# Patient Record
Sex: Female | Born: 1980 | Race: White | Hispanic: No | Marital: Married | Smoking: Former smoker
Health system: Southern US, Community
[De-identification: ages and names within clinical notes are randomized; demographics above are authoritative.]

## PROBLEM LIST (undated history)

## (undated) DIAGNOSIS — I10 Essential (primary) hypertension: Secondary | ICD-10-CM

## (undated) DIAGNOSIS — G43909 Migraine, unspecified, not intractable, without status migrainosus: Secondary | ICD-10-CM

## (undated) DIAGNOSIS — C801 Malignant (primary) neoplasm, unspecified: Secondary | ICD-10-CM

## (undated) DIAGNOSIS — E079 Disorder of thyroid, unspecified: Secondary | ICD-10-CM

## (undated) HISTORY — PX: CHOLECYSTECTOMY: SHX55

## (undated) HISTORY — PX: BREAST BIOPSY: SHX20

## (undated) HISTORY — PX: FRACTURE SURGERY: SHX138

## (undated) HISTORY — PX: NO PAST SURGERIES: SHX2092

## (undated) HISTORY — PX: ABDOMINAL HYSTERECTOMY: SHX81

## (undated) HISTORY — PX: ANKLE SURGERY: SHX546

## (undated) HISTORY — DX: Disorder of thyroid, unspecified: E07.9

## (undated) HISTORY — PX: SPINE SURGERY: SHX786

## (undated) HISTORY — PX: SHOULDER SURGERY: SHX246

## (undated) HISTORY — PX: CERVICAL SPINE SURGERY: SHX589

## (undated) HISTORY — PX: HX HYSTERECTOMY: SHX81

---
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
## 2006-09-25 HISTORY — PX: ANTERIOR FUSION CERVICAL SPINE: SUR626

## 2013-05-17 ENCOUNTER — Emergency Department (HOSPITAL_COMMUNITY)
Admission: EM | Admit: 2013-05-17 | Disposition: A | Discharge status: Home / Self Care | Payer: Medicaid - Out of State | Attending: Family | Admitting: Family

## 2013-05-17 ENCOUNTER — Emergency Department (EMERGENCY_DEPARTMENT_HOSPITAL): Payer: Medicaid - Out of State

## 2014-08-10 ENCOUNTER — Emergency Department (HOSPITAL_COMMUNITY): Payer: Self-pay | Admitting: Emergency Medicine

## 2014-08-14 ENCOUNTER — Encounter (HOSPITAL_COMMUNITY): Payer: Medicaid - Out of State

## 2014-08-14 DIAGNOSIS — M542 Cervicalgia: Secondary | ICD-10-CM

## 2014-08-14 DIAGNOSIS — M79602 Pain in left arm: Secondary | ICD-10-CM

## 2015-05-15 ENCOUNTER — Emergency Department (HOSPITAL_COMMUNITY): Payer: Self-pay | Admitting: EXTERNAL

## 2016-09-25 HISTORY — PX: ABDOMINAL HYSTERECTOMY: SHX81

## 2017-05-08 ENCOUNTER — Emergency Department
Admission: EM | Admit: 2017-05-08 | Discharge: 2017-05-08 | Disposition: A | Discharge status: Home / Self Care | Payer: BC Managed Care – PPO | Attending: Surgery | Admitting: Surgery

## 2017-05-08 ENCOUNTER — Encounter (HOSPITAL_COMMUNITY): Payer: Self-pay

## 2017-05-08 ENCOUNTER — Emergency Department (EMERGENCY_DEPARTMENT_HOSPITAL): Payer: BC Managed Care – PPO

## 2017-05-08 DIAGNOSIS — R200 Anesthesia of skin: Secondary | ICD-10-CM | POA: Insufficient documentation

## 2017-05-08 DIAGNOSIS — M542 Cervicalgia: Secondary | ICD-10-CM

## 2017-05-08 DIAGNOSIS — I10 Essential (primary) hypertension: Secondary | ICD-10-CM | POA: Insufficient documentation

## 2017-05-08 DIAGNOSIS — F17210 Nicotine dependence, cigarettes, uncomplicated: Secondary | ICD-10-CM | POA: Insufficient documentation

## 2017-05-08 DIAGNOSIS — R109 Unspecified abdominal pain: Secondary | ICD-10-CM | POA: Insufficient documentation

## 2017-05-08 DIAGNOSIS — Z79899 Other long term (current) drug therapy: Secondary | ICD-10-CM | POA: Insufficient documentation

## 2017-05-08 DIAGNOSIS — R32 Unspecified urinary incontinence: Secondary | ICD-10-CM | POA: Insufficient documentation

## 2017-05-08 DIAGNOSIS — Z981 Arthrodesis status: Secondary | ICD-10-CM | POA: Insufficient documentation

## 2017-05-08 DIAGNOSIS — R0602 Shortness of breath: Secondary | ICD-10-CM | POA: Insufficient documentation

## 2017-05-08 DIAGNOSIS — Z79891 Long term (current) use of opiate analgesic: Secondary | ICD-10-CM | POA: Insufficient documentation

## 2017-05-08 DIAGNOSIS — R112 Nausea with vomiting, unspecified: Secondary | ICD-10-CM | POA: Insufficient documentation

## 2017-05-08 DIAGNOSIS — T1490XA Injury, unspecified, initial encounter: Secondary | ICD-10-CM | POA: Diagnosis present

## 2017-05-08 HISTORY — DX: Essential (primary) hypertension: I10

## 2017-05-08 LAB — CBC WITH DIFF
BASOPHIL #: 0.06 x10ˆ3/uL (ref 0.00–0.20)
BASOPHIL %: 1 %
EOSINOPHIL #: 0.23 x10ˆ3/uL (ref 0.00–0.50)
EOSINOPHIL %: 2 %
EOSINOPHIL %: 2 %
HCT: 40.7 % (ref 33.5–45.2)
HGB: 14.1 g/dL (ref 11.2–15.2)
LYMPHOCYTE #: 3.05 x10ˆ3/uL (ref 1.00–4.80)
LYMPHOCYTE %: 30 %
MCH: 32.3 pg (ref 27.4–33.0)
MCHC: 34.7 g/dL (ref 32.5–35.8)
MCV: 93.3 fL (ref 78.0–100.0)
MONOCYTE #: 0.79 x10ˆ3/uL (ref 0.30–1.00)
MONOCYTE %: 8 %
MPV: 8.6 fL (ref 7.5–11.5)
NEUTROPHIL #: 6.22 x10ˆ3/uL (ref 1.50–7.70)
NEUTROPHIL %: 60 %
PLATELETS: 324 x10ˆ3/uL (ref 140–450)
RBC: 4.36 x10ˆ6/uL (ref 3.63–4.92)
RDW: 13.9 % (ref 12.0–15.0)
WBC: 10.3 x10ˆ3/uL (ref 3.5–11.0)

## 2017-05-08 LAB — B-TYPE NATRIURETIC PEPTIDE (BNP),PLASMA: BNP: 39 pg/mL (ref <=100)

## 2017-05-08 LAB — HCG, SERUM QUALITATIVE, PREGNANCY
PREGNANCY, SERUM QUALITATIVE: NEGATIVE
PREGNANCY, SERUM QUALITATIVE: NEGATIVE

## 2017-05-08 MED ORDER — TIZANIDINE 4 MG TABLET
4.00 mg | ORAL_TABLET | ORAL | Status: AC
Start: 2017-05-08 — End: 2017-05-08
  Administered 2017-05-08 (×2): 4 mg via ORAL
  Filled 2017-05-08: qty 1

## 2017-05-08 MED ORDER — ONDANSETRON HCL (PF) 4 MG/2 ML INJECTION SOLUTION
4.0000 mg | INTRAMUSCULAR | Status: AC
Start: 2017-05-08 — End: 2017-05-08
  Administered 2017-05-08: 4 mg via INTRAVENOUS
  Filled 2017-05-08: qty 2

## 2017-05-08 MED ORDER — DIAZEPAM 5 MG TABLET
5.00 mg | ORAL_TABLET | ORAL | Status: AC
Start: 2017-05-08 — End: 2017-05-08
  Administered 2017-05-08: 5 mg via ORAL
  Filled 2017-05-08: qty 1

## 2017-05-08 MED ORDER — SODIUM CHLORIDE 0.9 % IV BOLUS
1000.0000 mL | INJECTION | Status: AC
Start: 2017-05-08 — End: 2017-05-08
  Administered 2017-05-08: 0 mL via INTRAVENOUS
  Administered 2017-05-08: 1000 mL via INTRAVENOUS

## 2017-05-08 MED ORDER — DEXAMETHASONE SODIUM PHOSPHATE 10 MG/ML INJECTION SOLUTION
10.00 mg | INTRAMUSCULAR | Status: AC
Start: 2017-05-08 — End: 2017-05-08
  Administered 2017-05-08: 10 mg via INTRAVENOUS
  Filled 2017-05-08: qty 1

## 2017-05-08 MED ORDER — KETOROLAC 30 MG/ML (1 ML) INJECTION SOLUTION
30.00 mg | INTRAMUSCULAR | Status: AC
Start: 2017-05-08 — End: 2017-05-08
  Administered 2017-05-08: 30 mg via INTRAVENOUS
  Filled 2017-05-08: qty 1

## 2017-05-08 MED ORDER — MORPHINE 4 MG/ML INTRAVENOUS CARTRIDGE
4.00 mg | CARTRIDGE | INTRAVENOUS | Status: AC
Start: 2017-05-08 — End: 2017-05-08
  Administered 2017-05-08: 4 mg via INTRAVENOUS
  Filled 2017-05-08: qty 1

## 2017-05-08 MED ORDER — KETAMINE 10 MG/ML INJECTION WRAPPER
0.1500 mg/kg | Freq: Once | INTRAMUSCULAR | Status: AC
Start: 2017-05-08 — End: 2017-05-08
  Filled 2017-05-08: qty 3

## 2017-05-08 MED ADMIN — sodium chloride 0.9 % (flush) injection syringe: INTRAVENOUS | @ 09:00:00

## 2017-05-08 NOTE — Nurses Notes (Signed)
Pt cleared for mri . States she is unable to lay flat do to pain. Bedside RN aware and states will discuss with ordering service about premed

## 2017-05-08 NOTE — ED Nurses Note (Signed)
Patient family at nurses station again about patient severe pain. MD young notified again.

## 2017-05-08 NOTE — ED Nurses Note (Signed)
Aspen c-collar placed. Pt states, "I can't wear this. I am refusing to wear this collar. It is too painful." C-collar removed.

## 2017-05-08 NOTE — ED Nurses Note (Signed)
Pt cleared for MRI.

## 2017-05-08 NOTE — ED Nurses Note (Signed)
Trauma resident offered Lidocaine patch.
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
## 2017-05-08 NOTE — ED Nurses Note (Signed)
SBAR report received from A. Deem, RN. Assumed patient care at this time.

## 2017-05-08 NOTE — ED Nurses Note (Signed)
Patient reports no relief from pain with toradol. MD young made aware. Orders for ketamine given.

## 2017-05-08 NOTE — ED Nurses Note (Signed)
Trauma at bedside for consultation.
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
## 2017-05-08 NOTE — ED Nurses Note (Signed)
Pt refused EKG. RN offered to have ED manager speak with pt. ED Manager not available at this time. Pt advocate notified of need. Charge RN aware of pt complaint.
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
## 2017-05-08 NOTE — ED Nurses Note (Signed)
Manual BP taken. Trauma paged. Awaiting return call. C/o chest pain that is radiating down the left arm. Pt is angry. States, "Make sure you document that for when I DO die so you can cover your own butt."

## 2017-05-08 NOTE — ED Nurses Note (Signed)
Patient reporting pain 9/10. MD young made aware.

## 2017-05-08 NOTE — ED Nurses Note (Signed)
MD young at bedside to administer ketamine.
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
## 2017-05-08 NOTE — ED Nurses Note (Signed)
Trauma paged at pt request for Dilaudid for pain. New ice pack given for left hand. Pt laying with pillow propped under left back. Pillow and towel under neck. Husband at bedside.

## 2017-05-08 NOTE — ED Nurses Note (Signed)
Patient assisted to bathroom via wheelchair by husband.

## 2017-05-08 NOTE — ED Nurses Note (Signed)
1410: Patient advocate paged for services.

## 2017-05-08 NOTE — Nurses Notes (Signed)
Emergency Service paged for plan of completion for pt

## 2017-05-08 NOTE — ED Nurses Note (Signed)
Report given to Vernona RiegerLaura RN upon patient moved from 19 to CDU. All questions answered.
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
## 2017-05-08 NOTE — ED Nurses Note (Signed)
Pt was resting peacefully when moved from ED room 19. Ketamine administered prior to move.

## 2017-05-08 NOTE — ED Nurses Note (Signed)
MD young at bedside to evaluate the patient.

## 2017-05-08 NOTE — ED Nurses Note (Signed)
Trauma, Ortho, ED residents aware of pts subjective pain level. Pt aware she is awaiting MRI. No further needs at this time. States, "I just need something you won't give."
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
## 2017-05-08 NOTE — ED Nurses Note (Signed)
[facility]. Care assumed by Ronaldo MiyamotoKyle RN at this time.

## 2017-05-08 NOTE — ED Nurses Note (Signed)
Pt tearful and rolling around in bed. Blanket rolled and tucked under neck. Pillow given for head. Pillow placed under right side. Pt is right side laying. Cannot tolerate laying supine. Refuses any other position change. Thermawrap applied to neck. Cold pack applied to left hand with washcloth upon request. Husband at bedside is pacing and upset. Dr. Daphine DeutscherMartin aware.

## 2017-05-08 NOTE — ED Nurses Note (Signed)
ED Resident at bedside to assess pain.

## 2017-05-08 NOTE — ED Nurses Note (Signed)
Pt pulled IV from arm. Gathered up belongings and ambulated out of ED with husband. States, "I'm out of here. You aren't doing anything for me. I'm out." Trauma paged.

## 2017-05-08 NOTE — ED Nurses Note (Signed)
EKG tech called and notified of need for EKG.
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
## 2017-05-13 ENCOUNTER — Emergency Department (HOSPITAL_COMMUNITY): Payer: BC Managed Care – PPO

## 2017-05-13 ENCOUNTER — Emergency Department
Admission: EM | Admit: 2017-05-13 | Discharge: 2017-05-14 | Disposition: A | Discharge status: Home / Self Care | Payer: BC Managed Care – PPO | Attending: Emergency Medicine | Admitting: Emergency Medicine

## 2017-05-13 ENCOUNTER — Encounter (HOSPITAL_COMMUNITY): Payer: Self-pay

## 2017-05-13 DIAGNOSIS — M5410 Radiculopathy, site unspecified: Secondary | ICD-10-CM

## 2017-05-13 DIAGNOSIS — W1781XA Fall down embankment (hill), initial encounter: Secondary | ICD-10-CM | POA: Insufficient documentation

## 2017-05-13 DIAGNOSIS — W19XXXA Unspecified fall, initial encounter: Secondary | ICD-10-CM

## 2017-05-13 DIAGNOSIS — M5416 Radiculopathy, lumbar region: Secondary | ICD-10-CM | POA: Insufficient documentation

## 2017-05-13 DIAGNOSIS — Z79899 Other long term (current) drug therapy: Secondary | ICD-10-CM | POA: Insufficient documentation

## 2017-05-13 DIAGNOSIS — I10 Essential (primary) hypertension: Secondary | ICD-10-CM | POA: Insufficient documentation

## 2017-05-13 DIAGNOSIS — M545 Low back pain, unspecified: Secondary | ICD-10-CM

## 2017-05-13 DIAGNOSIS — S161XXA Strain of muscle, fascia and tendon at neck level, initial encounter: Secondary | ICD-10-CM

## 2017-05-13 LAB — URINALYSIS, MACRO/MICRO
BILIRUBIN: NEGATIVE mg/dL
BLOOD: NEGATIVE mg/dL
GLUCOSE: NEGATIVE mg/dL
KETONES: NEGATIVE mg/dL
LEUKOCYTES: NEGATIVE WBCs/uL
NITRITE: POSITIVE — AB
NITRITE: POSITIVE — AB
PH: 6.5 (ref 5.0–7.0)
PROTEIN: NEGATIVE mg/dL
SPECIFIC GRAVITY: 1.011 (ref 1.010–1.025)
UROBILINOGEN: 0.2 mg/dL

## 2017-05-13 LAB — BASIC METABOLIC PANEL
ANION GAP: 8 mmol/L
BUN/CREA RATIO: 22
BUN: 14 mg/dL (ref 10–25)
CALCIUM: 8.8 mg/dL (ref 8.2–10.2)
CHLORIDE: 105 mmol/L (ref 98–111)
CO2 TOTAL: 25 mmol/L (ref 21–35)
CREATININE: 0.63 mg/dL (ref <=1.30)
ESTIMATED GFR: >60 mL/min/1.73mˆ2
GLUCOSE: 85 mg/dL (ref 70–110)
POTASSIUM: 3.9 mmol/L (ref 3.5–5.0)
SODIUM: 138 mmol/L (ref 135–145)

## 2017-05-13 LAB — URINE DRUG SCREEN
AMPHETAMINES URINE: NEGATIVE
BARBITURATES URINE: NEGATIVE
CANNABINOIDS URINE: NEGATIVE
COCAINE METABOLITES URINE: NEGATIVE
OPIATES URINE: NEGATIVE
PCP URINE: NEGATIVE
PCP URINE: NEGATIVE

## 2017-05-13 LAB — CBC WITH DIFF
BASOPHIL #: 0.1 10*3/uL (ref 0.00–0.20)
BASOPHIL %: 1 %
EOSINOPHIL #: 0.3 10*3/uL (ref 0.00–0.50)
EOSINOPHIL %: 2 %
HCT: 38 % (ref 34.6–46.2)
HGB: 12.8 g/dL (ref 11.8–15.8)
LYMPHOCYTE #: 3.8 10*3/uL — ABNORMAL HIGH (ref 0.90–3.40)
LYMPHOCYTE %: 23 %
LYMPHOCYTE %: 23 %
MCH: 32.1 pg (ref 27.6–33.2)
MCHC: 33.7 g/dL (ref 32.6–35.4)
MCV: 95.5 fL (ref 82.3–96.7)
MCV: 95.5 fL (ref 82.3–96.7)
MONOCYTE #: 0.8 10*3/uL (ref 0.20–0.90)
MONOCYTE #: 0.8 10*3/uL (ref 0.20–0.90)
MONOCYTE %: 5 %
MPV: 8.9 fL (ref 6.6–10.2)
NEUTROPHIL #: 12 10*3/uL — ABNORMAL HIGH (ref 1.50–6.40)
NEUTROPHIL %: 70 %
PLATELETS: 297 10*3/uL (ref 140–440)
RBC: 3.98 10*6/uL (ref 3.80–5.24)
RBC: 3.98 x10?6/uL (ref 3.80–5.24)
RDW: 14.2 % (ref 12.4–15.2)
WBC: 17.1 10*3/uL — ABNORMAL HIGH (ref 3.5–10.3)

## 2017-05-13 LAB — URINE DRUG SCREEN-PAIN CLINIC (NO THC): BENZODIAZEPINES URINE: NEGATIVE

## 2017-05-13 LAB — ETHANOL, SERUM/PLASMA: ETHANOL: <10 mg/dL (ref 0–10)

## 2017-05-13 MED ORDER — METHYLPREDNISOLONE 4 MG TABLETS IN A DOSE PACK: Tab | ORAL | 0 refills | 0 days | Status: AC

## 2017-05-13 MED ORDER — LIDOCAINE 4 % TOPICAL PATCH
1.00 | MEDICATED_PATCH | CUTANEOUS | Status: DC
Start: 2017-05-13 — End: 2017-05-13

## 2017-05-13 MED ORDER — IBUPROFEN 600 MG TABLET: 600 mg | Tab | Freq: Four times a day (QID) | ORAL | 0 refills | 0 days | Status: AC | PRN

## 2017-05-13 MED ORDER — FENTANYL (PF) 50 MCG/ML INJECTION SOLUTION
25.00 ug | INTRAMUSCULAR | Status: AC
Start: 2017-05-13 — End: 2017-05-13
  Administered 2017-05-13: 25 ug via INTRAVENOUS
  Filled 2017-05-13: qty 2

## 2017-05-13 MED ORDER — ONDANSETRON HCL (PF) 4 MG/2 ML INJECTION SOLUTION
4.00 mg | INTRAMUSCULAR | Status: AC
Start: 2017-05-14 — End: 2017-05-13
  Administered 2017-05-13: 4 mg via INTRAVENOUS
  Filled 2017-05-13: qty 2

## 2017-05-13 MED ORDER — LIDOCAINE 5 % TOPICAL PATCH
1.0000 | MEDICATED_PATCH | Freq: Once | CUTANEOUS | Status: DC
Start: 2017-05-14 — End: 2017-05-14
  Administered 2017-05-13: 700 mg via TRANSDERMAL
  Filled 2017-05-13: qty 1

## 2017-05-13 MED ORDER — FENTANYL (PF) 50 MCG/ML INJECTION SOLUTION
25.00 ug | INTRAMUSCULAR | Status: AC
Start: 2017-05-13 — End: 2017-05-13
  Administered 2017-05-13: 25 ug via INTRAVENOUS

## 2017-05-13 MED ORDER — KETOROLAC 30 MG/ML (1 ML) INJECTION SOLUTION
30.0000 mg | INTRAMUSCULAR | Status: AC
Start: 2017-05-13 — End: 2017-05-13
  Administered 2017-05-13: 30 mg via INTRAVENOUS
  Filled 2017-05-13: qty 1

## 2017-05-13 MED ORDER — GABAPENTIN 300 MG CAPSULE: Cap | ORAL | 0 refills | 0 days | Status: AC

## 2017-05-13 MED ORDER — METHOCARBAMOL 100 MG/ML INJECTION SOLUTION
1.00 g | INTRAMUSCULAR | Status: AC
Start: 2017-05-13 — End: 2017-05-13
  Administered 2017-05-13: 1000 mg via INTRAVENOUS
  Filled 2017-05-13: qty 1

## 2017-05-13 MED ORDER — CYCLOBENZAPRINE 5 MG TABLET: 5 mg | Tab | Freq: Three times a day (TID) | ORAL | 0 refills | 0 days | Status: DC | PRN

## 2017-05-13 MED ADMIN — ketorolac 30 mg/mL (1 mL) injection solution: INTRAVENOUS | @ 22:00:00

## 2017-05-13 MED ADMIN — fentaNYL (PF) 50 mcg/mL injection solution: INTRAVENOUS | @ 21:00:00

## 2017-05-13 MED ADMIN — sodium chloride 0.9 % (flush) injection syringe: INTRAVENOUS

## 2017-05-13 NOTE — ED Nurses Note (Signed)
pt resting eyes closed.

## 2017-05-13 NOTE — ED Provider Notes (Signed)
Department of Emergency Medicine  [facility]        HPI: Patient is a 36 y.o.  female presenting to the ED with chief complaint of fall from standing on a hill with low back pain. Sipped, did not have dizziness or syncope leading to fall. Landed on her back on a steep embankment, but did not roll/fall down a significant height. Complains of pain to entire back. Complains that it feels like ants are biting entire BLE and buttocks. Received 100 mcg fentanyl en route. Denies LOC. Denies CP, SOB, abdominal pain. Reports history of prior back fractures "in three places" including lumbar spine. Had C-spine surgery, but not lumbar surgery. Has chronic neck pain and back pain, but pain feels worse than baseline. Denies intoxication. Denies incontinence. Has not ambulated or voided since the fall.      Review of Systems:     Constitutional: No fever, chills  Skin: No rashes or lesions  HENT: No sore throat, ear pain, or difficulty swallowing  Eyes: No vision changes, redness, discharge  Cardio: No chest pain, palpitations   Respiratory: No cough, wheezing or SOB  GI:  No nausea or vomiting. No diarrhea or constipation. No abdominal pain  GU:  No dysuria, hematuria, polyuria  MSK: + Back pain, neck pain  Neuro: Not numbness but + BLE tingling/biting pain.  No headache  Psych: No SI or HI. Denies substance abuse.     All other symptoms reviewed and are negative, unless commented on in the HPI.     Past Medical History:  Past Medical History:   Diagnosis Date   . HTN (hypertension)          Past Surgical History:    Past Surgical History:   Procedure Laterality Date   . CERVICAL SPINE SURGERY     . HX HYSTERECTOMY           Allergies:  Allergies   Allergen Reactions   . Aspirin Anaphylaxis     Medications:    Current Outpatient Prescriptions   Medication Sig   . atenolol (TENORMIN) 50 mg Oral Tablet Take 50 mg by mouth Once a day   . lisinopril (PRINIVIL) 20 mg Oral Tablet Take 20 mg by mouth Once a day     Social  History:    Social History   Substance Use Topics   . Smoking status: Never Smoker   . Smokeless tobacco: Never Used   . Alcohol use 1.2 oz/week     2 Glasses of wine per week     Family History: No significant family history.  Family Medical History     None              PMH, PSH, medications, allergies, SH, and FH per medical record and reviewed with patient. Important aspects of these fields pertaining to today's visit taken into consideration during history/physical and MDM.    ED Triage Vitals   Enc Vitals Group      BP (Non-Invasive) 05/13/17 2000 149/89      Heart Rate 05/13/17 2000 97      Respiratory Rate 05/13/17 2000 18      Temperature 05/13/17 2000 36.9 C (98.4 F)      Temp src --       SpO2-1 05/13/17 2000 99 %      Weight 05/13/17 2000 97.5 kg (215 lb)      Height 05/13/17 2000 1.753 m (5\' 9" )      Head  Cir --       Peak Flow --       Pain Score --       Pain Loc --       Pain Edu? --       Excl. in [place]? --      Filed Vitals:    05/13/17 2000   BP: (!) 149/89   Pulse: 97   Resp: 18   Temp: 36.9 C (98.4 F)   SpO2: 99%       Physical Exam:     Nursing note and vitals reviewed.  Vital signs reviewed as above. Moaning.   Constitutional: Pt is well-developed and well-nourished.   Head: Normocephalic and atraumatic.   Eyes: Conjunctivae are normal. Pupils are equal, round, and reactive to light. EOM are intact  Neck: + Midline c-spine TTP.   Cardiovascular: RRR.  No Murmurs/rubs/gallops. Distal pulses present and equal bilaterally.  Pulmonary/Chest: Normal BS BL with no distress. No audible wheezes or crackles are noted.  Abdominal: Soft, nontender, nondistended.  No rebound, guarding, or masses.  Musculoskeletal: Normal range of motion of extremities. No deformities.  Exhibits no edema. + Lumbar midline TTP. Internal/external hip rotation without pain.   Neurological: Preferring to keep eyes closed. Moaning, somewhat slow/mumbling/slurred speech. Oriented x3. Face symmetric. 5/5 strength and sensation  intact in bilateral radial, median, and ulnar distribution. 5/5 hip flexion bilaterally. 5/5 knee extension. 5/5 foot dorsi and plantarflexion bilaterally. SILT BLE, but states tingling/pain. Normal rectal tone. Patellar reflexes intact.   Skin: Warm and dry. No rash or lesions  Psychiatric: Patient has exaggerated affect.     Workup:   Orders Placed This Encounter   . CANCELED: URINE CULTURE,ROUTINE   . CT BRAIN WO IV CONTRAST   . CT CERVICAL SPINE WO IV CONTRAST   . CT LUMBAR SPINE WO IV CONTRAST   . BASIC METABOLIC PANEL   . CBC/DIFF   . URINALYSIS WITH REFLEX MICROSCOPIC AND CULTURE IF POSITIVE   . URINE DRUG SCREEN   . CBC WITH DIFF   . URINALYSIS, MACRO/MICRO   . ETHANOL, SERUM   . Refer to [facility]   . ECG 12 LEAD - ED USE   . CANCELED: ECG 12 LEAD - ED USE   . fentaNYL (SUBLIMAZE) 50 mcg/mL injection   . ketorolac (TORADOL) 30 mg/mL injection   . fentaNYL (SUBLIMAZE) 50 mcg/mL injection   . methocarbamol (ROBAXIN) 100 mg/mL injection   . ondansetron (ZOFRAN) 2 mg/mL injection   . gabapentin (NEURONTIN) 300 mg Oral Capsule   . Methylprednisolone (MEDROL DOSEPACK) 4 mg Oral Tablets, Dose Pack   . Ibuprofen (MOTRIN) 600 mg Oral Tablet   . morphine 2 mg/mL injection   . lidocaine (LIDODERM) 5 % Adhesive Patch, Medicated   . traMADol (ULTRAM) 50 mg Oral Tablet   . methocarbamol (ROBAXIN) 500 mg Oral Tablet         ECG: NSR       Imaging:      Results for orders placed or performed during the hospital encounter of 05/13/17   CT BRAIN WO IV CONTRAST     Status: None    Narrative    Meliss Plude    PROCEDURE DESCRIPTION: CT BRAIN WO IV CONTRAST    PROCEDURE PERFORMED DATE AND TIME: 05/13/2017 10:13 PM    CT DOSE INFORMATION:  DLP (mGycm):  1155    CLINICAL INDICATION: fall on hill, somewhat slurred speech    COMPARISON: No prior studies were  compared.      FINDINGS: Brain volume, particular size and sulcal pattern are within  normal range. No hydrocephalus, intracranial hemorrhage, mass effect or  midline shift.  Patent basal cisterns.    The temporal bone structures and paranasal sinuses are largely clear. No  depressed or widely separated calvarial fracture.      Impression    1. No intracranial hemorrhage or mass effect.            The CT exam was performed using one or more the following a dose reduction  techniques: Automated exposure control, adjustment of the mA and/or kV  according to the patient's size, or use of iterative reconstruction  technique.     CT CERVICAL SPINE WO IV CONTRAST     Status: None    Narrative    Nastasia Fizer    PROCEDURE DESCRIPTION: CT CERVICAL SPINE WO IV CONTRAST    PROCEDURE PERFORMED DATE AND TIME: 05/13/2017 10:17 PM    CT DOSE INFORMATION:  DLP (mGycm):  539    CLINICAL INDICATION: fall on hill, neck pain    COMPARISON: No prior studies were compared.      FINDINGS: Postoperative changes status post C3-C4 anterior cervical  discectomy and fusion. Reversal of the normal cervical lordosis, stable  from x-ray cervical spine 05/17/2013. Multilevel cervical spine degenerative  changes with associated endplate osteophytes, loss of intervertebral disc  space height and facet arthropathy. No evidence for acute fracture or  traumatic malalignment.      Impression    1. Postoperative and degenerative changes cervical spine without acute  fracture or malalignment.  2. Patchy nonspecific airspace opacities within the bilateral upper lobes,  greater on the right.            The CT exam was performed using one or more the following a dose reduction  techniques: Automated exposure control, adjustment of the mA and/or kV  according to the patient's size, or use of iterative reconstruction  technique.     CT LUMBAR SPINE WO IV CONTRAST     Status: None    Narrative    Lindsee Cumberland    PROCEDURE DESCRIPTION: CT LUMBAR SPINE WO IV CONTRAST    PROCEDURE PERFORMED DATE AND TIME: 05/13/2017 10:21 PM    CT DOSE INFORMATION:  DLP (mGycm):  2439    CLINICAL INDICATION: fall on hill, lumbar back pain with pins/needles  to  entire BLE and buttock    COMPARISON: No prior studies were compared.      FINDINGS: Mild L4 vertebral body height loss and superior endplate  irregularity. Approximately 25% height loss L4 superior endplate  compression fracture.    Moderate L3 compression fracture deformity with associated superior  endplate irregularity. Approximately 50% height loss L3 vertebral body.    The L3 and L4 compression fracture deformities are not associated with  prevertebral or paravertebral soft tissue swelling, the fractures may be  chronic in nature. No significant bony canal stenosis at L4 or L3.    L2-L3: Mild disc bulge and facet arthropathy. Minimal effacement of the  thecal sac. No significant neural foraminal narrowing.    L3-L4: Mild disc bulge and facet arthropathy. Mild central canal stenosis  and neural foraminal narrowing.    L4-L5: Mild disc bulge. Minimal effacement of ventral thecal sac.    Fecal retention within the colon.      Impression    1. L3 and L4 compression fracture deformities, favor a chronic compression  fracture deformities.  Correlation with any prior imaging of the lumbar  spine recommended. No definite secondary signs to suggest acute fractures.  If the clinical suspicion for acute L3 or L4 compression fracture remains,  further assessment with MRI may be helpful to assess for bone marrow edema.            The CT exam was performed using one or more the following a dose reduction  techniques: Automated exposure control, adjustment of the mA and/or kV  according to the patient's size, or use of iterative reconstruction  technique.             Abnormal Lab results:  Labs Reviewed   CBC WITH DIFF - Abnormal; Notable for the following:        Result Value    WBC 17.1 (*)     NEUTROPHIL # 12.00 (*)     LYMPHOCYTE # 3.80 (*)     All other components within normal limits   URINALYSIS, MACRO/MICRO - Abnormal; Notable for the following:     NITRITE Positive (*)     RBCS 0-2 (*)     All other components  within normal limits   URINE DRUG SCREEN - Normal    Narrative:     Unconfirmed screening results must not be used for non-medical purposes   (e.g. employment testing, legal testing, etc.)    Cut-off values are listed below:  Amphetamines - < 1000 ng/mL  Barbiturates - < 200 ng/mL  Benzodiazepines - < 200 ng/mL  Cocaine - < 300 ng/mL  Opiate - < 300 ng/mL  Oxycodone - < 100 ng/mL  PCP - < 25 ng/mL  THC5 - < 50 ng/mL  Buprenorphine - < 10 ng/mL  Methadone - < 300 ng/mL   ETHANOL, SERUM - Normal   BASIC METABOLIC PANEL   CBC/DIFF    Narrative:     The following orders were created for panel order CBC/DIFF.  Procedure                               Abnormality         Status                     ---------                               -----------         ------                     CBC WITH DIFF[[id number]]                Abnormal            Final result                 Please view results for these tests on the individual orders.   URINALYSIS WITH REFLEX MICROSCOPIC AND CULTURE IF POSITIVE    Narrative:     The following orders were created for panel order URINALYSIS WITH REFLEX MICROSCOPIC AND CULTURE IF POSITIVE.  Procedure                               Abnormality         Status                     ---------                               -----------         ------  URINALYSIS, MACRO/MICRO[[id number]]      Abnormal            Final result                 Please view results for these tests on the individual orders.           Plan: Appropriate labs and imaging ordered. Medical Records reviewed.    MDM:   During the patient's stay in the emergency department, the above listed imaging and/or labs were performed to assist with medical decision making and were reviewed by myself when available for review.   Pt remained stable throughout the emergency department course.    Pt with full strength on exam, intact rectal tone and perirectal sensation, not concerning for cauda equina.   Pins and needles pain  location does not follow a particular nerve root distribution.   Due to somewhat slurred speech, CT brain obtained, negative. I suspect this was due to receiving analgesia.   Pt was able to ambulate in the ED.  Due to continued c-spine TTP, pt is to wear c-collar and follow up in 2 weeks for flexion and extension XR to eval for ligamentous injury.     Disposition:   Discharged    Impression:   Encounter Diagnoses   Name Primary?   . Cervical strain Yes   . Lumbar back pain    . Radicular pain          Discharge:  Following the above history, physical exam, and studies, the patient was deemed stable and suitable for discharge. Medication instructions were discussed with the patient. The patient verbalized understanding of all instructions and had no further questions or concerns. Patient was advised to return to the ED for any new or worsening symptoms.     Follow Up:   Mackey Birchwood, MD  [address]  [address]  [place] [address]  [phone]          [facility] MEDICINE [facility]  [address]  [place] [address]  [phone]    As needed    Medications Prescribed:   Discharge Medication List as of 05/14/2017 12:29 AM      START taking these medications    Details   gabapentin (NEURONTIN) 300 mg Oral Capsule Take one tablet (300 mg) by mouth for one day. Then take one tablet (300 mg) by mouth twice a day for one day. Then take one tablet (300 mg) by mouth three times a day for three weeks. Then take one tablet (300 mg) by mouth twice a day for two days. Then  take one tablet (300 mg) by mouth once a day for two days.    Dispense 72 tablets., Disp-72 Cap, R-0, Print      Ibuprofen (MOTRIN) 600 mg Oral Tablet Take 1 Tab (600 mg total) by mouth Four times a day as needed for Pain, Disp-90 Tab, R-0, Print      lidocaine (LIDODERM) 5 % Adhesive Patch, Medicated 1 Patch (700 mg total) by Transdermal route Once a day, Disp-3 Patch, R-3, Print      methocarbamol (ROBAXIN) 500 mg  Oral Tablet Take 2 Tabs (1,000 mg total) by mouth Four times a day for 10 days, Disp-80 Tab, R-0, Print      Methylprednisolone (MEDROL DOSEPACK) 4 mg Oral Tablets, Dose Pack Take as instructed., Disp-21 Tab, R-0, Print      traMADol (ULTRAM) 50 mg Oral Tablet Take 1 Tab (  50 mg total) by mouth Every 6 hours as needed for Pain, Disp-20 Tab, R-0, Print

## 2017-05-13 NOTE — ED Nurses Note (Signed)
PT presented to the ED via [facility] with CC of slip and fall from standing while fishing on [place]side. Pt sts she broke her back in 3 places back in October and the pain meds did not work that EMS gave her. All extremities are in working order with c/o "feels like ants are biting my legs". Tele, oximeter, and BP applied and will continue to monitor.

## 2017-05-14 LAB — ECG 12 LEAD - ED USE
Calculated P Axis: 39 deg
Calculated T Axis: 37 deg
EKG Severity: NORMAL
Heart Rate: 85 {beats}/min
Heart Rate: 85 {beats}/min
I 40 Axis: 43 deg
PR Interval: 147 ms
QRS Axis: 8 deg
QRS Duration: 95 ms
QT Interval: 394 ms
QTC Calculation: 469 ms
ST Axis: 60 deg
T 40 Axis: -41 deg
T 40 Axis: -41 deg

## 2017-05-14 MED ORDER — OXYCODONE-ACETAMINOPHEN 5 MG-325 MG TABLET - ED TO GO MED
1.00 | ORAL_TABLET | Freq: Four times a day (QID) | ORAL | Status: DC | PRN
Start: 2017-05-14 — End: 2017-05-14
  Administered 2017-05-14: 1 via ORAL
  Filled 2017-05-14: qty 4

## 2017-05-14 MED ORDER — TRAMADOL 50 MG TABLET
1.00 | ORAL_TABLET | Freq: Four times a day (QID) | ORAL | 0 refills | Status: AC | PRN
Start: 2017-05-14 — End: ?

## 2017-05-14 MED ORDER — LIDOCAINE 5 % TOPICAL PATCH
1.0000 | MEDICATED_PATCH | Freq: Every day | CUTANEOUS | 3 refills | Status: AC
Start: 2017-05-14 — End: ?

## 2017-05-14 MED ORDER — METHOCARBAMOL 500 MG TABLET: 1000 mg | Tab | Freq: Four times a day (QID) | ORAL | 0 refills | 0 days | Status: AC

## 2017-05-14 MED ORDER — MORPHINE 2 MG/ML INTRAVENOUS CARTRIDGE
2.00 mg | CARTRIDGE | INTRAVENOUS | Status: AC
Start: 2017-05-14 — End: 2017-05-14
  Administered 2017-05-14: 2 mg via INTRAVENOUS
  Filled 2017-05-14: qty 1

## 2017-05-14 NOTE — ED Nurses Note (Signed)
assisted pt with getting dressed.  Pt was able to move and bend legs easily to put on pants and shoes.  Pt able to stand and move, pt sat in wheelchair.

## 2017-05-14 NOTE — Discharge Instructions (Signed)
Wear your cervical collar for the next 2 weeks. Follow up with Dr. Arrie Aran. Call to make an appointment. Take medications as prescribed. These medications can make you drowsy, so don't drive while taking them.

## 2017-05-14 NOTE — ED Nurses Note (Signed)
Pt given Percocet to take at home.  Pt also given prescriptions and discharge instructions

## 2017-05-14 NOTE — ED Nurses Note (Signed)
Pt taken to out to car in wheelchair.  Pt stood up and got in without difficulty.  pt taken home by husband.  [id number]

## 2017-05-16 LAB — URINE CULTURE,ROUTINE: URINE CULTURE: NO GROWTH

## 2017-05-17 ENCOUNTER — Ambulatory Visit (INDEPENDENT_AMBULATORY_CARE_PROVIDER_SITE_OTHER): Payer: Self-pay | Admitting: Physical Medicine & Rehabilitation

## 2018-03-21 ENCOUNTER — Emergency Department: Payer: BLUE CROSS/BLUE SHIELD

## 2018-03-21 ENCOUNTER — Encounter: Payer: Self-pay | Admitting: Emergency Medicine

## 2018-03-21 ENCOUNTER — Observation Stay
Admission: EM | Admit: 2018-03-21 | Discharge: 2018-03-22 | Discharge status: Against Medical Advice | Payer: BLUE CROSS/BLUE SHIELD | Attending: Internal Medicine | Admitting: Internal Medicine

## 2018-03-21 DIAGNOSIS — I10 Essential (primary) hypertension: Secondary | ICD-10-CM | POA: Diagnosis present

## 2018-03-21 DIAGNOSIS — R51 Headache: Secondary | ICD-10-CM | POA: Diagnosis present

## 2018-03-21 DIAGNOSIS — F17200 Nicotine dependence, unspecified, uncomplicated: Secondary | ICD-10-CM | POA: Diagnosis not present

## 2018-03-21 DIAGNOSIS — R519 Headache, unspecified: Secondary | ICD-10-CM | POA: Diagnosis present

## 2018-03-21 DIAGNOSIS — R52 Pain, unspecified: Secondary | ICD-10-CM | POA: Diagnosis present

## 2018-03-21 HISTORY — DX: Essential (primary) hypertension: I10

## 2018-03-21 HISTORY — DX: Malignant (primary) neoplasm, unspecified: C80.1

## 2018-03-21 LAB — BASIC METABOLIC PANEL
Anion gap: 9 (ref 5–15)
BUN: 12 mg/dL (ref 6–20)
CO2: 24 mmol/L (ref 22–32)
Calcium: 7.8 mg/dL — ABNORMAL LOW (ref 8.9–10.3)
Chloride: 106 mmol/L (ref 98–111)
Creatinine, Ser: 0.73 mg/dL (ref 0.44–1.00)
GFR calc Af Amer: >60 mL/min (ref >60)
GFR calc non Af Amer: >60 mL/min (ref >60)
Glucose, Bld: 94 mg/dL (ref 70–99)
Potassium: 3.3 mmol/L — ABNORMAL LOW (ref 3.5–5.1)
Sodium: 139 mmol/L (ref 135–145)

## 2018-03-21 LAB — CBC WITH DIFFERENTIAL/PLATELET
Basophils Absolute: 0.1 10*3/uL (ref 0–0.1)
Basophils Relative: 1 %
Eosinophils Absolute: 0.3 10*3/uL (ref 0–0.7)
Eosinophils Relative: 2 %
HCT: 41.2 % (ref 35.0–47.0)
Hemoglobin: 13.9 g/dL (ref 12.0–16.0)
Lymphocytes Relative: 27 %
Lymphs Abs: 4.5 10*3/uL — ABNORMAL HIGH (ref 1.0–3.6)
MCH: 33.9 pg (ref 26.0–34.0)
MCHC: 33.7 g/dL (ref 32.0–36.0)
MCV: 100.8 fL — ABNORMAL HIGH (ref 80.0–100.0)
Monocytes Absolute: 1.1 10*3/uL — ABNORMAL HIGH (ref 0.2–0.9)
Monocytes Relative: 7 %
Neutro Abs: 10.4 10*3/uL — ABNORMAL HIGH (ref 1.4–6.5)
Neutrophils Relative %: 63 %
Platelets: 294 10*3/uL (ref 150–440)
RBC: 4.09 MIL/uL (ref 3.80–5.20)
RDW: 13.9 % (ref 11.5–14.5)
WBC: 16.4 10*3/uL — ABNORMAL HIGH (ref 3.6–11.0)

## 2018-03-21 MED ORDER — MAGNESIUM SULFATE 2 GM/50ML IV SOLN
2.0000 g | Freq: Once | INTRAVENOUS | Status: AC
  Administered 2018-03-21: 2 g via INTRAVENOUS
  Filled 2018-03-21: qty 50

## 2018-03-21 MED ORDER — SODIUM CHLORIDE 0.9 % IV BOLUS
1000.0000 mL | Freq: Once | INTRAVENOUS | Status: AC
Start: 2018-03-21 — End: 2018-03-21
  Administered 2018-03-21: 1000 mL via INTRAVENOUS

## 2018-03-21 MED ORDER — PROCHLORPERAZINE EDISYLATE 10 MG/2ML IJ SOLN: 10.0000 mg | Freq: Once | INTRAMUSCULAR | Status: DC

## 2018-03-21 MED ORDER — MORPHINE SULFATE (PF) 4 MG/ML IV SOLN
4.0000 mg | Freq: Once | INTRAVENOUS | Status: AC
  Administered 2018-03-21: 4 mg via INTRAVENOUS
  Filled 2018-03-21: qty 1

## 2018-03-21 MED ORDER — DIPHENHYDRAMINE HCL 50 MG/ML IJ SOLN: 25.0000 mg | Freq: Once | INTRAMUSCULAR | Status: DC

## 2018-03-21 MED ORDER — PROCHLORPERAZINE EDISYLATE 10 MG/2ML IJ SOLN
10.0000 mg | Freq: Once | INTRAMUSCULAR | Status: AC
  Administered 2018-03-21: 10 mg via INTRAVENOUS
  Filled 2018-03-21: qty 2

## 2018-03-21 MED ORDER — LORAZEPAM 2 MG/ML IJ SOLN
1.0000 mg | Freq: Once | INTRAMUSCULAR | Status: AC
  Administered 2018-03-21: 1 mg via INTRAVENOUS
  Filled 2018-03-21: qty 1

## 2018-03-21 MED ORDER — DEXAMETHASONE SODIUM PHOSPHATE 10 MG/ML IJ SOLN
10.0000 mg | Freq: Once | INTRAMUSCULAR | Status: AC
  Administered 2018-03-21: 10 mg via INTRAVENOUS
  Filled 2018-03-21: qty 1

## 2018-03-21 MED ORDER — IOHEXOL 350 MG/ML SOLN
75.0000 mL | Freq: Once | INTRAVENOUS | Status: AC | PRN
  Administered 2018-03-21: 75 mL via INTRAVENOUS
  Filled 2018-03-21: qty 75

## 2018-03-21 MED ORDER — DIPHENHYDRAMINE HCL 50 MG/ML IJ SOLN
25.0000 mg | Freq: Once | INTRAMUSCULAR | Status: AC
  Administered 2018-03-21: 25 mg via INTRAVENOUS
  Filled 2018-03-21: qty 1

## 2018-03-21 MED ORDER — GABAPENTIN 600 MG PO TABS
300.0000 mg | ORAL_TABLET | Freq: Once | ORAL | Status: AC
  Administered 2018-03-21: 300 mg via ORAL
  Filled 2018-03-21: qty 0.5
  Filled 2018-03-21: qty 1

## 2018-03-21 MED ORDER — SODIUM CHLORIDE 0.9 % IV BOLUS: 1000.0000 mL | Freq: Once | INTRAVENOUS | Status: DC

## 2018-03-21 MED ORDER — METOCLOPRAMIDE HCL 5 MG/ML IJ SOLN
10.0000 mg | Freq: Once | INTRAMUSCULAR | Status: AC
  Administered 2018-03-21: 10 mg via INTRAVENOUS
  Filled 2018-03-21: qty 2

## 2018-03-21 NOTE — ED Provider Notes (Addendum)
[facility] Emergency Department Provider Note ____________________________________________   First MD Initiated Contact with Patient 03/21/18 1932     (approximate)  I have reviewed the triage vital signs and the nursing notes.   HISTORY  Chief Complaint Headache  HPI Hannah Ballard is a 37 y.o. female with a history of "a plate in my neck" who is presenting to the emergency department today with a left-sided headache.  She says that she has had a headache for the past 2 to 3 days but it went away yesterday.  However, she says that she woke up in discomfort at about 3 AM and the headache became worse to a about a 7 out of 10 at approximately 5 AM.  The patient says that she has had nausea, photophobia and has blurred vision in her left eye.  She does not have a family history of migraine nor does she have a family history of cerebral aneurysms.  She denies any neck pain.  Does not report fever.  Says that she had a root canal to a left maxillary molar several weeks ago but has not had pain or difficulty with this since.  She describes the pain is persistently a 7 out of 10 and feels like a shooting and throbbing pain to the back of her head/occiput to the left temple.  Past Medical History:  Diagnosis Date  . Cancer ([place])   . Hypertension     There are no active problems to display for this patient.   History reviewed. No pertinent surgical history.  Prior to Admission medications   Not on File    Allergies Aspirin  History reviewed. No pertinent family history.  Social History Social History   Tobacco Use  . Smoking status: Current Every Day Smoker  . Smokeless tobacco: Never Used  Substance Use Topics  . Alcohol use: Not on file  . Drug use: Not on file    Review of Systems  Constitutional: No fever/chills Eyes: As above ENT: No sore throat. Cardiovascular: Denies chest pain. Respiratory: Denies shortness of breath. Gastrointestinal: No  abdominal pain.  No diarrhea.  No constipation. Genitourinary: Negative for dysuria. Musculoskeletal: Negative for back pain. Skin: Negative for rash. Neurological: Negative for focal weakness or numbness.   ____________________________________________   PHYSICAL EXAM:  VITAL SIGNS: ED Triage Vitals  Enc Vitals Group     BP 03/21/18 1918 (!) 157/94     Pulse Rate 03/21/18 1918 (!) 105     Resp 03/21/18 1918 18     Temp 03/21/18 1918 98.2 F (36.8 C)     Temp Source 03/21/18 1918 Oral     SpO2 03/21/18 1918 99 %     Weight 03/21/18 1919 220 lb (99.8 kg)     Height 03/21/18 1919 5\' 9"  (1.753 m)     Head Circumference --      Peak Flow --      Pain Score --      Pain Loc --      Pain Edu? --      Excl. in [place]? --     Constitutional: Alert and oriented. Well appearing and in no acute distress. Eyes: Conjunctivae are normal.  Head: Atraumatic.  Tenderness to palpation along the distribution of the lesser occipital nerve and greater auricular nerve.  Left TM is normal.  There is no postauricular edema, erythema or displacement of the auricle. Nose: No congestion/rhinnorhea. Mouth/Throat: Mucous membranes are moist.  No swelling, grossly nor on  examination of the teeth or mouth.  No trismus.  No tenderness along the maxilla nor is there tenderness along the mandible. Neck: No stridor.   Cardiovascular: Normal rate, regular rhythm. Grossly normal heart sounds.   Respiratory: Normal respiratory effort.  No retractions. Lungs CTAB. Gastrointestinal: Soft and nontender. No distention.  Musculoskeletal: No lower extremity tenderness nor edema.  No joint effusions. Neurologic:  Normal speech and language. No gross focal neurologic deficits are appreciated. Skin:  Skin is warm, dry and intact. No rash noted. Psychiatric: Mood and affect are normal. Speech and behavior are normal.  ____________________________________________   LABS (all labs ordered are listed, but only abnormal  results are displayed)  Labs Reviewed  CBC WITH DIFFERENTIAL/PLATELET - Abnormal; Notable for the following components:      Result Value   WBC 16.4 (*)    MCV 100.8 (*)    Neutro Abs 10.4 (*)    Lymphs Abs 4.5 (*)    Monocytes Absolute 1.1 (*)    All other components within normal limits  BASIC METABOLIC PANEL - Abnormal; Notable for the following components:   Potassium 3.3 (*)    Calcium 7.8 (*)    All other components within normal limits   ____________________________________________  EKG   ____________________________________________  RADIOLOGY  CT noncontrast as well as CT angiography without acute process. ____________________________________________   PROCEDURES  Procedure(s) performed:   Angiocath insertion Performed by: Doran Stabler  Consent: Verbal consent obtained. Risks and benefits: risks, benefits and alternatives were discussed Time out: Immediately prior to procedure a "time out" was called to verify the correct patient, procedure, equipment, support staff and site/side marked as required.  Preparation: Patient was prepped and draped in the usual sterile fashion.  Vein Location: left basilic  Ultrasound Guided  Gauge: 18  Normal blood return and flush without difficulty Patient tolerance: Patient tolerated the procedure well with no immediate complications.     Procedures  Critical Care performed:   ____________________________________________   INITIAL IMPRESSION / ASSESSMENT AND PLAN / ED COURSE  Pertinent labs & imaging results that were available during my care of the patient were reviewed by me and considered in my medical decision making (see chart for details).  Differential diagnosis includes, but is not limited to, intracranial hemorrhage, meningitis/encephalitis, previous head trauma, cavernous venous thrombosis, tension headache, temporal arteritis, migraine or migraine equivalent, idiopathic intracranial  hypertension, and non-specific headache. As part of my medical decision making, I reviewed the following data within the [facility] previous records on file for review  ----------------------------------------- 11:22 PM on 03/21/2018 -----------------------------------------  Patient at this time with persistent pain despite multiple migraine meds as well as Neurontin and morphine.  We will try a dose of Ativan for release of muscle tension.  Patient remains with intact sensorium.  Elevated white blood cell count but without fever chills or neck stiffness.  Possible peripheral cause because of pain with palpation.  Patient will be admitted for pain control and likely neurology consult.  Lumbar puncture deferred at this time.  Patient as well as family aware of diagnosis as well as treatment plan and willing to comply.  Signed out to Dr. Marcille Blanco. ____________________________________________   FINAL CLINICAL IMPRESSION(S) / ED DIAGNOSES  Headache.    NEW MEDICATIONS STARTED DURING THIS VISIT:  New Prescriptions   No medications on file     Note:  This document was prepared using Dragon voice recognition software and may include unintentional dictation errors.  Catcher Dehoyos, Randall An, MD 03/21/18 2323  Do not suspect spread of infection from the mouth as there is no mouth swelling.  Appeared to be resolution of her issues with her root canal.  No trismus.   Orbie Pyo, MD 03/21/18 [id number]    Orbie Pyo, MD 03/21/18 2325    Orbie Pyo, MD 03/21/18 [phone]

## 2018-03-21 NOTE — ED Triage Notes (Signed)
Pt c/o right sided head pain that pt sts, "shoots up behind my ear and into my right temple area." Pt c/o double vision in right eye. Pt had root canal x3 weeks ago on the right upper back. Pt denies numbness/weakness.

## 2018-03-21 NOTE — ED Notes (Signed)
This RN and Lanette Hampshire attempted to place IV in pt

## 2018-03-22 ENCOUNTER — Encounter: Payer: Self-pay | Admitting: Internal Medicine

## 2018-03-22 ENCOUNTER — Other Ambulatory Visit: Payer: Self-pay

## 2018-03-22 DIAGNOSIS — R51 Headache: Secondary | ICD-10-CM

## 2018-03-22 DIAGNOSIS — R52 Pain, unspecified: Secondary | ICD-10-CM | POA: Diagnosis present

## 2018-03-22 DIAGNOSIS — R519 Headache, unspecified: Secondary | ICD-10-CM | POA: Diagnosis present

## 2018-03-22 DIAGNOSIS — I10 Essential (primary) hypertension: Secondary | ICD-10-CM | POA: Diagnosis present

## 2018-03-22 MED ORDER — ENOXAPARIN SODIUM 40 MG/0.4ML ~~LOC~~ SOLN: 40.0000 mg | SUBCUTANEOUS | Status: DC

## 2018-03-22 MED ORDER — ONDANSETRON HCL 4 MG PO TABS: 4.0000 mg | ORAL_TABLET | Freq: Four times a day (QID) | ORAL | Status: DC | PRN

## 2018-03-22 MED ORDER — KETOROLAC TROMETHAMINE 15 MG/ML IJ SOLN
15.0000 mg | Freq: Four times a day (QID) | INTRAMUSCULAR | Status: DC | PRN
  Administered 2018-03-22: 15 mg via INTRAVENOUS

## 2018-03-22 MED ORDER — HYDROMORPHONE HCL 1 MG/ML IJ SOLN
INTRAMUSCULAR | Status: AC
  Administered 2018-03-22: 1 mg via INTRAVENOUS
  Filled 2018-03-22: qty 1

## 2018-03-22 MED ORDER — HYDROMORPHONE HCL 1 MG/ML IJ SOLN
1.0000 mg | Freq: Once | INTRAMUSCULAR | Status: AC
  Administered 2018-03-22: 1 mg via INTRAVENOUS

## 2018-03-22 MED ORDER — POTASSIUM CHLORIDE CRYS ER 20 MEQ PO TBCR: 40.0000 meq | EXTENDED_RELEASE_TABLET | Freq: Once | ORAL | Status: DC

## 2018-03-22 MED ORDER — ONDANSETRON HCL 4 MG/2ML IJ SOLN
4.0000 mg | Freq: Four times a day (QID) | INTRAMUSCULAR | Status: DC | PRN
  Administered 2018-03-22: 4 mg via INTRAVENOUS
  Filled 2018-03-22: qty 2

## 2018-03-22 MED ORDER — TRAMADOL HCL 50 MG PO TABS
50.0000 mg | ORAL_TABLET | Freq: Four times a day (QID) | ORAL | Status: DC | PRN
  Administered 2018-03-22: 50 mg via ORAL
  Filled 2018-03-22: qty 1

## 2018-03-22 MED ORDER — ACETAMINOPHEN 325 MG PO TABS: 650.0000 mg | ORAL_TABLET | Freq: Four times a day (QID) | ORAL | Status: DC | PRN

## 2018-03-22 MED ORDER — ACETAMINOPHEN 650 MG RE SUPP: 650.0000 mg | Freq: Four times a day (QID) | RECTAL | Status: DC | PRN

## 2018-03-22 MED ORDER — KETOROLAC TROMETHAMINE 15 MG/ML IJ SOLN
INTRAMUSCULAR | Status: AC
  Filled 2018-03-22: qty 1

## 2018-03-22 MED ORDER — SUMATRIPTAN SUCCINATE 6 MG/0.5ML ~~LOC~~ SOLN
6.0000 mg | Freq: Once | SUBCUTANEOUS | Status: AC
  Administered 2018-03-22: 6 mg via SUBCUTANEOUS
  Filled 2018-03-22: qty 0.5

## 2018-03-22 MED ORDER — PROCHLORPERAZINE EDISYLATE 10 MG/2ML IJ SOLN
10.0000 mg | Freq: Once | INTRAMUSCULAR | Status: DC
  Filled 2018-03-22: qty 2

## 2018-03-22 NOTE — H&P (Signed)
[facility] [place] NAME: Hannah Ballard    MR#:  [id number]  DATE OF BIRTH:  [birth year]  DATE OF ADMISSION:  03/21/2018  PRIMARY CARE PHYSICIAN: Patient, No Pcp Per   REQUESTING/REFERRING PHYSICIAN: Clearnce Hasten, MD  CHIEF COMPLAINT:   Chief Complaint  Patient presents with  . Headache    HISTORY OF PRESENT ILLNESS:  Hannah Ballard  is a 37 y.o. female who presents with headache.  Patient states that she developed this headache tonight, it woke her up from her sleep.  She states she is never had a headache like this before.  She does state that she has some cervical spinal pathology and has had plates but there.  In the past when she has had irritation of her nerves around her C-spine she has had some radiating pain from that, but she states she is never had a headache like when she has tonight.  She is received several doses of treatment in the ED including for migraines, though she denies any history of migraines, and all of his treatment has not improved her headache.  She had CT imaging including CT head which was all within normal limits.  Hospitalist were called for admission  PAST MEDICAL HISTORY:   Past Medical History:  Diagnosis Date  . Cancer ([place])   . Hypertension      PAST SURGICAL HISTORY:   Past Surgical History:  Procedure Laterality Date  . NO PAST SURGERIES       SOCIAL HISTORY:   Social History   Tobacco Use  . Smoking status: Current Every Day Smoker  . Smokeless tobacco: Never Used  Substance Use Topics  . Alcohol use: Not on file     FAMILY HISTORY:  Family history reviewed and is non-contributory   DRUG ALLERGIES:   Allergies  Allergen Reactions  . Aspirin Swelling    Tongue swelling    MEDICATIONS AT HOME:   Prior to Admission medications   Not on File    REVIEW OF SYSTEMS:  Review of Systems  Constitutional: Negative for chills, fever, malaise/fatigue and weight loss.  HENT:  Negative for ear pain, hearing loss and tinnitus.   Eyes: Negative for blurred vision, double vision, pain and redness.  Respiratory: Negative for cough, hemoptysis and shortness of breath.   Cardiovascular: Negative for chest pain, palpitations, orthopnea and leg swelling.  Gastrointestinal: Negative for abdominal pain, constipation, diarrhea, nausea and vomiting.  Genitourinary: Negative for dysuria, frequency and hematuria.  Musculoskeletal: Negative for back pain, joint pain and neck pain.  Skin:       No acne, rash, or lesions  Neurological: Positive for headaches. Negative for dizziness, tremors, focal weakness and weakness.  Endo/Heme/Allergies: Negative for polydipsia. Does not bruise/bleed easily.  Psychiatric/Behavioral: Negative for depression. The patient is not nervous/anxious and does not have insomnia.      VITAL SIGNS:   Vitals:   03/21/18 1918 03/21/18 1919 03/22/18 0200 03/22/18 0230  BP: (!) 157/94  (!) 148/75 140/79  Pulse: (!) 105  93 91  Resp: 18  (!) 21 19  Temp: 98.2 F (36.8 C)     TempSrc: Oral     SpO2: 99%  99% 96%  Weight:  99.8 kg (220 lb)    Height:  5\' 9"  (1.753 m)     Wt Readings from Last 3 Encounters:  03/21/18 99.8 kg (220 lb)    PHYSICAL EXAMINATION:  Physical Exam  Vitals reviewed. Constitutional: She is  oriented to person, place, and time. She appears well-developed and well-nourished. She appears distressed (mild).  HENT:  Head: Normocephalic and atraumatic.  Mouth/Throat: Oropharynx is clear and moist.  Eyes: Pupils are equal, round, and reactive to light. Conjunctivae and EOM are normal. No scleral icterus.  Neck: Normal range of motion. Neck supple. No JVD present. No thyromegaly present.  Cardiovascular: Regular rhythm and intact distal pulses. Exam reveals no gallop and no friction rub.  No murmur heard. Tachycardic  Respiratory: Effort normal and breath sounds normal. No respiratory distress. She has no wheezes. She has no  rales.  GI: Soft. Bowel sounds are normal. She exhibits no distension. There is no tenderness.  Musculoskeletal: Normal range of motion. She exhibits no edema.  No arthritis, no gout  Lymphadenopathy:    She has no cervical adenopathy.  Neurological: She is alert and oriented to person, place, and time. No cranial nerve deficit.  No dysarthria, no aphasia  Skin: Skin is warm and dry. No rash noted. No erythema.  Psychiatric: She has a normal mood and affect. Her behavior is normal. Judgment and thought content normal.    LABORATORY PANEL:   CBC Recent Labs  Lab 03/21/18 2146  WBC 16.4*  HGB 13.9  HCT 41.2  PLT 294   ------------------------------------------------------------------------------------------------------------------  Chemistries  Recent Labs  Lab 03/21/18 2146  NA 139  K 3.3*  CL 106  CO2 24  GLUCOSE 94  BUN 12  CREATININE 0.73  CALCIUM 7.8*   ------------------------------------------------------------------------------------------------------------------  Cardiac Enzymes No results for input(s): TROPONINI in the last 168 hours. ------------------------------------------------------------------------------------------------------------------  RADIOLOGY:  Ct Angio Head W Or Wo Contrast  Result Date: 03/21/2018 CLINICAL DATA:  37 y/o F; right-sided head pain behind the ear. Double vision in the right eye pain EXAM: CT ANGIOGRAPHY HEAD TECHNIQUE: Multidetector CT imaging of the head was performed using the standard protocol during bolus administration of intravenous contrast. Multiplanar CT image reconstructions and MIPs were obtained to evaluate the vascular anatomy. CONTRAST:  75mL OMNIPAQUE IOHEXOL 350 MG/ML SOLN COMPARISON:  03/21/2018 CT head FINDINGS: CTA HEAD Anterior circulation: No significant stenosis, proximal occlusion, aneurysm, or vascular malformation. Posterior circulation: No significant stenosis, proximal occlusion, aneurysm, or vascular  malformation. Venous sinuses: As permitted by contrast timing, patent. Anatomic variants: Complete circle-of-Riyan Gavina. Delayed phase: No abnormal intracranial enhancement. IMPRESSION: Negative CTA of the head. Electronically Signed   By: Kristine Garbe M.D.   On: 03/21/2018 23:08   Ct Head Wo Contrast  Result Date: 03/21/2018 CLINICAL DATA:  Pt c/o right sided head pain that pt sts, "shoots up behind my ear and into my right temple area." Pt c/o double vision in right eye. Pt had root canal x3 weeks ago on the right upper back. Pt denies numbness/weakness. EXAM: CT HEAD WITHOUT CONTRAST TECHNIQUE: Contiguous axial images were obtained from the base of the skull through the vertex without intravenous contrast. COMPARISON:  None. FINDINGS: Brain: No evidence of acute infarction, hemorrhage, hydrocephalus, extra-axial collection or mass lesion/mass effect. Vascular: No hyperdense vessel or unexpected calcification. Skull: Normal. Negative for fracture or focal lesion. Sinuses/Orbits: No acute finding. Other: None. IMPRESSION: Normal exam. Electronically Signed   By: Nolon Nations M.D.   On: 03/21/2018 20:11    EKG:  No orders found for this or any previous visit.  IMPRESSION AND PLAN:  Principal Problem:   Headache -unclear etiology at this time, patient initially was willing to go home, but then stated that her headache was worse again.  Patient admits  to recent use of marijuana, unclear if this is potentially related to her headache.   Active Problems:   Intractable pain -due to her headache as above.  She is received significant doses of medications to treat pain, with no reported improvement.  Patient's behavior is not consistent with constant pain that she reports it.  We will use PRN analgesia, try to avoid narcotics.   HTN (hypertension) -no home meds listed for this, patient's blood pressure is elevated, but within a controlled level for hospital parameters.  Monitor and treat  PRN  Chart review performed and case discussed with ED provider. Labs, imaging and/or ECG reviewed by provider and discussed with patient/family. Management plans discussed with the patient and/or family.  DVT PROPHYLAXIS: SubQ lovenox  GI PROPHYLAXIS: None  ADMISSION STATUS: Observation  CODE STATUS: Full  TOTAL TIME TAKING CARE OF THIS PATIENT: 40 minutes.   Traylen Eckels Adeline 03/22/2018, 3:22 AM  [facility]  Office  [phone]  CC: Primary care physician; Patient, No Pcp Per  Note:  This document was prepared using Dragon voice recognition software and may include unintentional dictation errors.

## 2018-03-22 NOTE — ED Notes (Signed)
Per family member, pt woke up in pain at this time.  Informed that this RN would notify MD.

## 2018-03-22 NOTE — ED Notes (Signed)
Pt reported instant pain relief as dilaudid being pushed into IV at this time.

## 2018-03-22 NOTE — Progress Notes (Signed)
Nurse received report that while nurse was receiving report in another room that patient had removed her IV,spoke with charge nurse , showed area that it was removed. Patient was upset that she was still having headache after pain medication had been administered. Nurse was informed that patient left floor.

## 2018-04-04 NOTE — Discharge Summary (Signed)
   [place] [facility] [place] NAME: Linzie Criss    MR#:  [id number]  DATE OF BIRTH:  06/20/1981  DATE OF ADMISSION:  03/21/2018   ADMITTING PHYSICIAN: Lance Coon, MD  DATE OF DISCHARGE: 03/22/2018  8:47 AM  PRIMARY CARE PHYSICIAN: Patient, No Pcp Per   ADMISSION DIAGNOSIS:  Intractable headache, unspecified chronicity pattern, unspecified headache type [R51] DISCHARGE DIAGNOSIS:  Principal Problem:   Headache Active Problems:   Intractable pain   HTN (hypertension)  SECONDARY DIAGNOSIS:   Past Medical History:  Diagnosis Date  . Cancer ([place])   . Hypertension    HOSPITAL COURSE:  Sofya Moustafa  is a 37 y.o. female who presents with headache.  Patient states that she developed this headache tonight, it woke her up from her sleep.  The patient was admitted for headache with unclear etiology. She left AMA more day early in the morning. DISCHARGE CONDITIONS:  The patient left AMA.  Demetrios Loll M.D on 04/04/2018 at 6:35 PM  Between 7am to 6pm - Pager - [phone]  After 6pm go to [email or web address] - Proofreader  [facility]  Office  [phone]  CC: Primary care physician; Patient, No Pcp Per   Note: This dictation was prepared with Dragon dictation along with smaller phrase technology. Any transcriptional errors that result from this process are unintentional.

## 2018-04-10 ENCOUNTER — Encounter: Payer: Self-pay | Admitting: Emergency Medicine

## 2018-04-10 ENCOUNTER — Ambulatory Visit
Admission: EM | Admit: 2018-04-10 | Discharge: 2018-04-10 | Disposition: A | Discharge status: Home / Self Care | Payer: BLUE CROSS/BLUE SHIELD | Attending: Family Medicine | Admitting: Family Medicine

## 2018-04-10 ENCOUNTER — Other Ambulatory Visit: Payer: Self-pay

## 2018-04-10 DIAGNOSIS — G43009 Migraine without aura, not intractable, without status migrainosus: Secondary | ICD-10-CM | POA: Diagnosis not present

## 2018-04-10 DIAGNOSIS — R110 Nausea: Secondary | ICD-10-CM | POA: Diagnosis not present

## 2018-04-10 HISTORY — DX: Migraine, unspecified, not intractable, without status migrainosus: G43.909

## 2018-04-10 MED ORDER — KETOROLAC TROMETHAMINE 60 MG/2ML IM SOLN
60.0000 mg | Freq: Once | INTRAMUSCULAR | Status: AC
  Administered 2018-04-10: 60 mg via INTRAMUSCULAR

## 2018-04-10 MED ORDER — BUTALBITAL-APAP-CAFFEINE 50-325-40 MG PO TABS: 1.0000 | ORAL_TABLET | Freq: Three times a day (TID) | ORAL | 0 refills | Status: AC | PRN

## 2018-04-10 MED ORDER — ONDANSETRON 8 MG PO TBDP
8.0000 mg | ORAL_TABLET | Freq: Once | ORAL | Status: AC
  Administered 2018-04-10: 8 mg via ORAL

## 2018-04-10 MED ORDER — TOPIRAMATE 50 MG PO TABS: 50.0000 mg | ORAL_TABLET | Freq: Two times a day (BID) | ORAL | 0 refills | Status: DC

## 2018-04-10 NOTE — ED Provider Notes (Signed)
[facility]    CSN: [id number] Arrival date & time: 04/10/18  1234     History   Chief Complaint Chief Complaint  Patient presents with  . Migraine    HPI Hannah Ballard is a 37 y.o. female.   37 yo female with h/o migraines presents with a c/o migraine headache since last night similar to prior migraines and associated with nausea and photophobia. States she moved here recently and will be setting up care with new PCP next month, but has run out of her prior medications (topamax and fioricet). Denies any fevers, chills, numbness/tingling.   The history is provided by the patient.  Migraine     Past Medical History:  Diagnosis Date  . Cancer ([place])   . Hypertension   . Migraines     Patient Active Problem List   Diagnosis Date Noted  . Headache 03/22/2018  . Intractable pain 03/22/2018  . HTN (hypertension) 03/22/2018    Past Surgical History:  Procedure Laterality Date  . ABDOMINAL HYSTERECTOMY    . FRACTURE SURGERY    . NO PAST SURGERIES    . SPINE SURGERY      OB History   None      Home Medications    Prior to Admission medications   Medication Sig Start Date End Date Taking? Authorizing Provider  butalbital-acetaminophen-caffeine (FIORICET, ESGIC) 50-325-40 MG tablet Take 1-2 tablets by mouth every 8 (eight) hours as needed for headache. 04/10/18 04/10/19  Norval Gable, MD  topiramate (TOPAMAX) 50 MG tablet Take 1 tablet (50 mg total) by mouth 2 (two) times daily. 04/10/18   Norval Gable, MD    Family History Family History  Problem Relation Age of Onset  . Healthy Mother   . Healthy Father     Social History Social History   Tobacco Use  . Smoking status: Current Every Day Smoker  . Smokeless tobacco: Never Used  Substance Use Topics  . Alcohol use: Never    Frequency: Never  . Drug use: Never     Allergies   Aspirin   Review of Systems Review of Systems   Physical Exam Triage Vital Signs ED Triage Vitals  Enc  Vitals Group     BP 04/10/18 1304 (!) 162/112     Pulse Rate 04/10/18 1304 (!) 113     Resp 04/10/18 1304 20     Temp 04/10/18 1304 98.8 F (37.1 C)     Temp Source 04/10/18 1304 Oral     SpO2 04/10/18 1304 97 %     Weight 04/10/18 1300 220 lb (99.8 kg)     Height 04/10/18 1300 5\' 9"  (1.753 m)     Head Circumference --      Peak Flow --      Pain Score 04/10/18 1259 8     Pain Loc --      Pain Edu? --      Excl. in [place]? --    No data found.  Updated Vital Signs BP (!) 162/112 (BP Location: Left Arm)   Pulse (!) 113   Temp 98.8 F (37.1 C) (Oral)   Resp 20   Ht 5\' 9"  (1.753 m)   Wt 220 lb (99.8 kg)   SpO2 97%   BMI 32.49 kg/m   Visual Acuity Right Eye Distance:   Left Eye Distance:   Bilateral Distance:    Right Eye Near:   Left Eye Near:    Bilateral Near:     Physical  Exam  Constitutional: She is oriented to person, place, and time. She appears well-developed and well-nourished. No distress.  HENT:  Head: Normocephalic and atraumatic.  Mouth/Throat: No oropharyngeal exudate.  Eyes: Pupils are equal, round, and reactive to light. Conjunctivae and EOM are normal. Right eye exhibits no discharge. Left eye exhibits no discharge.  Neck: Normal range of motion. Neck supple.  Neurological: She is alert and oriented to person, place, and time. She displays normal reflexes. No cranial nerve deficit or sensory deficit. She exhibits normal muscle tone. Coordination normal.  Skin: She is not diaphoretic.  Nursing note and vitals reviewed.    UC Treatments / Results  Labs (all labs ordered are listed, but only abnormal results are displayed) Labs Reviewed - No data to display  EKG None  Radiology No results found.  Procedures Procedures (including critical care time)  Medications Ordered in UC Medications  ketorolac (TORADOL) injection 60 mg (60 mg Intramuscular Given 04/10/18 1339)  ondansetron (ZOFRAN-ODT) disintegrating tablet 8 mg (8 mg Oral Given 04/10/18  1339)    Initial Impression / Assessment and Plan / UC Course  I have reviewed the triage vital signs and the nursing notes.  Pertinent labs & imaging results that were available during my care of the patient were reviewed by me and considered in my medical decision making (see chart for details).      Final Clinical Impressions(s) / UC Diagnoses   Final diagnoses:  Migraine without aura and without status migrainosus, not intractable   Discharge Instructions   None    ED Prescriptions    Medication Sig Dispense Auth. Provider   topiramate (TOPAMAX) 50 MG tablet Take 1 tablet (50 mg total) by mouth 2 (two) times daily. 21 tablet Heavin Sebree, Linward Foster, MD   butalbital-acetaminophen-caffeine (FIORICET, ESGIC) 50-325-40 MG tablet Take 1-2 tablets by mouth every 8 (eight) hours as needed for headache. 8 tablet Norval Gable, MD      1. diagnosis reviewed with patient 2. Patient given toradol and zofran as above with improvement of symptoms 3. rx as per orders above; reviewed possible side effects, interactions, risks and benefits  4. Follow-up prn if symptoms worsen or don't improve Controlled Substance Prescriptions [place] Controlled Substance Registry consulted? Not Applicable   Norval Gable, MD 04/10/18 2013

## 2018-04-10 NOTE — ED Triage Notes (Signed)
Pt here c/o migraine. Her pain is located at the top of her head and to the left. Nausea and vomiting, blurred vision. She has had migraines in the past. She is suppose to be taking Topamax and Fioricet but she just moved here and she has not had her rx in about 2 months.

## 2018-09-02 DIAGNOSIS — H9193 Unspecified hearing loss, bilateral: Secondary | ICD-10-CM | POA: Insufficient documentation

## 2020-08-13 ENCOUNTER — Other Ambulatory Visit: Payer: Self-pay

## 2020-08-13 ENCOUNTER — Emergency Department: Payer: BC Managed Care – PPO

## 2020-08-13 ENCOUNTER — Encounter: Payer: Self-pay | Admitting: Emergency Medicine

## 2020-08-13 ENCOUNTER — Emergency Department
Admission: EM | Admit: 2020-08-13 | Discharge: 2020-08-13 | Disposition: A | Discharge status: Home / Self Care | Payer: BC Managed Care – PPO | Attending: Emergency Medicine | Admitting: Emergency Medicine

## 2020-08-13 DIAGNOSIS — M79602 Pain in left arm: Secondary | ICD-10-CM | POA: Diagnosis not present

## 2020-08-13 DIAGNOSIS — R112 Nausea with vomiting, unspecified: Secondary | ICD-10-CM | POA: Diagnosis not present

## 2020-08-13 DIAGNOSIS — H5712 Ocular pain, left eye: Secondary | ICD-10-CM | POA: Diagnosis not present

## 2020-08-13 DIAGNOSIS — R509 Fever, unspecified: Secondary | ICD-10-CM | POA: Diagnosis not present

## 2020-08-13 DIAGNOSIS — M542 Cervicalgia: Secondary | ICD-10-CM | POA: Diagnosis not present

## 2020-08-13 DIAGNOSIS — G4486 Cervicogenic headache: Secondary | ICD-10-CM | POA: Diagnosis not present

## 2020-08-13 DIAGNOSIS — I10 Essential (primary) hypertension: Secondary | ICD-10-CM | POA: Insufficient documentation

## 2020-08-13 DIAGNOSIS — F17200 Nicotine dependence, unspecified, uncomplicated: Secondary | ICD-10-CM | POA: Insufficient documentation

## 2020-08-13 DIAGNOSIS — H9202 Otalgia, left ear: Secondary | ICD-10-CM | POA: Diagnosis not present

## 2020-08-13 DIAGNOSIS — Z79899 Other long term (current) drug therapy: Secondary | ICD-10-CM | POA: Diagnosis not present

## 2020-08-13 DIAGNOSIS — M62838 Other muscle spasm: Secondary | ICD-10-CM

## 2020-08-13 LAB — CBC WITH DIFFERENTIAL/PLATELET
Abs Immature Granulocytes: 0.03 10*3/uL (ref 0.00–0.07)
Basophils Absolute: 0 10*3/uL (ref 0.0–0.1)
Basophils Relative: 0 %
Eosinophils Absolute: 0.2 10*3/uL (ref 0.0–0.5)
Eosinophils Relative: 2 %
HCT: 37.4 % (ref 36.0–46.0)
Hemoglobin: 12.7 g/dL (ref 12.0–15.0)
Immature Granulocytes: 0 %
Lymphocytes Relative: 38 %
Lymphs Abs: 4.4 10*3/uL — ABNORMAL HIGH (ref 0.7–4.0)
MCH: 30.5 pg (ref 26.0–34.0)
MCHC: 34 g/dL (ref 30.0–36.0)
MCV: 89.7 fL (ref 80.0–100.0)
Monocytes Absolute: 0.8 10*3/uL (ref 0.1–1.0)
Monocytes Relative: 7 %
Neutro Abs: 6.1 10*3/uL (ref 1.7–7.7)
Neutrophils Relative %: 53 %
Platelets: 243 10*3/uL (ref 150–400)
RBC: 4.17 MIL/uL (ref 3.87–5.11)
RDW: 11.9 % (ref 11.5–15.5)
WBC: 11.5 10*3/uL — ABNORMAL HIGH (ref 4.0–10.5)
nRBC: 0 % (ref 0.0–0.2)

## 2020-08-13 LAB — COMPREHENSIVE METABOLIC PANEL
ALT: 36 U/L (ref 0–44)
AST: 23 U/L (ref 15–41)
Albumin: 3.4 g/dL — ABNORMAL LOW (ref 3.5–5.0)
Alkaline Phosphatase: 69 U/L (ref 38–126)
Anion gap: 11 (ref 5–15)
BUN: 17 mg/dL (ref 6–20)
CO2: 22 mmol/L (ref 22–32)
Calcium: 8.8 mg/dL — ABNORMAL LOW (ref 8.9–10.3)
Chloride: 107 mmol/L (ref 98–111)
Creatinine, Ser: 0.51 mg/dL (ref 0.44–1.00)
GFR, Estimated: >60 mL/min (ref >60)
Glucose, Bld: 101 mg/dL — ABNORMAL HIGH (ref 70–99)
Potassium: 3.6 mmol/L (ref 3.5–5.1)
Sodium: 140 mmol/L (ref 135–145)
Total Bilirubin: 0.5 mg/dL (ref 0.3–1.2)
Total Protein: 6.5 g/dL (ref 6.5–8.1)

## 2020-08-13 MED ORDER — SODIUM CHLORIDE 0.9 % IV BOLUS
1000.0000 mL | Freq: Once | INTRAVENOUS | Status: AC
  Administered 2020-08-13: 1000 mL via INTRAVENOUS

## 2020-08-13 MED ORDER — ONDANSETRON HCL 4 MG/2ML IJ SOLN
4.0000 mg | Freq: Once | INTRAMUSCULAR | Status: AC
  Administered 2020-08-13: 4 mg via INTRAVENOUS
  Filled 2020-08-13: qty 2

## 2020-08-13 MED ORDER — ORPHENADRINE CITRATE 30 MG/ML IJ SOLN
60.0000 mg | Freq: Two times a day (BID) | INTRAMUSCULAR | Status: DC
  Administered 2020-08-13: 60 mg via INTRAVENOUS
  Filled 2020-08-13: qty 2

## 2020-08-13 MED ORDER — KETOROLAC TROMETHAMINE 10 MG PO TABS
10.0000 mg | ORAL_TABLET | Freq: Four times a day (QID) | ORAL | 0 refills | Status: AC | PRN
Start: 2020-08-13 — End: 2020-08-18

## 2020-08-13 MED ORDER — METHOCARBAMOL 750 MG PO TABS: 750.0000 mg | ORAL_TABLET | Freq: Four times a day (QID) | ORAL | 0 refills | Status: AC | PRN

## 2020-08-13 MED ORDER — LIDOCAINE HCL (PF) 1 % IJ SOLN
2.0000 mL | Freq: Once | INTRAMUSCULAR | Status: AC
  Administered 2020-08-13: 2 mL
  Filled 2020-08-13: qty 5

## 2020-08-13 MED ORDER — FENTANYL CITRATE (PF) 100 MCG/2ML IJ SOLN
50.0000 ug | Freq: Once | INTRAMUSCULAR | Status: AC
  Administered 2020-08-13: 50 ug via INTRAVENOUS
  Filled 2020-08-13: qty 2

## 2020-08-13 MED ORDER — KETOROLAC TROMETHAMINE 30 MG/ML IJ SOLN
30.0000 mg | Freq: Once | INTRAMUSCULAR | Status: AC
  Administered 2020-08-13: 30 mg via INTRAVENOUS
  Filled 2020-08-13: qty 1

## 2020-08-13 NOTE — ED Notes (Signed)
Pt reports yesterday she began to have a mild headache, unsual for her. She took ibuprofen and went about her day. Progressed to migraine, shooting pains in neck by bedtime. Pt reports this morning she turned her head to the right and has had greatly increased pain, limited mobility, and an intense pressure headache. Pt reports photosensitivity.   Pt denies loss of visual fields, blurry or double vision. Reports sometimes seeing spots.   Hx of cervical spine hardware. Hardware place December 2008

## 2020-08-13 NOTE — Discharge Instructions (Addendum)
Please take the Toradol and Robaxin as prescribed, you can take these in addition to Tylenol.  If your symptoms change or worsen, please return to the emergency department.  Otherwise, please follow-up with your primary care or neurosurgeon in [place].

## 2020-08-13 NOTE — ED Triage Notes (Signed)
Pt reports she has plates at level of C2 and C3.  States starting yesterday she has spontaneous pain in neck that radiates into arms.  Pt in obvious pain in triage.

## 2020-08-13 NOTE — ED Notes (Signed)
Patient transported to CT 
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
## 2020-08-15 ENCOUNTER — Other Ambulatory Visit: Payer: Self-pay

## 2020-08-15 ENCOUNTER — Emergency Department
Admission: EM | Admit: 2020-08-15 | Discharge: 2020-08-15 | Disposition: A | Discharge status: Home / Self Care | Payer: BC Managed Care – PPO | Attending: Emergency Medicine | Admitting: Emergency Medicine

## 2020-08-15 ENCOUNTER — Emergency Department: Payer: BC Managed Care – PPO

## 2020-08-15 ENCOUNTER — Encounter: Payer: Self-pay | Admitting: Emergency Medicine

## 2020-08-15 DIAGNOSIS — G4486 Cervicogenic headache: Secondary | ICD-10-CM | POA: Diagnosis not present

## 2020-08-15 DIAGNOSIS — Z981 Arthrodesis status: Secondary | ICD-10-CM

## 2020-08-15 DIAGNOSIS — F17200 Nicotine dependence, unspecified, uncomplicated: Secondary | ICD-10-CM | POA: Insufficient documentation

## 2020-08-15 DIAGNOSIS — M4322 Fusion of spine, cervical region: Secondary | ICD-10-CM | POA: Insufficient documentation

## 2020-08-15 DIAGNOSIS — G8929 Other chronic pain: Secondary | ICD-10-CM | POA: Insufficient documentation

## 2020-08-15 DIAGNOSIS — I10 Essential (primary) hypertension: Secondary | ICD-10-CM | POA: Insufficient documentation

## 2020-08-15 DIAGNOSIS — Z79899 Other long term (current) drug therapy: Secondary | ICD-10-CM | POA: Insufficient documentation

## 2020-08-15 DIAGNOSIS — Z859 Personal history of malignant neoplasm, unspecified: Secondary | ICD-10-CM | POA: Diagnosis not present

## 2020-08-15 DIAGNOSIS — M542 Cervicalgia: Secondary | ICD-10-CM | POA: Diagnosis present

## 2020-08-15 LAB — BASIC METABOLIC PANEL
Anion gap: 12 (ref 5–15)
BUN: 10 mg/dL (ref 6–20)
CO2: 21 mmol/L — ABNORMAL LOW (ref 22–32)
Calcium: 9.3 mg/dL (ref 8.9–10.3)
Chloride: 107 mmol/L (ref 98–111)
Creatinine, Ser: 0.49 mg/dL (ref 0.44–1.00)
GFR, Estimated: >60 mL/min (ref >60)
Glucose, Bld: 175 mg/dL — ABNORMAL HIGH (ref 70–99)
Potassium: 3.8 mmol/L (ref 3.5–5.1)
Sodium: 140 mmol/L (ref 135–145)

## 2020-08-15 LAB — CBC WITH DIFFERENTIAL/PLATELET
Abs Immature Granulocytes: 0.03 10*3/uL (ref 0.00–0.07)
Basophils Absolute: 0 10*3/uL (ref 0.0–0.1)
Basophils Relative: 0 %
Eosinophils Absolute: 0.1 10*3/uL (ref 0.0–0.5)
Eosinophils Relative: 1 %
HCT: 38.1 % (ref 36.0–46.0)
Hemoglobin: 12.5 g/dL (ref 12.0–15.0)
Immature Granulocytes: 0 %
Lymphocytes Relative: 24 %
Lymphs Abs: 2.3 10*3/uL (ref 0.7–4.0)
MCH: 29.7 pg (ref 26.0–34.0)
MCHC: 32.8 g/dL (ref 30.0–36.0)
MCV: 90.5 fL (ref 80.0–100.0)
Monocytes Absolute: 0.5 10*3/uL (ref 0.1–1.0)
Monocytes Relative: 5 %
Neutro Abs: 6.8 10*3/uL (ref 1.7–7.7)
Neutrophils Relative %: 70 %
Platelets: 239 10*3/uL (ref 150–400)
RBC: 4.21 MIL/uL (ref 3.87–5.11)
RDW: 11.7 % (ref 11.5–15.5)
WBC: 9.7 10*3/uL (ref 4.0–10.5)
nRBC: 0 % (ref 0.0–0.2)

## 2020-08-15 LAB — MAGNESIUM: Magnesium: 1.9 mg/dL (ref 1.7–2.4)

## 2020-08-15 LAB — LACTIC ACID, PLASMA: Lactic Acid, Venous: 1.5 mmol/L (ref 0.5–1.9)

## 2020-08-15 MED ORDER — LORAZEPAM 2 MG/ML IJ SOLN: 1.0000 mg | INTRAMUSCULAR | Status: DC | PRN

## 2020-08-15 MED ORDER — HYDROMORPHONE HCL 1 MG/ML IJ SOLN
INTRAMUSCULAR | Status: AC
  Administered 2020-08-15: 0.5 mg via INTRAVENOUS
  Filled 2020-08-15: qty 1

## 2020-08-15 MED ORDER — HYDROMORPHONE HCL 1 MG/ML IJ SOLN: 0.5000 mg | Freq: Once | INTRAMUSCULAR | Status: AC

## 2020-08-15 MED ORDER — PROCHLORPERAZINE EDISYLATE 10 MG/2ML IJ SOLN
10.0000 mg | Freq: Once | INTRAMUSCULAR | Status: AC
  Administered 2020-08-15: 10 mg via INTRAVENOUS
  Filled 2020-08-15: qty 2

## 2020-08-15 MED ORDER — LACTATED RINGERS IV BOLUS
1000.0000 mL | Freq: Once | INTRAVENOUS | Status: AC
  Administered 2020-08-15: 1000 mL via INTRAVENOUS

## 2020-08-15 MED ORDER — PROCHLORPERAZINE MALEATE 10 MG PO TABS: 10.0000 mg | ORAL_TABLET | Freq: Four times a day (QID) | ORAL | 0 refills | Status: DC | PRN

## 2020-08-15 MED ORDER — HYDROMORPHONE HCL 1 MG/ML IJ SOLN
0.5000 mg | Freq: Once | INTRAMUSCULAR | Status: AC
  Administered 2020-08-15: 0.5 mg via INTRAVENOUS
  Filled 2020-08-15: qty 1

## 2020-08-15 MED ORDER — KETOROLAC TROMETHAMINE 30 MG/ML IJ SOLN
15.0000 mg | Freq: Once | INTRAMUSCULAR | Status: AC
  Administered 2020-08-15: 15 mg via INTRAVENOUS
  Filled 2020-08-15: qty 1

## 2020-08-15 MED ORDER — DIPHENHYDRAMINE HCL 50 MG/ML IJ SOLN
25.0000 mg | Freq: Once | INTRAMUSCULAR | Status: AC
  Administered 2020-08-15: 25 mg via INTRAVENOUS
  Filled 2020-08-15: qty 1

## 2020-08-15 NOTE — ED Notes (Signed)
Provided DC instructions. Verbalized understanding.  

## 2020-08-15 NOTE — ED Notes (Signed)
Received call that patient was in MRI and in too much pain to continue procedure. Gave 0.5 Dilaudid per Dr. Charna Archer. Given to patient in MRI holding.

## 2020-08-15 NOTE — ED Notes (Signed)
Pt to ED stating has shooting, burning sharp pain behind L eye that goes down L shoulder, worse if lays flat or turns head to R. Pain started 3d ago. Neurosurgeon recommended that pt come to ED if pain did not resolve.. Pt has not slept for 3 nights. Pt had spinal fusion surgery in 2008 in C2, C3, C4. EDP at bedside.

## 2020-08-15 NOTE — ED Notes (Signed)
ED EKG performed per verbal order.

## 2020-08-15 NOTE — ED Notes (Signed)
Husband at bedside.  

## 2020-08-15 NOTE — ED Notes (Signed)
Pt husband came out asking for nurse to come see pt. Pt crying stating pain in neck is 9/10, burning and shooting. This nurse informed them that EDP will be notified to come reassess pt.

## 2020-08-15 NOTE — ED Triage Notes (Signed)
Pt arrived via POV with reports of continued neck pain, pt states muscle relaxants not helping, states the pain worsening, pt has neurosurgeon in [place] but is not returning to [place] until January.
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
## 2020-08-15 NOTE — ED Notes (Signed)
Pt to MRI

## 2020-08-15 NOTE — ED Notes (Signed)
EDP at bedside  

## 2020-08-20 LAB — CULTURE, BLOOD (ROUTINE X 2)
Culture: NO GROWTH
Culture: NO GROWTH

## 2021-03-17 ENCOUNTER — Ambulatory Visit: Payer: BC Managed Care – PPO | Admitting: Family Medicine

## 2021-03-17 ENCOUNTER — Other Ambulatory Visit: Payer: Self-pay

## 2021-04-19 DIAGNOSIS — Z8639 Personal history of other endocrine, nutritional and metabolic disease: Secondary | ICD-10-CM | POA: Insufficient documentation

## 2021-04-19 DIAGNOSIS — F419 Anxiety disorder, unspecified: Secondary | ICD-10-CM | POA: Insufficient documentation

## 2021-04-19 DIAGNOSIS — E890 Postprocedural hypothyroidism: Secondary | ICD-10-CM | POA: Insufficient documentation

## 2021-07-23 DIAGNOSIS — Z9884 Bariatric surgery status: Secondary | ICD-10-CM | POA: Insufficient documentation

## 2022-04-27 ENCOUNTER — Encounter: Payer: Self-pay | Admitting: Family

## 2022-04-27 NOTE — Progress Notes (Signed)
  This encounter was created in error - please disregard. No show 

## 2022-05-07 DIAGNOSIS — K219 Gastro-esophageal reflux disease without esophagitis: Secondary | ICD-10-CM | POA: Insufficient documentation

## 2022-06-12 ENCOUNTER — Encounter: Payer: Self-pay | Admitting: *Deleted

## 2022-06-12 ENCOUNTER — Emergency Department: Payer: BC Managed Care – PPO

## 2022-06-12 ENCOUNTER — Other Ambulatory Visit: Payer: Self-pay

## 2022-06-12 ENCOUNTER — Emergency Department
Admission: EM | Admit: 2022-06-12 | Discharge: 2022-06-13 | Disposition: A | Discharge status: Home / Self Care | Payer: BC Managed Care – PPO | Attending: Emergency Medicine | Admitting: Emergency Medicine

## 2022-06-12 DIAGNOSIS — I10 Essential (primary) hypertension: Secondary | ICD-10-CM | POA: Insufficient documentation

## 2022-06-12 DIAGNOSIS — Z20822 Contact with and (suspected) exposure to covid-19: Secondary | ICD-10-CM | POA: Diagnosis not present

## 2022-06-12 DIAGNOSIS — R112 Nausea with vomiting, unspecified: Secondary | ICD-10-CM | POA: Insufficient documentation

## 2022-06-12 DIAGNOSIS — R1084 Generalized abdominal pain: Secondary | ICD-10-CM

## 2022-06-12 LAB — COMPREHENSIVE METABOLIC PANEL
ALT: 25 U/L (ref 0–44)
AST: 23 U/L (ref 15–41)
Albumin: 4.6 g/dL (ref 3.5–5.0)
Alkaline Phosphatase: 101 U/L (ref 38–126)
Anion gap: 10 (ref 5–15)
BUN: 10 mg/dL (ref 6–20)
CO2: 25 mmol/L (ref 22–32)
Calcium: 8.8 mg/dL — ABNORMAL LOW (ref 8.9–10.3)
Chloride: 104 mmol/L (ref 98–111)
Creatinine, Ser: 0.84 mg/dL (ref 0.44–1.00)
GFR, Estimated: >60 mL/min (ref >60)
Glucose, Bld: 111 mg/dL — ABNORMAL HIGH (ref 70–99)
Potassium: 3.6 mmol/L (ref 3.5–5.1)
Sodium: 139 mmol/L (ref 135–145)
Total Bilirubin: 0.6 mg/dL (ref 0.3–1.2)
Total Protein: 8.4 g/dL — ABNORMAL HIGH (ref 6.5–8.1)

## 2022-06-12 LAB — CBC
HCT: 42.5 % (ref 36.0–46.0)
Hemoglobin: 13.8 g/dL (ref 12.0–15.0)
MCH: 30.5 pg (ref 26.0–34.0)
MCHC: 32.5 g/dL (ref 30.0–36.0)
MCV: 93.8 fL (ref 80.0–100.0)
Platelets: 361 10*3/uL (ref 150–400)
RBC: 4.53 MIL/uL (ref 3.87–5.11)
RDW: 12.7 % (ref 11.5–15.5)
WBC: 11.4 10*3/uL — ABNORMAL HIGH (ref 4.0–10.5)
nRBC: 0 % (ref 0.0–0.2)

## 2022-06-12 LAB — LIPASE, BLOOD: Lipase: 32 U/L (ref 11–51)

## 2022-06-12 LAB — SARS CORONAVIRUS 2 BY RT PCR: SARS Coronavirus 2 by RT PCR: NEGATIVE

## 2022-06-12 MED ORDER — HALOPERIDOL LACTATE 5 MG/ML IJ SOLN
2.5000 mg | Freq: Once | INTRAMUSCULAR | Status: AC
  Administered 2022-06-12: 2.5 mg via INTRAVENOUS
  Filled 2022-06-12: qty 1

## 2022-06-12 MED ORDER — IOHEXOL 300 MG/ML  SOLN
100.0000 mL | Freq: Once | INTRAMUSCULAR | Status: AC | PRN
  Administered 2022-06-12: 100 mL via INTRAVENOUS

## 2022-06-12 MED ORDER — SODIUM CHLORIDE 0.9 % IV BOLUS
1000.0000 mL | Freq: Once | INTRAVENOUS | Status: AC
  Administered 2022-06-12: 1000 mL via INTRAVENOUS

## 2022-06-12 MED ORDER — HYDROMORPHONE HCL 1 MG/ML IJ SOLN
1.0000 mg | Freq: Once | INTRAMUSCULAR | Status: AC
  Administered 2022-06-12: 1 mg via INTRAVENOUS
  Filled 2022-06-12: qty 1

## 2022-06-12 MED ORDER — ONDANSETRON HCL 4 MG/2ML IJ SOLN
4.0000 mg | Freq: Once | INTRAMUSCULAR | Status: AC
  Administered 2022-06-12: 4 mg via INTRAVENOUS
  Filled 2022-06-12: qty 2

## 2022-06-12 MED ORDER — PANTOPRAZOLE SODIUM 40 MG IV SOLR
40.0000 mg | Freq: Once | INTRAVENOUS | Status: AC
  Administered 2022-06-12: 40 mg via INTRAVENOUS
  Filled 2022-06-12: qty 10

## 2022-06-12 NOTE — ED Provider Notes (Incomplete)
-----------------------------------------   11:55 PM on 06/12/2022 -----------------------------------------   CT abdomen/pelvis interpreted per Dr. Genene Churn:  1. Status post cholecystectomy.  2. No CT evidence of acute abdominal/pelvic process.  3. Exam is limited due to patient motion.   Updated patient of CT imaging results.  Strict return precautions given.  Patient verbalizes understanding and agrees with plan of care.

## 2022-06-12 NOTE — ED Triage Notes (Addendum)
Pt had gallbladder removed approx 10 days at [place].  Pt fell 4 days later.  Pt reports increased pain and nausea.  Pt alert.  Speech clear.  Hx gastric bypass 2022

## 2022-06-12 NOTE — ED Provider Notes (Signed)
[facility] Provider Note    Event Date/Time   First MD Initiated Contact with Patient 06/12/22 1920     (approximate)   History   Chief Complaint: Abdominal Pain   HPI  Hannah Ballard is a 41 y.o. female with a past history of hypertension, migraines who comes ED complaining of abdominal pain nausea and vomiting for the past 4 days.  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  Then several weeks ago she started having recurrent severe upper abdominal pain that was worse after eating.  She had extensive work-up which was unremarkable, and eventually her surgeon pursued diagnostic laparoscopy and performed cholecystectomy.  I reviewed outside records including hospitalization summary.  From her surgeon at [facility] noting that she had no significant postoperative complications.  She underwent EGD, HIDA scan, MRCP, CT abdomen pelvis during her preoperative work-up which were all unremarkable.  6 days after surgery, patient reports that she slipped in the bathroom.  She did not fall and sustain any particular trauma but she did have to catch herself by grabbing onto the counter which put heavy strain on her abdomen and she felt a tearing pop sensation with immediate severe pain that has been persistent ever since.  She went to the emergency department at [facility] that day, where work-up was unrevealing.  Notably, CT scan was limited by motion artifact due to the patient's severe pain.  She was seen by the bariatric surgery team and cleared for discharge from the emergency department that day.     Physical Exam   Triage Vital Signs: ED Triage Vitals  Enc Vitals Group     BP 06/12/22 1803 (!) 149/106     Pulse Rate 06/12/22 1803 (!) 116     Resp 06/12/22 1803 20     Temp 06/12/22 1803 99.2 F (37.3 C)     Temp Source 06/12/22 1803 Oral     SpO2 06/12/22 1803 98 %     Weight 06/12/22 1801 200 lb (90.7 kg)     Height 06/12/22 1801 '5\' 9"'$  (1.753 m)     Head Circumference  --      Peak Flow --      Pain Score 06/12/22 1801 7     Pain Loc --      Pain Edu? --      Excl. in [place]? --     Most recent vital signs: Vitals:   06/12/22 2100 06/12/22 2200  BP: 105/71 112/68  Pulse: 98 (!) 108  Resp:    Temp:    SpO2: 95% 98%    General: Awake, no distress.  CV:  Good peripheral perfusion.  Tachycardia heart rate 110, normal distal pulses Resp:  Normal effort.  Clear to auscultation bilaterally Abd:  No distention.  Soft without peritoneal signs.  2 right-sided incisions have pronounced tenderness.  No palpable subcutaneous mass, no bleeding or discharge, no evidence of soft tissue infection.  Far right lateral and far left lateral abdominal incisions are intact and healing well and nontender.  She has some tenderness to deep palpation of the abdomen diffusely along the right side. Other:  No lower extremity edema or rash.   ED Results / Procedures / Treatments   Labs (all labs ordered are listed, but only abnormal results are displayed) Labs Reviewed  COMPREHENSIVE METABOLIC PANEL - Abnormal; Notable for the following components:      Result Value   Glucose, Bld 111 (*)    Calcium 8.8 (*)  Total Protein 8.4 (*)    All other components within normal limits  CBC - Abnormal; Notable for the following components:   WBC 11.4 (*)    All other components within normal limits  SARS CORONAVIRUS 2 BY RT PCR  LIPASE, BLOOD     EKG    RADIOLOGY CT abdomen pelvis pending   PROCEDURES:  Procedures   MEDICATIONS ORDERED IN ED: Medications  sodium chloride 0.9 % bolus 1,000 mL (0 mLs Intravenous Stopped 06/12/22 2238)  HYDROmorphone (DILAUDID) injection 1 mg (1 mg Intravenous Given 06/12/22 2042)  ondansetron (ZOFRAN) injection 4 mg (4 mg Intravenous Given 06/12/22 2041)  pantoprazole (PROTONIX) injection 40 mg (40 mg Intravenous Given 06/12/22 2044)  HYDROmorphone (DILAUDID) injection 1 mg (1 mg Intravenous Given 06/12/22 2244)  haloperidol lactate  (HALDOL) injection 2.5 mg (2.5 mg Intravenous Given 06/12/22 2242)     IMPRESSION / MDM / ASSESSMENT AND PLAN / ED COURSE  I reviewed the triage vital signs and the nursing notes.                              Differential diagnosis includes, but is not limited to, internal hernia, abdominal wall hernia, muscle strain, seroma, hematoma, intra-abdominal abscess  Patient's presentation is most consistent with acute presentation with potential threat to life or bodily function.  Patient presents with severe worsening abdominal pain with nausea and vomiting.  Possible postoperative complication from diagnostic laparoscopy and cholecystectomy.  She did have repair of internal hernia during the laparoscopic procedure as well, which may have been disrupted by her recent slip.  Unfortunately CT scan that was performed in [facility] ED immediately after her new injury was limited by pronounced motion during the exam.  We will plan to repeat the scan today to ensure there was no missed pathology due to patient/technical limitations.  We will give IV fluids for hydration and IV Dilaudid for pain control.      FINAL CLINICAL IMPRESSION(S) / ED DIAGNOSES   Final diagnoses:  Generalized abdominal pain     Rx / DC Orders   ED Discharge Orders     None        Note:  This document was prepared using Dragon voice recognition software and may include unintentional dictation errors.   Carrie Mew, MD 06/12/22 2303

## 2022-06-12 NOTE — ED Provider Notes (Signed)
-----------------------------------------   11:55 PM on 06/12/2022 -----------------------------------------   CT abdomen/pelvis interpreted per Dr. Genene Churn:  1. Status post cholecystectomy.  2. No CT evidence of acute abdominal/pelvic process.  3. Exam is limited due to patient motion.   Updated patient of CT imaging results.  Strict return precautions given.  Patient verbalizes understanding and agrees with plan of care.   Paulette Blanch, MD 06/13/22 [phone]

## 2022-06-13 MED ORDER — PROMETHAZINE HCL 25 MG RE SUPP: 25.0000 mg | Freq: Four times a day (QID) | RECTAL | 0 refills | Status: DC | PRN

## 2022-06-13 MED ORDER — HYDROMORPHONE HCL 2 MG PO TABS: 2.0000 mg | ORAL_TABLET | Freq: Four times a day (QID) | ORAL | 0 refills | Status: DC | PRN

## 2022-06-13 MED ORDER — DIAZEPAM 2 MG PO TABS: 2.0000 mg | ORAL_TABLET | Freq: Three times a day (TID) | ORAL | 0 refills | Status: DC | PRN

## 2022-06-13 NOTE — ED Notes (Addendum)
Pt reports she is feeling better and is no longer in any pain. Pt A&OX4 ambulatory at d/c with independent steady gait. Pt verbalized understanding of d/c instructions, prescriptions and follow up care. Pt reports her husband will be driving her home.

## 2022-06-13 NOTE — Discharge Instructions (Signed)
Stop taking your pain medicine and muscle relaxer. Instead you may take Dilaudid and Valium. Use Phenergan suppository as needed for nausea/vomiting. Return to the ER for worsening symptoms, persistent vomiting, difficulty breathing or other concerns.

## 2022-08-30 ENCOUNTER — Emergency Department
Admission: EM | Admit: 2022-08-30 | Discharge: 2022-08-30 | Disposition: A | Discharge status: Home / Self Care | Payer: Medicaid Other | Attending: Emergency Medicine | Admitting: Emergency Medicine

## 2022-08-30 ENCOUNTER — Emergency Department: Payer: Medicaid Other

## 2022-08-30 DIAGNOSIS — D72829 Elevated white blood cell count, unspecified: Secondary | ICD-10-CM | POA: Diagnosis not present

## 2022-08-30 DIAGNOSIS — L03115 Cellulitis of right lower limb: Secondary | ICD-10-CM | POA: Insufficient documentation

## 2022-08-30 DIAGNOSIS — M79604 Pain in right leg: Secondary | ICD-10-CM | POA: Diagnosis present

## 2022-08-30 LAB — CBC WITH DIFFERENTIAL/PLATELET
Abs Immature Granulocytes: 0.03 10*3/uL (ref 0.00–0.07)
Basophils Absolute: 0.1 10*3/uL (ref 0.0–0.1)
Basophils Relative: 0 %
Eosinophils Absolute: 0.3 10*3/uL (ref 0.0–0.5)
Eosinophils Relative: 2 %
HCT: 40.8 % (ref 36.0–46.0)
Hemoglobin: 13.2 g/dL (ref 12.0–15.0)
Immature Granulocytes: 0 %
Lymphocytes Relative: 24 %
Lymphs Abs: 2.8 10*3/uL (ref 0.7–4.0)
MCH: 31.7 pg (ref 26.0–34.0)
MCHC: 32.4 g/dL (ref 30.0–36.0)
MCV: 98.1 fL (ref 80.0–100.0)
Monocytes Absolute: 0.7 10*3/uL (ref 0.1–1.0)
Monocytes Relative: 6 %
Neutro Abs: 8.1 10*3/uL — ABNORMAL HIGH (ref 1.7–7.7)
Neutrophils Relative %: 68 %
Platelets: 324 10*3/uL (ref 150–400)
RBC: 4.16 MIL/uL (ref 3.87–5.11)
RDW: 12.5 % (ref 11.5–15.5)
WBC: 11.9 10*3/uL — ABNORMAL HIGH (ref 4.0–10.5)
nRBC: 0 % (ref 0.0–0.2)

## 2022-08-30 LAB — BASIC METABOLIC PANEL
Anion gap: 8 (ref 5–15)
BUN: 22 mg/dL — ABNORMAL HIGH (ref 6–20)
CO2: 26 mmol/L (ref 22–32)
Calcium: 8.9 mg/dL (ref 8.9–10.3)
Chloride: 108 mmol/L (ref 98–111)
Creatinine, Ser: 0.68 mg/dL (ref 0.44–1.00)
GFR, Estimated: >60 mL/min (ref >60)
Glucose, Bld: 110 mg/dL — ABNORMAL HIGH (ref 70–99)
Potassium: 4.2 mmol/L (ref 3.5–5.1)
Sodium: 142 mmol/L (ref 135–145)

## 2022-08-30 MED ORDER — CEPHALEXIN 500 MG PO CAPS: 500.0000 mg | ORAL_CAPSULE | Freq: Two times a day (BID) | ORAL | 0 refills | Status: AC

## 2022-08-30 MED ORDER — OXYCODONE HCL 5 MG PO TABS
5.0000 mg | ORAL_TABLET | Freq: Once | ORAL | Status: AC
  Administered 2022-08-30: 5 mg via ORAL
  Filled 2022-08-30: qty 1

## 2022-08-30 MED ORDER — CEPHALEXIN 500 MG PO CAPS
500.0000 mg | ORAL_CAPSULE | Freq: Once | ORAL | Status: AC
  Administered 2022-08-30: 500 mg via ORAL
  Filled 2022-08-30: qty 1

## 2022-08-30 NOTE — ED Triage Notes (Signed)
Ambulatory to triage with steady gait with c/o right leg pain. Pt wore compression stockings yesterday and after removing ha noticed swelling, redness, and warmth to back of calf. Denies any injury. Has been sitting at desk for apx 13 hrs for work today.

## 2022-08-30 NOTE — Discharge Instructions (Addendum)
You can take Tylenol 1 g every 8 hours with ibuprofen 600-800 every 6-8 hours with food to help with pain.  Take the antibiotics for concern for infection.  If this is not healing you should return to the ER or follow-up with your primary care doctor.  If you develop high fevers and the redness is spreading then please return immediately.  You should follow-up for repeat ultrasound in 7 to 10 days if you continue to have symptoms or if you develop any other new symptoms you should also return

## 2022-08-30 NOTE — ED Notes (Signed)
See triage note. Pt denies injury or trauma to area.

## 2022-08-30 NOTE — ED Provider Notes (Signed)
[facility] Provider Note    Event Date/Time   First MD Initiated Contact with Patient 08/30/22 0710     (approximate)   History   Leg Pain   HPI  Hannah Ballard is a 41 y.o. female who comes in with right leg pain.  Patient reports wearing compression stockings and after removing it she noticed some swelling and redness to it.  She does report sitting prolonged for work.  Patient denies any trauma.  She reports being able to walk fine.  She just reports pain in the foot.  She also reports an occasional spasm in her calf but no continued pain in the calf.  She reports some swelling in the foot but no swelling in the calf.  She denies any known fevers.  She does report having her gallbladder removed about 2 months ago but no other recent surgeries.  She is also had her thyroid removed but that was a year ago.  She denies any chest pain, shortness of breath.   Physical Exam   Triage Vital Signs: ED Triage Vitals  Enc Vitals Group     BP 08/30/22 0338 (!) 187/109     Pulse Rate 08/30/22 0338 (!) 116     Resp 08/30/22 0338 20     Temp 08/30/22 0338 98.7 F (37.1 C)     Temp Source 08/30/22 0338 Oral     SpO2 08/30/22 0338 97 %     Weight 08/30/22 0343 200 lb (90.7 kg)     Height 08/30/22 0343 '5\' 9"'$  (1.753 m)     Head Circumference --      Peak Flow --      Pain Score 08/30/22 0337 5     Pain Loc --      Pain Edu? --      Excl. in [place]? --     Most recent vital signs: Vitals:   08/30/22 0338  BP: (!) 187/109  Pulse: (!) 116  Resp: 20  Temp: 98.7 F (37.1 C)  SpO2: 97%     General: Awake, no distress.  CV:  Good peripheral perfusion.  Resp:  Normal effort.  Abd:  No distention.  Other:  Patient has a tattoo on R foot.  2+ distal pulse is on bilateral feet.  Her bilateral legs do look a little bit of paleness but according to partner in room that is what happens when she gets cold.  He denies this being new.  The only new thing is the redness on  the top of the right foot near the tattoo.  She denies this being a recent tattoo.  She has got a little bit of swelling noted on the foot.  No calf swelling or edema of the leg otherwise just on the foot.  She has some warmth noted over the tattoo.  No warmth on the calf.  She is able to flex and extend the ankle.  No redness on the joint of the ankle.  Patient is able to ambulate just reports some pain in her foot with ambulation.   ED Results / Procedures / Treatments   Labs (all labs ordered are listed, but only abnormal results are displayed) Labs Reviewed  CBC WITH DIFFERENTIAL/PLATELET - Abnormal; Notable for the following components:      Result Value   WBC 11.9 (*)    Neutro Abs 8.1 (*)    All other components within normal limits  BASIC METABOLIC PANEL - Abnormal; Notable for the  following components:   Glucose, Bld 110 (*)    BUN 22 (*)    All other components within normal limits     RADIOLOGY I have reviewed the ultrasound personally and interpreted and no DVT   PROCEDURES:  Critical Care performed: No  Procedures   MEDICATIONS ORDERED IN ED: Medications - No data to display   IMPRESSION / MDM / [place] / ED COURSE  I reviewed the triage vital signs and the nursing notes.   Patient's presentation is most consistent with acute presentation with potential threat to life or bodily function.  Patient initially comes in tachycardic hypertensive.  Discussed with patient her hypertension and need to follow-up in a week for recheck but this could just be from the pain.  Ultrasound ordered to evaluate for DVT.  Labs ordered given her hypertension to make sure no creatinine issues.  I suspect on examination the patient has cellulitis of the foot.  Do not see any open wounds on the bottom of the foot to suggest ulceration.  She has got no evidence of septic joint.  No crepitus to suggest necrotizing fasciitis.  She got 2+ distal pulses no evidence of arterial  issue.  Given a little bit of paleness on her bilateral legs I did consider the possibility of a venous clot up higher in her IVC but I discussed with the radiologist about her ultrasound and there were no issues with the waveforms to suggest something higher up and she had a CT scan done in September after her gallbladder surgery that did not have any evidence of clot therefore my suspicion overall is really low given her partner reports that her legs look at baseline other than the redness on the foot.   BMP shows stable creatinine.  CBC shows slightly elevated white count but does not meet sepsis criteria  IMPRESSION: No evidence of right lower extremity deep venous thrombosis.   Will treat for cellulitis.  Patient given 1 dose of oxycodone and first dose of antibiotics and she will return if symptoms are worsening.  She also return for a repeat ultrasound in 7 to 10 days if she develops worsening swelling or any other concerns      FINAL CLINICAL IMPRESSION(S) / ED DIAGNOSES   Final diagnoses:  Cellulitis of right lower extremity     Rx / DC Orders   ED Discharge Orders          Ordered    cephALEXin (KEFLEX) 500 MG capsule  2 times daily        08/30/22 5597             Note:  This document was prepared using Dragon voice recognition software and may include unintentional dictation errors.   Vanessa Folsom, MD 08/30/22 0800

## 2022-08-30 NOTE — ED Notes (Signed)
Ice pack given to pt.

## 2022-10-19 IMAGING — MR MR CERVICAL SPINE W/O CM
6 series · 48 of 48 positions shown · non-contrast
Comparison: CT 08/13/2020

CLINICAL DATA: Neck pain for 3 days. History of prior cervical
fusion in 9884

EXAM:
MRI CERVICAL SPINE WITHOUT CONTRAST
TECHNIQUE: Multiplanar, multisequence MR imaging of the cervical spine was
performed. No intravenous contrast was administered.

[Series 5: T2 · sagittal · 3.0mm · 0.89mm/px · 7 of 15 slices shown (1 of 3)]
[im 1/15]
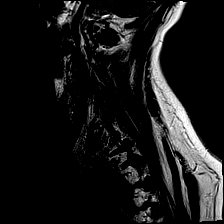
[im 3/15]
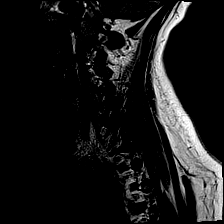
[im 5/15]
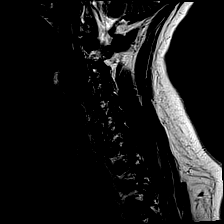
[im 8/15]
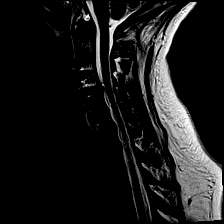
[im 10/15]
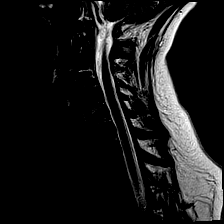
[im 12/15]
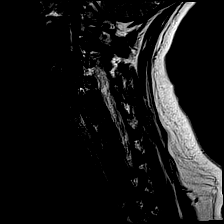
[im 15/15]
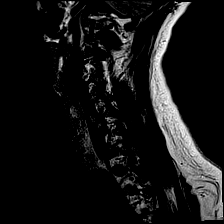

[Series 6: FLAIR · sagittal · 3.0mm · 0.89mm/px · 7 of 15 slices shown]
[im 1/15]
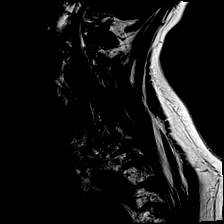
[im 3/15]
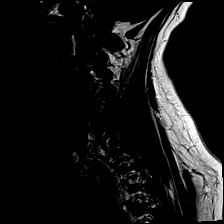
[im 5/15]
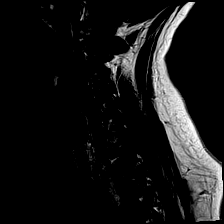
[im 8/15]
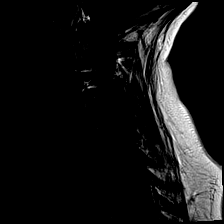
[im 10/15]
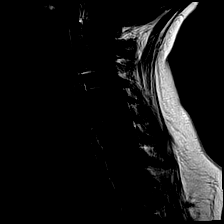
[im 12/15]
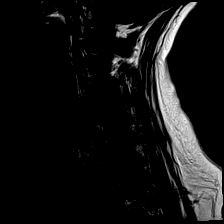
[im 15/15]
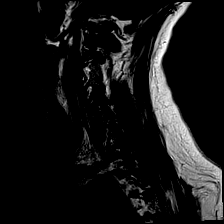

[Series 7: STIR · sagittal · 3.0mm · 0.89mm/px · 6 of 15 slices shown (1 of 2)]
[im 1/15]
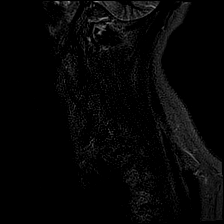
[im 3/15]
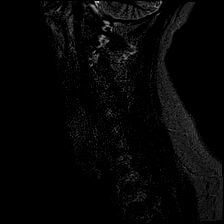
[im 6/15]
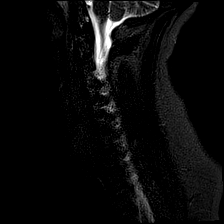
[im 9/15]
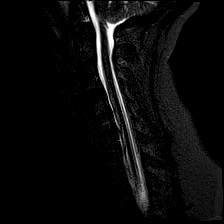
[im 12/15]
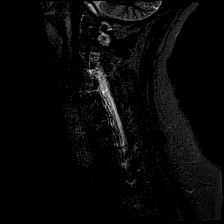
[im 15/15]
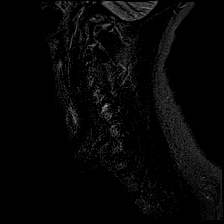

[Series 8: T2 · axial · 3.0mm · 0.80mm/px · z∈[-101,-13]mm · 11 of 27 slices shown (2 of 3)]
[im 1/27]
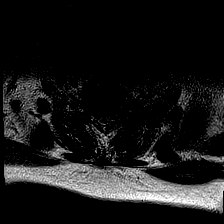
[im 3/27]
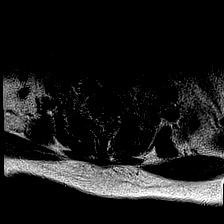
[im 6/27]
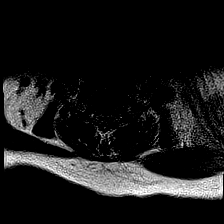
[im 8/27]
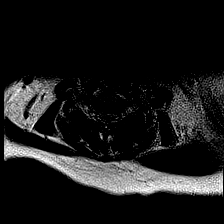
[im 11/27]
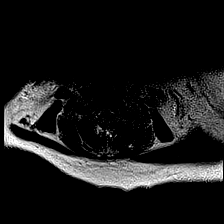
[im 14/27]
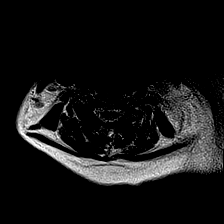
[im 16/27]
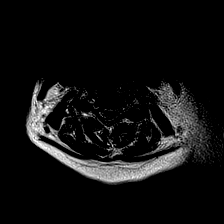
[im 19/27]
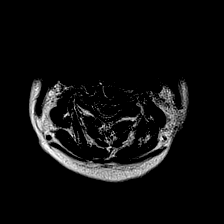
[im 21/27]
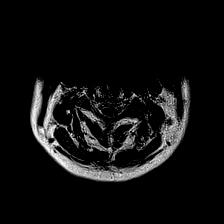
[im 24/27]
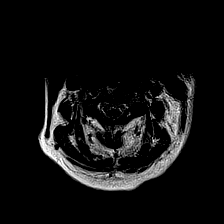
[im 27/27]
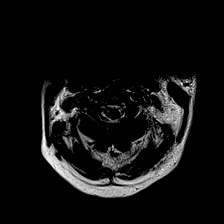

[Series 9: STIR · sagittal · 3.0mm · 0.45mm/px · 6 of 15 slices shown (2 of 2)]
[im 1/15]
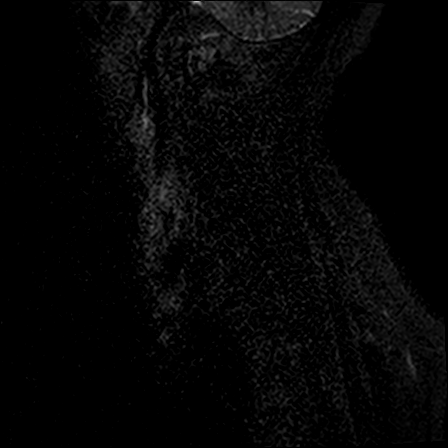
[im 3/15]
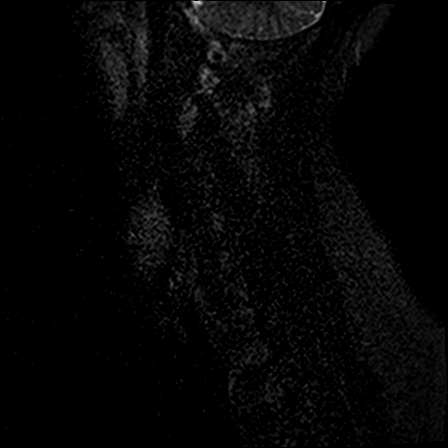
[im 6/15]
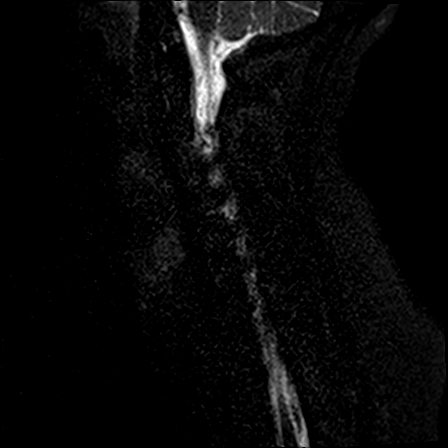
[im 9/15]
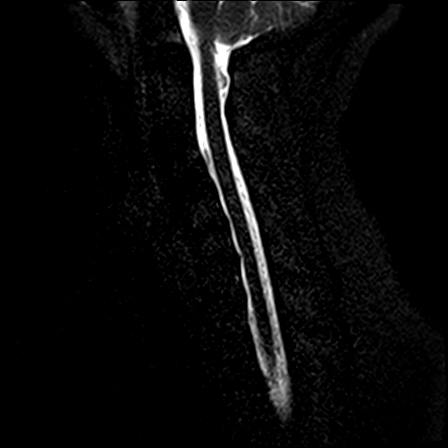
[im 12/15]
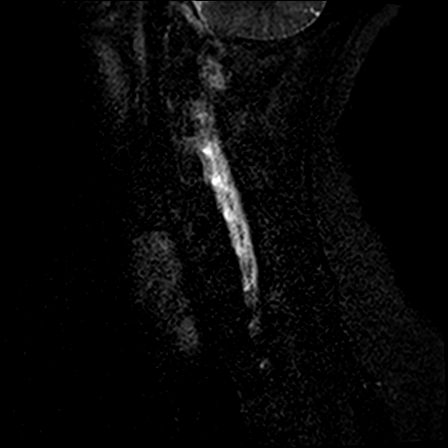
[im 15/15]
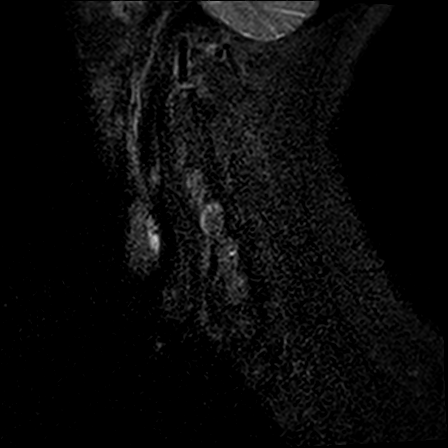

[Series 10: T2 · axial · 3.0mm · 0.40mm/px · z∈[-101,-13]mm · 11 of 27 slices shown (3 of 3)]
[im 1/27]
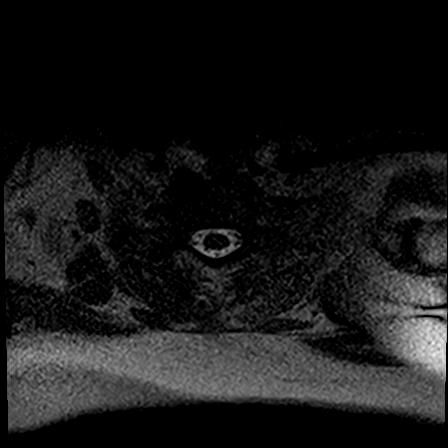
[im 3/27]
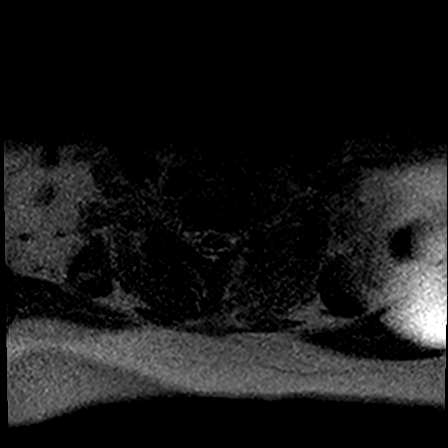
[im 6/27]
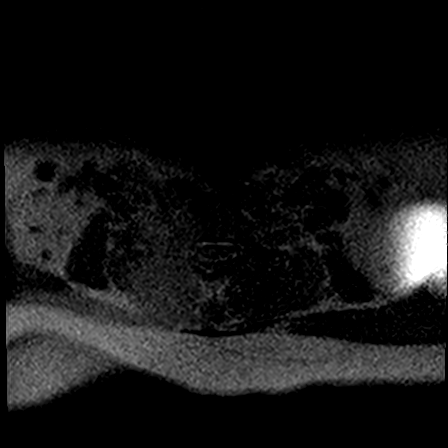
[im 8/27]
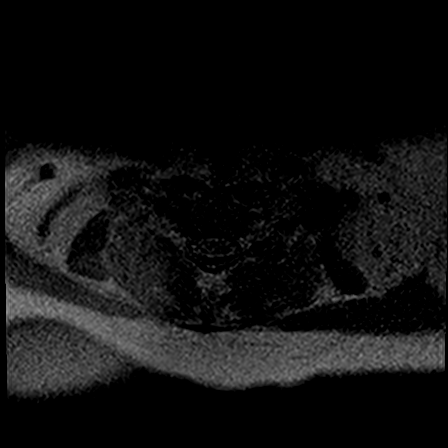
[im 11/27]
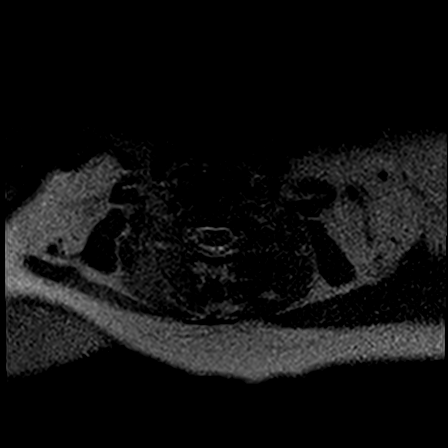
[im 14/27]
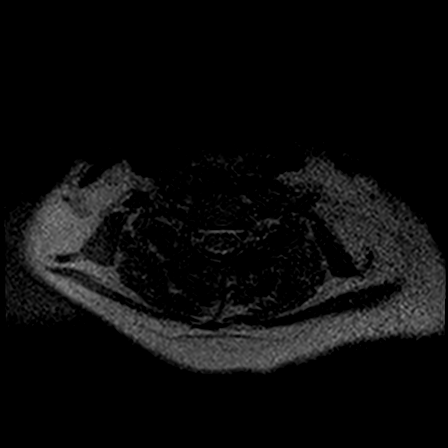
[im 16/27]
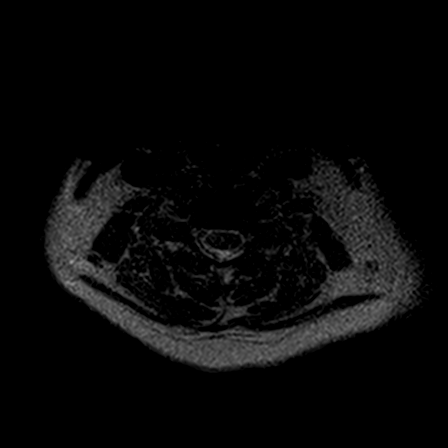
[im 19/27]
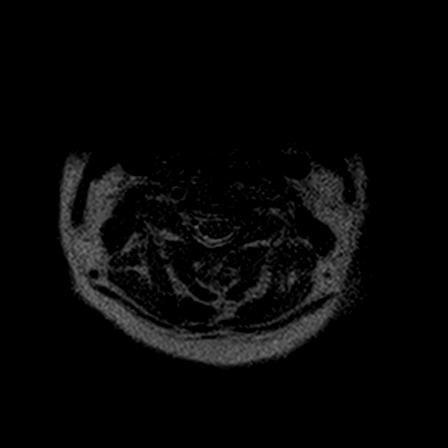
[im 21/27]
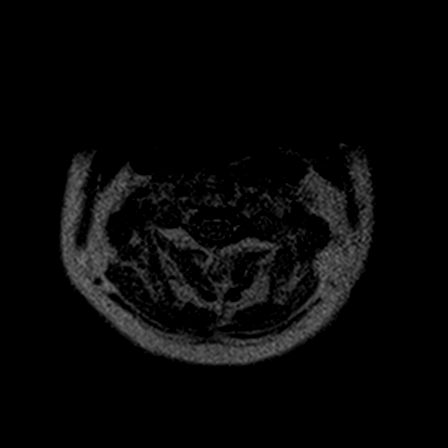
[im 24/27]
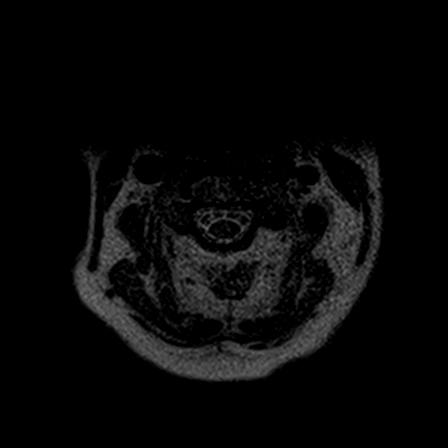
[im 27/27]
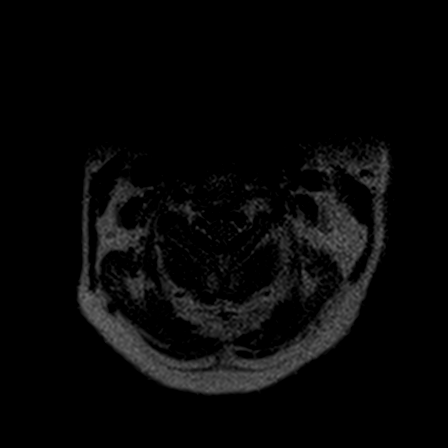

[48 of 48 positions shown; findings below may reference images not displayed]

FINDINGS: Technical note: Examination is significantly degraded by patient
motion artifact. Best possible images were obtained and submitted
for interpretation.

Alignment: Straightening of the cervical lordosis without static
listhesis.

Vertebrae: Prior C3-4 ACDF. No evidence of fracture. No evidence of
discitis. No suspicious bone lesion.

Cord: No focal cord signal abnormality evident within the
limitations of this motion degraded examination.

Posterior Fossa, vertebral arteries, paraspinal tissues: No
prevertebral fluid. No paravertebral abnormality identified.

Disc levels:

C2-C3: No significant disc protrusion. No evidence of foraminal
stenosis or canal stenosis.

C3-C4: No significant disc protrusion. No evidence of foraminal
stenosis or canal stenosis.

C4-C5: No significant disc protrusion. No evidence of foraminal
stenosis or canal stenosis.

C5-C6: No significant disc protrusion. No evidence of foraminal
stenosis or canal stenosis.

C6-C7: No significant disc protrusion. No evidence of foraminal
stenosis or canal stenosis.

C7-T1: No significant disc protrusion. No evidence of foraminal
stenosis or canal stenosis.
IMPRESSION: 1. Examination is significantly degraded by patient motion artifact.
2. No acute osseous abnormality.  No evidence of discitis.
3. Prior C3-4 ACDF.
4. No evidence of foraminal stenosis or canal stenosis of the
cervical spine.

## 2023-01-26 ENCOUNTER — Ambulatory Visit (INDEPENDENT_AMBULATORY_CARE_PROVIDER_SITE_OTHER): Payer: Commercial Managed Care - PPO | Admitting: Nurse Practitioner

## 2023-01-26 ENCOUNTER — Encounter: Payer: Self-pay | Admitting: Nurse Practitioner

## 2023-01-26 VITALS — BP 154/102 | HR 100 | Temp 98.2°F | Ht 69.0 in | Wt 234.5 lb

## 2023-01-26 DIAGNOSIS — E559 Vitamin D deficiency, unspecified: Secondary | ICD-10-CM

## 2023-01-26 DIAGNOSIS — E890 Postprocedural hypothyroidism: Secondary | ICD-10-CM | POA: Diagnosis not present

## 2023-01-26 DIAGNOSIS — Z9889 Other specified postprocedural states: Secondary | ICD-10-CM

## 2023-01-26 DIAGNOSIS — G4700 Insomnia, unspecified: Secondary | ICD-10-CM | POA: Diagnosis not present

## 2023-01-26 DIAGNOSIS — I10 Essential (primary) hypertension: Secondary | ICD-10-CM | POA: Diagnosis not present

## 2023-01-26 DIAGNOSIS — F419 Anxiety disorder, unspecified: Secondary | ICD-10-CM | POA: Diagnosis not present

## 2023-01-26 DIAGNOSIS — F32A Depression, unspecified: Secondary | ICD-10-CM

## 2023-01-26 DIAGNOSIS — R519 Headache, unspecified: Secondary | ICD-10-CM

## 2023-01-26 MED ORDER — LOSARTAN POTASSIUM 50 MG PO TABS: 50.0000 mg | ORAL_TABLET | Freq: Every day | ORAL | 1 refills | Status: DC

## 2023-01-26 MED ORDER — HYDROXYZINE PAMOATE 50 MG PO CAPS: 50.0000 mg | ORAL_CAPSULE | Freq: Three times a day (TID) | ORAL | 0 refills | Status: DC | PRN

## 2023-01-26 NOTE — Patient Instructions (Signed)
Hydroxyzine and losartab  sent to the pharmacy. Will do labs today. Advised patient to check blood pressure at home and bring readings to the next visit. Will see her back in 2 week.

## 2023-01-26 NOTE — Progress Notes (Signed)
New Patient Office Visit  Subjective    Patient ID: Hannah Ballard, female    DOB: [birth year]  Age: 42 y.o. MRN: [id number]  CC:  Chief Complaint  Patient presents with  . Establish Care  . Pain    Right side of head and behind ear.   . Nightmares    Sister was murdered 2 years and she is dreaming about that situation.     HPI Hannah Ballard presents to establish care. She has not been to PCP in a while.   She has history of   She had incomplete root canal done 6 months ago and have been to two different dentist for the root canal but were not able to numb the jaw and wanted her to general anesthesia. . She had pus pocket on the gum three months ago.and top antibiotic for it. She states from last 5 days shooting pain in the  back of ear to the jaw mainly in the evening.   She also report of sleeping issues and night mere since her sister got murdered in April 2022.   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 COVID-19 Vaccine (1) Never done  . HIV Screening  Never done  . Hepatitis C Screening  Never done  . DTaP/Tdap/Td (1 - Tdap) Never done  . PAP SMEAR-Modifier  Never done  . INFLUENZA VACCINE  04/26/2023  . HPV VACCINES  Aged Out    There are no preventive care reminders to display for this patient.  Outpatient Encounter Medications as of 01/26/2023  Medication Sig  . hydrOXYzine (VISTARIL) 50 MG capsule Take 1 capsule (50 mg total) by mouth 3 (three) times daily as needed.  Marland Kitchen losartan (COZAAR) 50 MG tablet Take 1 tablet (50 mg total) by mouth daily.  . [DISCONTINUED] levothyroxine (SYNTHROID) 137 MCG tablet Take 137 mcg by mouth daily before breakfast.  . [DISCONTINUED] pantoprazole (PROTONIX) 40 MG tablet Take 40 mg by mouth daily.  Marland Kitchen acetaminophen (TYLENOL) 500 MG tablet Take by mouth.  . ergocalciferol (VITAMIN D2) 1.25 MG (50000 UT) capsule Take by mouth. (Patient not taking: Reported on  02/09/2023)  . [DISCONTINUED] calcium-vitamin D (CAL-CITRATE PLUS VITAMIN D) 250-100 MG-UNIT tablet Take by mouth.  . [DISCONTINUED] cloNIDine (CATAPRES) 0.2 MG tablet Take by mouth.  . [DISCONTINUED] diazepam (VALIUM) 2 MG tablet Take 1 tablet (2 mg total) by mouth every 8 (eight) hours as needed for muscle spasms.  . [DISCONTINUED] furosemide (LASIX) 40 MG tablet Take by mouth.  . [DISCONTINUED] HYDROmorphone (DILAUDID) 2 MG tablet Take 1 tablet (2 mg total) by mouth every 6 (six) hours as needed for severe pain.  . [DISCONTINUED] lisinopril-hydrochlorothiazide (ZESTORETIC) 20-25 MG tablet Take 1 tablet by mouth daily.  . [DISCONTINUED] prochlorperazine (COMPAZINE) 10 MG tablet Take 1 tablet (10 mg total) by mouth every 6 (six) hours as needed for nausea or vomiting.  . [DISCONTINUED] promethazine (PHENERGAN) 25 MG suppository Place 1 suppository (25 mg total) rectally every 6 (six) hours as needed for nausea or vomiting.  . [DISCONTINUED] topiramate (TOPAMAX) 50 MG tablet Take 1 tablet (50 mg total) by mouth 2 (two) times daily.   No facility-administered encounter medications on file as of 01/26/2023.    Past Medical History:  Diagnosis Date  . Cancer (HCC)   . Hypertension   . Migraines     Past Surgical History:  Procedure Laterality Date  .  ABDOMINAL HYSTERECTOMY    . FRACTURE SURGERY    . NO PAST SURGERIES    . SPINE SURGERY      Family History  Problem Relation Age of Onset  . Healthy Mother   . Healthy Father     Social History   Socioeconomic History  . Marital status: Married    Spouse name: Not on file  . Number of children: Not on file  . Years of education: Not on file  . Highest education level: Associate degree: occupational, Scientist, product/process development, or vocational program  Occupational History  . Not on file  Tobacco Use  . Smoking status: Every Day  . Smokeless tobacco: Never  Vaping Use  . Vaping Use: Never used  Substance and Sexual Activity  . Alcohol use: Never   . Drug use: Never  . Sexual activity: Not on file  Other Topics Concern  . Not on file  Social History Narrative  . Not on file   Social Determinants of Health   Financial Resource Strain: High Risk (02/07/2023)   Overall Financial Resource Strain (CARDIA)   . Difficulty of Paying Living Expenses: Hard  Food Insecurity: Food Insecurity Present (02/07/2023)   Hunger Vital Sign   . Worried About Programme researcher, broadcasting/film/video in the Last Year: Sometimes true   . Ran Out of Food in the Last Year: Sometimes true  Transportation Needs: Unmet Transportation Needs (02/07/2023)   PRAPARE - Transportation   . Lack of Transportation (Medical): Yes   . Lack of Transportation (Non-Medical): Yes  Physical Activity: Sufficiently Active (02/07/2023)   Exercise Vital Sign   . Days of Exercise per Week: 5 days   . Minutes of Exercise per Session: 60 min  Stress: Stress Concern Present (02/07/2023)   [employer] of Occupational Health - Occupational Stress Questionnaire   . Feeling of Stress : Very much  Social Connections: Socially Integrated (02/07/2023)   Social Connection and Isolation Panel [NHANES]   . Frequency of Communication with Friends and Family: Three times a week   . Frequency of Social Gatherings with Friends and Family: Once a week   . Attends Religious Services: More than 4 times per year   . Active Member of Clubs or Organizations: Yes   . Attends Banker Meetings: More than 4 times per year   . Marital Status: Married  Catering manager Violence: Not on file    ROS      Objective    BP (!) 154/102   Pulse 100   Temp 98.2 F (36.8 C) (Oral)   Ht 5\' 9"  (1.753 m)   Wt 234 lb 8 oz (106.4 kg)   SpO2 97%   BMI 34.63 kg/m   Physical Exam  {Labs (Optional):23779}    Assessment & Plan:  H/O thyroidectomy -     TSH; Future  Vitamin D deficiency -     VITAMIN D 25 Hydroxy (Vit-D Deficiency, Fractures); Future  Hypertension, unspecified type -     CBC with  Differential/Platelet; Future -     Comprehensive metabolic panel; Future -     Lipid panel; Future  Throbbing headache -     CT HEAD WO CONTRAST ( )  Other orders -     hydrOXYzine Pamoate; Take 1 capsule (50 mg total) by mouth 3 (three) times daily as needed.  Dispense: 30 capsule; Refill: 0 -     Losartan Potassium; Take 1 tablet (50 mg total) by mouth daily.  Dispense: 90 tablet; Refill:  1    Return in about 2 weeks (around 02/09/2023).   [name], NP

## 2023-01-27 DIAGNOSIS — R519 Headache, unspecified: Secondary | ICD-10-CM | POA: Insufficient documentation

## 2023-01-30 ENCOUNTER — Telehealth: Payer: Self-pay | Admitting: Nurse Practitioner

## 2023-01-30 NOTE — Telephone Encounter (Signed)
Lft pt vm to call ofc to sch CT. thanks 

## 2023-01-31 ENCOUNTER — Other Ambulatory Visit: Payer: Commercial Managed Care - PPO

## 2023-02-01 ENCOUNTER — Telehealth: Payer: Self-pay | Admitting: Nurse Practitioner

## 2023-02-01 ENCOUNTER — Other Ambulatory Visit: Payer: Commercial Managed Care - PPO

## 2023-02-01 NOTE — Telephone Encounter (Signed)
Lft pt vm to call ofc to sch CT. thanks 

## 2023-02-02 ENCOUNTER — Other Ambulatory Visit: Payer: Commercial Managed Care - PPO

## 2023-02-07 ENCOUNTER — Other Ambulatory Visit: Payer: Self-pay | Admitting: Nurse Practitioner

## 2023-02-07 ENCOUNTER — Ambulatory Visit
Admission: RE | Admit: 2023-02-07 | Discharge: 2023-02-07 | Disposition: A | Discharge status: Home / Self Care | Payer: Commercial Managed Care - PPO | Source: Ambulatory Visit | Attending: Nurse Practitioner | Admitting: Nurse Practitioner

## 2023-02-07 ENCOUNTER — Other Ambulatory Visit (INDEPENDENT_AMBULATORY_CARE_PROVIDER_SITE_OTHER): Payer: Commercial Managed Care - PPO

## 2023-02-07 DIAGNOSIS — I10 Essential (primary) hypertension: Secondary | ICD-10-CM

## 2023-02-07 DIAGNOSIS — R519 Headache, unspecified: Secondary | ICD-10-CM | POA: Insufficient documentation

## 2023-02-07 DIAGNOSIS — E890 Postprocedural hypothyroidism: Secondary | ICD-10-CM | POA: Diagnosis not present

## 2023-02-07 DIAGNOSIS — E559 Vitamin D deficiency, unspecified: Secondary | ICD-10-CM

## 2023-02-07 LAB — CBC WITH DIFFERENTIAL/PLATELET
Basophils Absolute: 0 10*3/uL (ref 0.0–0.1)
Basophils Relative: 0.6 % (ref 0.0–3.0)
Eosinophils Absolute: 0.2 10*3/uL (ref 0.0–0.7)
Eosinophils Relative: 2 % (ref 0.0–5.0)
HCT: 40.8 % (ref 36.0–46.0)
Hemoglobin: 13.6 g/dL (ref 12.0–15.0)
Lymphocytes Relative: 30.1 % (ref 12.0–46.0)
Lymphs Abs: 2.6 10*3/uL (ref 0.7–4.0)
MCHC: 33.4 g/dL (ref 30.0–36.0)
MCV: 93.6 fl (ref 78.0–100.0)
Monocytes Absolute: 0.5 10*3/uL (ref 0.1–1.0)
Monocytes Relative: 6.3 % (ref 3.0–12.0)
Neutro Abs: 5.3 10*3/uL (ref 1.4–7.7)
Neutrophils Relative %: 61 % (ref 43.0–77.0)
Platelets: 333 10*3/uL (ref 150.0–400.0)
RBC: 4.36 Mil/uL (ref 3.87–5.11)
RDW: 13.2 % (ref 11.5–15.5)
WBC: 8.6 10*3/uL (ref 4.0–10.5)

## 2023-02-07 LAB — COMPREHENSIVE METABOLIC PANEL
ALT: 20 U/L (ref 0–35)
AST: 20 U/L (ref 0–37)
Albumin: 4.4 g/dL (ref 3.5–5.2)
Alkaline Phosphatase: 102 U/L (ref 39–117)
BUN: 16 mg/dL (ref 6–23)
CO2: 27 mEq/L (ref 19–32)
Calcium: 8.4 mg/dL (ref 8.4–10.5)
Chloride: 103 mEq/L (ref 96–112)
Creatinine, Ser: 0.75 mg/dL (ref 0.40–1.20)
GFR: 99.17 mL/min (ref >60.00)
Glucose, Bld: 83 mg/dL (ref 70–99)
Potassium: 3.8 mEq/L (ref 3.5–5.1)
Sodium: 139 mEq/L (ref 135–145)
Total Bilirubin: 0.4 mg/dL (ref 0.2–1.2)
Total Protein: 7.6 g/dL (ref 6.0–8.3)

## 2023-02-07 LAB — LIPID PANEL
Cholesterol: 171 mg/dL (ref 0–200)
HDL: 65.3 mg/dL (ref >39.00)
LDL Cholesterol: 88 mg/dL (ref 0–99)
NonHDL: 105.74
Total CHOL/HDL Ratio: 3
Triglycerides: 88 mg/dL (ref 0.0–149.0)
VLDL: 17.6 mg/dL (ref 0.0–40.0)

## 2023-02-07 LAB — TSH: TSH: 23.28 u[IU]/mL — ABNORMAL HIGH (ref 0.35–5.50)

## 2023-02-07 LAB — VITAMIN D 25 HYDROXY (VIT D DEFICIENCY, FRACTURES): VITD: 15.81 ng/mL — ABNORMAL LOW (ref 30.00–100.00)

## 2023-02-07 NOTE — Telephone Encounter (Signed)
All are historical medications refilled by a different provider. Is it okay to refill?

## 2023-02-08 ENCOUNTER — Other Ambulatory Visit: Payer: Self-pay | Admitting: Nurse Practitioner

## 2023-02-08 ENCOUNTER — Ambulatory Visit (INDEPENDENT_AMBULATORY_CARE_PROVIDER_SITE_OTHER): Payer: Commercial Managed Care - PPO

## 2023-02-08 DIAGNOSIS — R7989 Other specified abnormal findings of blood chemistry: Secondary | ICD-10-CM

## 2023-02-08 DIAGNOSIS — R928 Other abnormal and inconclusive findings on diagnostic imaging of breast: Secondary | ICD-10-CM

## 2023-02-08 DIAGNOSIS — Z1231 Encounter for screening mammogram for malignant neoplasm of breast: Secondary | ICD-10-CM

## 2023-02-08 LAB — T4, FREE: Free T4: 0.78 ng/dL (ref 0.60–1.60)

## 2023-02-08 LAB — T3, FREE: T3, Free: 3.1 pg/mL (ref 2.3–4.2)

## 2023-02-08 MED ORDER — PANTOPRAZOLE SODIUM 40 MG PO TBEC: 40.0000 mg | DELAYED_RELEASE_TABLET | Freq: Every day | ORAL | 1 refills | Status: DC

## 2023-02-08 MED ORDER — LEVOTHYROXINE SODIUM 137 MCG PO TABS: 137.0000 ug | ORAL_TABLET | Freq: Every day | ORAL | 2 refills | Status: DC

## 2023-02-08 NOTE — Addendum Note (Signed)
Addended by: Warden Fillers on: 02/08/2023 09:06 AM   Modules accepted: Orders

## 2023-02-09 ENCOUNTER — Ambulatory Visit (INDEPENDENT_AMBULATORY_CARE_PROVIDER_SITE_OTHER): Payer: Commercial Managed Care - PPO | Admitting: Nurse Practitioner

## 2023-02-09 ENCOUNTER — Encounter: Payer: Self-pay | Admitting: Nurse Practitioner

## 2023-02-09 VITALS — BP 150/88 | HR 93 | Temp 98.0°F | Ht 69.0 in | Wt 234.2 lb

## 2023-02-09 DIAGNOSIS — Z9889 Other specified postprocedural states: Secondary | ICD-10-CM

## 2023-02-09 DIAGNOSIS — E039 Hypothyroidism, unspecified: Secondary | ICD-10-CM | POA: Diagnosis not present

## 2023-02-09 DIAGNOSIS — I10 Essential (primary) hypertension: Secondary | ICD-10-CM | POA: Diagnosis not present

## 2023-02-09 DIAGNOSIS — F32A Depression, unspecified: Secondary | ICD-10-CM | POA: Diagnosis not present

## 2023-02-09 DIAGNOSIS — R519 Headache, unspecified: Secondary | ICD-10-CM

## 2023-02-09 DIAGNOSIS — F419 Anxiety disorder, unspecified: Secondary | ICD-10-CM | POA: Diagnosis not present

## 2023-02-09 DIAGNOSIS — E559 Vitamin D deficiency, unspecified: Secondary | ICD-10-CM | POA: Diagnosis not present

## 2023-02-09 DIAGNOSIS — G4700 Insomnia, unspecified: Secondary | ICD-10-CM

## 2023-02-09 DIAGNOSIS — I159 Secondary hypertension, unspecified: Secondary | ICD-10-CM

## 2023-02-09 MED ORDER — AMITRIPTYLINE HCL 25 MG PO TABS
25.0000 mg | ORAL_TABLET | Freq: Every day | ORAL | 1 refills | Status: DC
Start: 2023-02-09 — End: 2023-03-16

## 2023-02-09 NOTE — Patient Instructions (Addendum)
Will send referral to Psych and neurosurgeon.  Rx sent to pharmacy.

## 2023-02-09 NOTE — Progress Notes (Signed)
Established Patient Office Visit  Subjective:  Patient ID: Hannah Ballard, female    DOB: [birth year]  Age: 42 y.o. MRN: [id number]  CC:  Chief Complaint  Patient presents with   Follow-up    2 week f/u   Weight Management Screening    Want to discuss starting on Wegovy-had a gastric bypass    HPI  Hannah Ballard presents for  2 weeks follow up on the labs, hypertension and CT result.   The CT of the head showed no acute intracranial pathology for headache.  She has history of cervical spine surgery.  She would like to to see a neurosurgeon for the headaches that she has been experiencing mostly during the evening times.  She tried hydroxyzine for sleep but it did not help.  She has also tried trazodone and Ambien for sleep in the past but it did not help.  She had tried SSRIs and SNRIs for anxiety and depression but stated it does not help either.  She also has a pending root canal upper molar.  Advised patient to follow-up with the dentist and complete the treatment.  She had few bruising on the bilaterally patient states that she had a fall.  HPI   Past Medical History:  Diagnosis Date   Cancer (HCC)    Hypertension    Migraines     Past Surgical History:  Procedure Laterality Date   ABDOMINAL HYSTERECTOMY  2018   ANTERIOR FUSION CERVICAL SPINE  2008   APPENDECTOMY  1997   CHOLECYSTECTOMY     FRACTURE SURGERY     SPINE SURGERY      Family History  Problem Relation Age of Onset   Healthy Mother    Healthy Father     Social History   Socioeconomic History   Marital status: Married    Spouse name: Not on file   Number of children: Not on file   Years of education: Not on file   Highest education level: Associate degree: occupational, Scientist, product/process development, or vocational program  Occupational History   Not on file  Tobacco Use   Smoking status: Every Day   Smokeless tobacco: Never  Vaping Use   Vaping Use: Never used  Substance and Sexual Activity   Alcohol use: Never    Drug use: Never   Sexual activity: Not on file  Other Topics Concern   Not on file  Social History Narrative   Not on file   Social Determinants of Health   Financial Resource Strain: High Risk (02/07/2023)   Overall Financial Resource Strain (CARDIA)    Difficulty of Paying Living Expenses: Hard  Food Insecurity: Food Insecurity Present (02/07/2023)   Hunger Vital Sign    Worried About Running Out of Food in the Last Year: Sometimes true    Ran Out of Food in the Last Year: Sometimes true  Transportation Needs: Unmet Transportation Needs (02/07/2023)   PRAPARE - Transportation    Lack of Transportation (Medical): Yes    Lack of Transportation (Non-Medical): Yes  Physical Activity: Sufficiently Active (02/07/2023)   Exercise Vital Sign    Days of Exercise per Week: 5 days    Minutes of Exercise per Session: 60 min  Stress: Stress Concern Present (02/07/2023)   [employer] of Occupational Health - Occupational Stress Questionnaire    Feeling of Stress : Very much  Social Connections: Socially Integrated (02/07/2023)   Social Connection and Isolation Panel [NHANES]    Frequency of Communication with Friends and Family: Three  times a week    Frequency of Social Gatherings with Friends and Family: Once a week    Attends Religious Services: More than 4 times per year    Active Member of [employer] or Organizations: Yes    Attends Engineer, structural: More than 4 times per year    Marital Status: Married  Catering manager Violence: Not on file     Outpatient Medications Prior to Visit  Medication Sig Dispense Refill   acetaminophen (TYLENOL) 500 MG tablet Take by mouth.     hydrOXYzine (VISTARIL) 50 MG capsule Take 1 capsule (50 mg total) by mouth 3 (three) times daily as needed. 30 capsule 0   losartan (COZAAR) 50 MG tablet Take 1 tablet (50 mg total) by mouth daily. 90 tablet 1   pantoprazole (PROTONIX) 40 MG tablet Take 1 tablet (40 mg total) by mouth daily. 30 tablet  1   levothyroxine (SYNTHROID) 137 MCG tablet Take 1 tablet (137 mcg total) by mouth daily before breakfast. 90 tablet 2   ergocalciferol (VITAMIN D2) 1.25 MG (50000 UT) capsule Take by mouth. (Patient not taking: Reported on 02/09/2023)     No facility-administered medications prior to visit.    Allergies  Allergen Reactions   Aspirin Swelling and Anaphylaxis    Tongue swelling Other reaction(s): Edema face/lips/tongue Facial swelling    Misc. Sulfonamide Containing Compounds Swelling    Facial swelling Facial swelling    Morphine And Codeine Anaphylaxis   Morphine    Sulfa Antibiotics Swelling    Facial swelling    ROS Review of Systems  Constitutional: Negative.   HENT: Negative.    Respiratory: Negative.    Cardiovascular: Negative.   Gastrointestinal: Negative.   Genitourinary: Negative.   Musculoskeletal: Negative.   Neurological:  Positive for headaches.  Psychiatric/Behavioral:  The patient is nervous/anxious.       Objective:    Physical Exam Constitutional:      Appearance: Normal appearance. She is obese.  HENT:     Head: Normocephalic.     Right Ear: Tympanic membrane normal.     Left Ear: Tympanic membrane normal.     Nose: Nose normal.     Mouth/Throat:     Mouth: Mucous membranes are moist.     Pharynx: Oropharynx is clear.  Eyes:     Extraocular Movements: Extraocular movements intact.     Conjunctiva/sclera: Conjunctivae normal.     Pupils: Pupils are equal, round, and reactive to light.  Cardiovascular:     Rate and Rhythm: Normal rate and regular rhythm.     Pulses: Normal pulses.     Heart sounds: Normal heart sounds.  Pulmonary:     Effort: Pulmonary effort is normal. No respiratory distress.     Breath sounds: Normal breath sounds. No rhonchi.  Abdominal:     General: Bowel sounds are normal.     Palpations: Abdomen is soft. There is no mass.     Tenderness: There is no abdominal tenderness.     Hernia: No hernia is present.   Musculoskeletal:        General: Normal range of motion.     Cervical back: Neck supple. No tenderness.  Skin:    Capillary Refill: Capillary refill takes less than 2 seconds.     Findings: Bruising (Bilaterally lower arm) present.  Neurological:     General: No focal deficit present.     Mental Status: She is alert and oriented to person, place, and time. Mental status  is at baseline.  Psychiatric:        Mood and Affect: Mood normal.        Behavior: Behavior normal.        Thought Content: Thought content normal.        Judgment: Judgment normal.   Recommend a form to continue  BP (!) 150/88   Pulse 93   Temp 98 F (36.7 C) (Oral)   Ht 5\' 9"  (1.753 m)   Wt 234 lb 4 oz (106.3 kg)   SpO2 99%   BMI 34.59 kg/m  Wt Readings from Last 3 Encounters:  02/09/23 234 lb 4 oz (106.3 kg)  01/26/23 234 lb 8 oz (106.4 kg)  08/30/22 200 lb (90.7 kg)     Health Maintenance  Topic Date Due   COVID-19 Vaccine (1) Never done   HIV Screening  Never done   Hepatitis C Screening  Never done   DTaP/Tdap/Td (1 - Tdap) Never done   PAP SMEAR-Modifier  Never done   INFLUENZA VACCINE  04/26/2023   HPV VACCINES  Aged Out    There are no preventive care reminders to display for this patient.  Lab Results  Component Value Date   TSH 23.28 (H) 02/07/2023   Lab Results  Component Value Date   WBC 8.6 02/07/2023   HGB 13.6 02/07/2023   HCT 40.8 02/07/2023   MCV 93.6 02/07/2023   PLT 333.0 02/07/2023   Lab Results  Component Value Date   NA 139 02/07/2023   K 3.8 02/07/2023   CO2 27 02/07/2023   GLUCOSE 83 02/07/2023   BUN 16 02/07/2023   CREATININE 0.75 02/07/2023   BILITOT 0.4 02/07/2023   ALKPHOS 102 02/07/2023   AST 20 02/07/2023   ALT 20 02/07/2023   PROT 7.6 02/07/2023   ALBUMIN 4.4 02/07/2023   CALCIUM 8.4 02/07/2023   ANIONGAP 8 08/30/2022   GFR 99.17 02/07/2023   Lab Results  Component Value Date   CHOL 171 02/07/2023   Lab Results  Component Value Date    HDL 65.30 02/07/2023   Lab Results  Component Value Date   LDLCALC 88 02/07/2023   Lab Results  Component Value Date   TRIG 88.0 02/07/2023   Lab Results  Component Value Date   CHOLHDL 3 02/07/2023   No results found for: "HGBA1C"    Assessment & Plan:  Acquired hypothyroidism Assessment & Plan: TSH elevated but T3 and T4 are within normal limits. Will continue the current medication regimen 137 mcg.   Acute nonintractable headache, unspecified headache type Assessment & Plan: CT within normal limit. Etiology unclear. Differential considered for the patient include but are not limited to insomnia, tension headache, dehydration and  eyestrain.     Vitamin D deficiency  Anxiety and depression -     Ambulatory referral to Psychiatry  Hx of cervical spine surgery Assessment & Plan: Referral sent to neurosurgeon patient requested  Orders: -     Ambulatory referral to Neurosurgery  Primary hypertension Assessment & Plan: Patient BP  Vitals:   02/09/23 0804 02/09/23 0847  BP: (!) 144/98 (!) 150/88    in the office. Advised pt to follow a low sodium and heart healthy diet. Continue losartan 50 mg daily. Will continue to monitor.    Insomnia, unspecified type Assessment & Plan: Will try amitriptyline for sleep.   Other orders -     Amitriptyline HCl; Take 1 tablet (25 mg total) by mouth at bedtime.  Dispense: 30 tablet;  Refill: 1 -     Levothyroxine Sodium; Take 1 tablet (150 mcg total) by mouth daily.  Dispense: 90 tablet; Refill: 3 -     Zepbound; Inject 2.5 mg into the skin once a week.  Dispense: 0.5 mL; Refill: 4    Follow-up: Return in about 1 month (around 03/12/2023).   [name], NP

## 2023-02-11 ENCOUNTER — Encounter: Payer: Self-pay | Admitting: Nurse Practitioner

## 2023-02-11 NOTE — Progress Notes (Incomplete)
New Patient Office Visit  Subjective    Patient ID: Hannah Ballard, female    DOB: [birth year]  Age: 42 y.o. MRN: [id number]  CC:  Chief Complaint  Patient presents with  . Establish Care  . Pain    Right side of head and behind ear.   . Nightmares    Sister was murdered 2 years and she is dreaming about that situation.     HPI Hannah Ballard presents to establish care. She has not been to PCP in a while.   She has history of hypertension, headache, GI ulcer,    She had incomplete root canal done 6 months ago and have been to two different dentist for the root canal but were not able to numb the jaw and wanted her to general anesthesia. . She had pus pocket on the gum three months ago.and top antibiotic for it. She states from last 5 days shooting pain in the  back of ear to the jaw mainly in the evening.   She also report of sleeping issues and night mere since her sister got murdered in April 2022.   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 COVID-19 Vaccine (1) Never done  . HIV Screening  Never done  . Hepatitis C Screening  Never done  . DTaP/Tdap/Td (1 - Tdap) Never done  . PAP SMEAR-Modifier  Never done  . INFLUENZA VACCINE  04/26/2023  . HPV VACCINES  Aged Out    There are no preventive care reminders to display for this patient.  Outpatient Encounter Medications as of 01/26/2023  Medication Sig  . hydrOXYzine (VISTARIL) 50 MG capsule Take 1 capsule (50 mg total) by mouth 3 (three) times daily as needed.  Marland Kitchen losartan (COZAAR) 50 MG tablet Take 1 tablet (50 mg total) by mouth daily.  . [DISCONTINUED] levothyroxine (SYNTHROID) 137 MCG tablet Take 137 mcg by mouth daily before breakfast.  . [DISCONTINUED] pantoprazole (PROTONIX) 40 MG tablet Take 40 mg by mouth daily.  Marland Kitchen acetaminophen (TYLENOL) 500 MG tablet Take by mouth.  . ergocalciferol (VITAMIN D2) 1.25 MG (50000 UT) capsule Take by mouth.  (Patient not taking: Reported on 02/09/2023)  . [DISCONTINUED] calcium-vitamin D (CAL-CITRATE PLUS VITAMIN D) 250-100 MG-UNIT tablet Take by mouth.  . [DISCONTINUED] cloNIDine (CATAPRES) 0.2 MG tablet Take by mouth.  . [DISCONTINUED] diazepam (VALIUM) 2 MG tablet Take 1 tablet (2 mg total) by mouth every 8 (eight) hours as needed for muscle spasms.  . [DISCONTINUED] furosemide (LASIX) 40 MG tablet Take by mouth.  . [DISCONTINUED] HYDROmorphone (DILAUDID) 2 MG tablet Take 1 tablet (2 mg total) by mouth every 6 (six) hours as needed for severe pain.  . [DISCONTINUED] lisinopril-hydrochlorothiazide (ZESTORETIC) 20-25 MG tablet Take 1 tablet by mouth daily.  . [DISCONTINUED] prochlorperazine (COMPAZINE) 10 MG tablet Take 1 tablet (10 mg total) by mouth every 6 (six) hours as needed for nausea or vomiting.  . [DISCONTINUED] promethazine (PHENERGAN) 25 MG suppository Place 1 suppository (25 mg total) rectally every 6 (six) hours as needed for nausea or vomiting.  . [DISCONTINUED] topiramate (TOPAMAX) 50 MG tablet Take 1 tablet (50 mg total) by mouth 2 (two) times daily.   No facility-administered encounter medications on file as of 01/26/2023.    Past Medical History:  Diagnosis Date  . Cancer (HCC)   . Hypertension   . Migraines     Past Surgical  History:  Procedure Laterality Date  . ABDOMINAL HYSTERECTOMY    . FRACTURE SURGERY    . NO PAST SURGERIES    . SPINE SURGERY      Family History  Problem Relation Age of Onset  . Healthy Mother   . Healthy Father     Social History   Socioeconomic History  . Marital status: Married    Spouse name: Not on file  . Number of children: Not on file  . Years of education: Not on file  . Highest education level: Associate degree: occupational, Scientist, product/process development, or vocational program  Occupational History  . Not on file  Tobacco Use  . Smoking status: Every Day  . Smokeless tobacco: Never  Vaping Use  . Vaping Use: Never used  Substance and  Sexual Activity  . Alcohol use: Never  . Drug use: Never  . Sexual activity: Not on file  Other Topics Concern  . Not on file  Social History Narrative  . Not on file   Social Determinants of Health   Financial Resource Strain: High Risk (02/07/2023)   Overall Financial Resource Strain (CARDIA)   . Difficulty of Paying Living Expenses: Hard  Food Insecurity: Food Insecurity Present (02/07/2023)   Hunger Vital Sign   . Worried About Programme researcher, broadcasting/film/video in the Last Year: Sometimes true   . Ran Out of Food in the Last Year: Sometimes true  Transportation Needs: Unmet Transportation Needs (02/07/2023)   PRAPARE - Transportation   . Lack of Transportation (Medical): Yes   . Lack of Transportation (Non-Medical): Yes  Physical Activity: Sufficiently Active (02/07/2023)   Exercise Vital Sign   . Days of Exercise per Week: 5 days   . Minutes of Exercise per Session: 60 min  Stress: Stress Concern Present (02/07/2023)   [employer] of Occupational Health - Occupational Stress Questionnaire   . Feeling of Stress : Very much  Social Connections: Socially Integrated (02/07/2023)   Social Connection and Isolation Panel [NHANES]   . Frequency of Communication with Friends and Family: Three times a week   . Frequency of Social Gatherings with Friends and Family: Once a week   . Attends Religious Services: More than 4 times per year   . Active Member of Clubs or Organizations: Yes   . Attends Banker Meetings: More than 4 times per year   . Marital Status: Married  Catering manager Violence: Not on file    ROS      Objective    BP (!) 154/102   Pulse 100   Temp 98.2 F (36.8 C) (Oral)   Ht 5\' 9"  (1.753 m)   Wt 234 lb 8 oz (106.4 kg)   SpO2 97%   BMI 34.63 kg/m   Physical Exam  {Labs (Optional):23779}    Assessment & Plan:  H/O thyroidectomy -     TSH; Future  Vitamin D deficiency -     VITAMIN D 25 Hydroxy (Vit-D Deficiency, Fractures);  Future  Hypertension, unspecified type -     CBC with Differential/Platelet; Future -     Comprehensive metabolic panel; Future -     Lipid panel; Future  Throbbing headache -     CT HEAD WO CONTRAST ( )  Other orders -     hydrOXYzine Pamoate; Take 1 capsule (50 mg total) by mouth 3 (three) times daily as needed.  Dispense: 30 capsule; Refill: 0 -     Losartan Potassium; Take 1 tablet (50 mg total) by  mouth daily.  Dispense: 90 tablet; Refill: 1    Return in about 2 weeks (around 02/09/2023).   [name], NP

## 2023-02-12 ENCOUNTER — Ambulatory Visit: Payer: Commercial Managed Care - PPO

## 2023-02-12 DIAGNOSIS — F32A Depression, unspecified: Secondary | ICD-10-CM | POA: Insufficient documentation

## 2023-02-12 DIAGNOSIS — G4700 Insomnia, unspecified: Secondary | ICD-10-CM | POA: Insufficient documentation

## 2023-02-12 MED ORDER — LEVOTHYROXINE SODIUM 150 MCG PO TABS: 150.0000 ug | ORAL_TABLET | Freq: Every day | ORAL | 3 refills | Status: DC

## 2023-02-12 NOTE — Assessment & Plan Note (Signed)
CT of the head ordered.

## 2023-02-12 NOTE — Assessment & Plan Note (Signed)
PHQ-9 score 18 and GAD score 15 Referral sent to psych. Advised patient to perform deep breathing exercises, journaling and meditation.

## 2023-02-12 NOTE — Assessment & Plan Note (Signed)
Will check vitamin D levels. Continue taking vitamin D supplements

## 2023-02-12 NOTE — Assessment & Plan Note (Signed)
Will try hydroxyzine for insomnia. Advised patient to proper sleep hygiene.

## 2023-02-12 NOTE — Assessment & Plan Note (Signed)
Patient BP  Vitals:   01/26/23 1433 01/26/23 1523  BP: (!) 150/110 (!) 154/102    in the office. Advised pt to follow a low sodium and heart healthy diet. Advised to check blood pressure at home Refilled losartan 50 mg

## 2023-02-15 ENCOUNTER — Encounter (INDEPENDENT_AMBULATORY_CARE_PROVIDER_SITE_OTHER): Payer: Self-pay

## 2023-02-15 MED ORDER — ZEPBOUND 2.5 MG/0.5ML ~~LOC~~ SOAJ
2.5000 mg | SUBCUTANEOUS | 4 refills | Status: DC
Start: 2023-02-15 — End: 2023-02-23

## 2023-02-19 DIAGNOSIS — Z9889 Other specified postprocedural states: Secondary | ICD-10-CM | POA: Insufficient documentation

## 2023-02-19 DIAGNOSIS — E039 Hypothyroidism, unspecified: Secondary | ICD-10-CM | POA: Insufficient documentation

## 2023-02-19 NOTE — Assessment & Plan Note (Signed)
Will try amitriptyline for sleep.

## 2023-02-19 NOTE — Assessment & Plan Note (Signed)
TSH elevated but T3 and T4 are within normal limits. Will continue the current medication regimen 137 mcg.

## 2023-02-19 NOTE — Assessment & Plan Note (Signed)
CT within normal limit. Etiology unclear. Differential considered for the patient include but are not limited to insomnia, tension headache, dehydration and  eyestrain.

## 2023-02-19 NOTE — Assessment & Plan Note (Signed)
Referral sent to neurosurgeon patient requested

## 2023-02-19 NOTE — Assessment & Plan Note (Signed)
Patient BP  Vitals:   02/09/23 0804 02/09/23 0847  BP: (!) 144/98 (!) 150/88    in the office. Advised pt to follow a low sodium and heart healthy diet. Continue losartan 50 mg daily. Will continue to monitor.

## 2023-02-22 ENCOUNTER — Telehealth: Payer: Self-pay

## 2023-02-22 NOTE — Telephone Encounter (Signed)
PA initiated via Covermymeds; KEY: [id number].  This drug/product is not covered under the pharmacy benefit. Prior Authorization is not available.

## 2023-02-22 NOTE — Telephone Encounter (Signed)
LMTCB

## 2023-02-22 NOTE — Telephone Encounter (Signed)
Please call the patient and let her know that Reginal Lutes  is not covered under the pharmacy benefit.  Advise to check with the insurance which medication is covered for weight loss and let us know.

## 2023-02-23 MED ORDER — WEGOVY 0.25 MG/0.5ML ~~LOC~~ SOAJ: 0.2500 mg | SUBCUTANEOUS | 1 refills | Status: DC

## 2023-02-23 NOTE — Telephone Encounter (Signed)
Spoke with pt to advise of message below and she stated that she can get the [facility] for free at [facility]. Pt asked that we send the rx back in. Spoke with [name], NP for approval to send in. Rx has been sent and pt is aware.

## 2023-02-23 NOTE — Addendum Note (Signed)
Addended by: Sandy Salaam on: 02/23/2023 05:03 PM   Modules accepted: Orders

## 2023-02-27 NOTE — Telephone Encounter (Signed)
Pt called stating she was returning a call from [place]

## 2023-02-28 ENCOUNTER — Ambulatory Visit
Admission: RE | Admit: 2023-02-28 | Discharge: 2023-02-28 | Disposition: A | Discharge status: Home / Self Care | Payer: Commercial Managed Care - PPO | Source: Ambulatory Visit | Attending: Nurse Practitioner | Admitting: Nurse Practitioner

## 2023-02-28 DIAGNOSIS — Z1231 Encounter for screening mammogram for malignant neoplasm of breast: Secondary | ICD-10-CM

## 2023-02-28 NOTE — Telephone Encounter (Signed)
Spoke with patient and she has not been able to get the [employer] will not cover. Advised she could call insurance and ask for medication that would be covered. Patient thanked [place] and hung up phone.

## 2023-03-07 ENCOUNTER — Other Ambulatory Visit: Payer: Self-pay | Admitting: Nurse Practitioner

## 2023-03-07 DIAGNOSIS — R928 Other abnormal and inconclusive findings on diagnostic imaging of breast: Secondary | ICD-10-CM

## 2023-03-08 ENCOUNTER — Ambulatory Visit: Payer: Commercial Managed Care - PPO

## 2023-03-08 ENCOUNTER — Ambulatory Visit
Admission: RE | Admit: 2023-03-08 | Discharge: 2023-03-08 | Disposition: A | Discharge status: Home / Self Care | Payer: Commercial Managed Care - PPO | Source: Ambulatory Visit | Attending: Nurse Practitioner | Admitting: Nurse Practitioner

## 2023-03-08 ENCOUNTER — Other Ambulatory Visit: Payer: Self-pay | Admitting: Nurse Practitioner

## 2023-03-08 DIAGNOSIS — H16002 Unspecified corneal ulcer, left eye: Secondary | ICD-10-CM | POA: Diagnosis not present

## 2023-03-08 DIAGNOSIS — R928 Other abnormal and inconclusive findings on diagnostic imaging of breast: Secondary | ICD-10-CM | POA: Diagnosis not present

## 2023-03-08 DIAGNOSIS — N6489 Other specified disorders of breast: Secondary | ICD-10-CM

## 2023-03-09 ENCOUNTER — Encounter: Payer: Self-pay | Admitting: Emergency Medicine

## 2023-03-09 ENCOUNTER — Other Ambulatory Visit: Payer: Self-pay

## 2023-03-09 ENCOUNTER — Emergency Department: Payer: Commercial Managed Care - PPO

## 2023-03-09 ENCOUNTER — Other Ambulatory Visit
Admission: RE | Admit: 2023-03-09 | Discharge: 2023-03-09 | Disposition: A | Discharge status: Home / Self Care | Payer: Commercial Managed Care - PPO | Source: Ambulatory Visit | Attending: Ophthalmology | Admitting: Ophthalmology

## 2023-03-09 ENCOUNTER — Emergency Department
Admission: EM | Admit: 2023-03-09 | Discharge: 2023-03-09 | Disposition: A | Discharge status: Home / Self Care | Payer: Commercial Managed Care - PPO | Attending: Emergency Medicine | Admitting: Emergency Medicine

## 2023-03-09 DIAGNOSIS — E039 Hypothyroidism, unspecified: Secondary | ICD-10-CM | POA: Insufficient documentation

## 2023-03-09 DIAGNOSIS — I10 Essential (primary) hypertension: Secondary | ICD-10-CM | POA: Diagnosis not present

## 2023-03-09 DIAGNOSIS — H5711 Ocular pain, right eye: Secondary | ICD-10-CM

## 2023-03-09 DIAGNOSIS — R519 Headache, unspecified: Secondary | ICD-10-CM | POA: Diagnosis not present

## 2023-03-09 DIAGNOSIS — H16001 Unspecified corneal ulcer, right eye: Secondary | ICD-10-CM | POA: Diagnosis not present

## 2023-03-09 LAB — BASIC METABOLIC PANEL
Anion gap: 9 (ref 5–15)
BUN: 13 mg/dL (ref 6–20)
CO2: 26 mmol/L (ref 22–32)
Calcium: 7.8 mg/dL — ABNORMAL LOW (ref 8.9–10.3)
Chloride: 104 mmol/L (ref 98–111)
Creatinine, Ser: 0.61 mg/dL (ref 0.44–1.00)
GFR, Estimated: >60 mL/min (ref >60)
Glucose, Bld: 95 mg/dL (ref 70–99)
Potassium: 3.8 mmol/L (ref 3.5–5.1)
Sodium: 139 mmol/L (ref 135–145)

## 2023-03-09 LAB — CBC WITH DIFFERENTIAL/PLATELET
Abs Immature Granulocytes: 0.02 10*3/uL (ref 0.00–0.07)
Basophils Absolute: 0 10*3/uL (ref 0.0–0.1)
Basophils Relative: 0 %
Eosinophils Absolute: 0.1 10*3/uL (ref 0.0–0.5)
Eosinophils Relative: 2 %
HCT: 40.6 % (ref 36.0–46.0)
Hemoglobin: 13.5 g/dL (ref 12.0–15.0)
Immature Granulocytes: 0 %
Lymphocytes Relative: 25 %
Lymphs Abs: 2.1 10*3/uL (ref 0.7–4.0)
MCH: 31.5 pg (ref 26.0–34.0)
MCHC: 33.3 g/dL (ref 30.0–36.0)
MCV: 94.9 fL (ref 80.0–100.0)
Monocytes Absolute: 0.6 10*3/uL (ref 0.1–1.0)
Monocytes Relative: 7 %
Neutro Abs: 5.6 10*3/uL (ref 1.7–7.7)
Neutrophils Relative %: 66 %
Platelets: 273 10*3/uL (ref 150–400)
RBC: 4.28 MIL/uL (ref 3.87–5.11)
RDW: 12.7 % (ref 11.5–15.5)
WBC: 8.4 10*3/uL (ref 4.0–10.5)
nRBC: 0 % (ref 0.0–0.2)

## 2023-03-09 LAB — AEROBIC CULTURE W GRAM STAIN (SUPERFICIAL SPECIMEN): Gram Stain: NONE SEEN

## 2023-03-09 MED ORDER — PANTOPRAZOLE SODIUM 40 MG PO TBEC
40.0000 mg | DELAYED_RELEASE_TABLET | Freq: Every day | ORAL | 0 refills | Status: DC
  Filled 2023-03-09: qty 30, 30d supply, fill #0

## 2023-03-09 MED ORDER — BUTALBITAL-APAP-CAFFEINE 50-325-40 MG PO TABS
1.0000 | ORAL_TABLET | Freq: Four times a day (QID) | ORAL | 0 refills | Status: DC | PRN
  Filled 2023-03-09: qty 12, 3d supply, fill #0

## 2023-03-09 MED ORDER — FLUORESCEIN SODIUM 1 MG OP STRP
1.0000 | ORAL_STRIP | Freq: Once | OPHTHALMIC | Status: AC
  Administered 2023-03-09: 1 via OPHTHALMIC
  Filled 2023-03-09: qty 1

## 2023-03-09 MED ORDER — SODIUM CHLORIDE 0.9 % IV BOLUS
1000.0000 mL | Freq: Once | INTRAVENOUS | Status: AC
  Administered 2023-03-09: 1000 mL via INTRAVENOUS

## 2023-03-09 MED ORDER — TETRACAINE HCL 0.5 % OP SOLN
2.0000 [drp] | Freq: Once | OPHTHALMIC | Status: AC
  Administered 2023-03-09: 2 [drp] via OPHTHALMIC
  Filled 2023-03-09: qty 4

## 2023-03-09 MED ORDER — ONDANSETRON HCL 4 MG/2ML IJ SOLN
4.0000 mg | Freq: Once | INTRAMUSCULAR | Status: AC
  Administered 2023-03-09: 4 mg via INTRAVENOUS
  Filled 2023-03-09: qty 2

## 2023-03-09 MED ORDER — FENTANYL CITRATE PF 50 MCG/ML IJ SOSY
50.0000 ug | PREFILLED_SYRINGE | Freq: Once | INTRAMUSCULAR | Status: AC
  Administered 2023-03-09: 50 ug via INTRAVENOUS
  Filled 2023-03-09: qty 1

## 2023-03-09 NOTE — ED Notes (Signed)
Visual Acuity    L 20/25  R 20/30  Both 20/30

## 2023-03-09 NOTE — ED Triage Notes (Addendum)
Patient ambulatory to triage with steady gait, without difficulty or distress noted; pt reports pain behind rt eye since yesterday morning; seen by ophthalmologist and rx moxifloxacin for ulcer; c/o unrelieved pain

## 2023-03-09 NOTE — ED Notes (Signed)
See triage note  Presents with pain to right eye and right sided headache Denies any trauma or fever  States she is having some nausea

## 2023-03-09 NOTE — ED Notes (Signed)
Pt placed in room 53, pt laying on bed with lights dimmed, ice pack to R eye, informed pt of possible delay in provider seeing her due to upcoming change of shift, pt verbalized understanding and is aware it may be a wait until a provider is able to see her.

## 2023-03-09 NOTE — ED Notes (Signed)
Eye acuity Left- 20/50 Right-20/100

## 2023-03-09 NOTE — ED Provider Notes (Signed)
[facility] Provider Note    Event Date/Time   First MD Initiated Contact with Patient 03/09/23 0703     (approximate)   History   Eye Pain   HPI  Hannah Ballard is a 42 y.o. female  with history of hypertension, hypothyroidism and as listed in EMR presents to the emergency department for evaluation of pain behind right eye and headache since yesterday. She states her eye started watering then pain started. She was evaluated by ophthalmology in [place] and started Moxifloxacin for "corneal ulcer." Pain behind the eye has progressively worsened. Unable to tolerate light. Current headache is not similar to any she has had in the past.      Physical Exam   Triage Vital Signs: ED Triage Vitals  Enc Vitals Group     BP 03/09/23 0510 (!) 161/111     Pulse Rate 03/09/23 0510 (!) 102     Resp 03/09/23 0510 20     Temp 03/09/23 0510 98.5 F (36.9 C)     Temp Source 03/09/23 0510 Oral     SpO2 03/09/23 0510 100 %     Weight 03/09/23 0500 200 lb (90.7 kg)     Height 03/09/23 0500 5\' 9"  (1.753 m)     Head Circumference --      Peak Flow --      Pain Score 03/09/23 0500 8     Pain Loc --      Pain Edu? --      Excl. in GC? --     Most recent vital signs: Vitals:   03/09/23 0724 03/09/23 1025  BP: (!) 160/105 (!) 158/99  Pulse: 88 80  Resp: 13 16  Temp: 97.8 F (36.6 C) 98 F (36.7 C)  SpO2: 100% 100%    General: Awake, no distress.  CV:  Good peripheral perfusion.  Resp:  Normal effort.  Abd:  No distention.  Other:  Right eye erythematous; ulceration at about 6 o'clock position. No pain on EOM.   ED Results / Procedures / Treatments   Labs (all labs ordered are listed, but only abnormal results are displayed) Labs Reviewed  BASIC METABOLIC PANEL - Abnormal; Notable for the following components:      Result Value   Calcium 7.8 (*)    All other components within normal limits  CBC WITH DIFFERENTIAL/PLATELET     EKG  Not  indicated.   RADIOLOGY  Image and radiology report reviewed and interpreted by me. Radiology report consistent with the same.  CT head negative for acute findings.  PROCEDURES:  Critical Care performed: No  Procedures     MEDICATIONS ORDERED IN ED:  Medications  tetracaine (PONTOCAINE) 0.5 % ophthalmic solution 2 drop (2 drops Right Eye Given 03/09/23 0744)  ondansetron (ZOFRAN) injection 4 mg (4 mg Intravenous Given 03/09/23 0744)  fentaNYL (SUBLIMAZE) injection 50 mcg (50 mcg Intravenous Given 03/09/23 0743)  sodium chloride 0.9 % bolus 1,000 mL (0 mLs Intravenous Stopped 03/09/23 0946)  fentaNYL (SUBLIMAZE) injection 50 mcg (50 mcg Intravenous Given 03/09/23 0942)  fluorescein ophthalmic strip 1 strip (1 strip Right Eye Given 03/09/23 0946)     IMPRESSION / MDM / ASSESSMENT AND PLAN / ED COURSE   I have reviewed the triage note.  Differential diagnosis includes, but is not limited to, migraine, corneal ulcer, iritis  Patient's presentation is most consistent with acute presentation with potential threat to life or bodily function.  42 year old female presents to the ER  for evaluation of right eye pain, redness, and headache. See HPI. Patient does not wear contacts.   Plan will be to get labs and CT of the head due to severity of headache and pressure behind her eye.   Visual acuity 20/30 OD, 20/25 OS, 20/30 OU IOP: 14, 16  CT head and labs are reassuring. Fentanyl initially helped headache, but it has returned. Additional medications ordered.  Case discussed with ED attending, Dr. Cyril Loosen, who recommended ophthalmology consult.  Dr. Druscilla Brownie consulted. Plan will be to have patient go to his office for further workup. Address and phone number provided on discharge paperwork. Patient and husband agree to go straight to the office.  Medication sent to pharmacy for treatment of headache. She was advised not to use the moxifloxacin drops until after she is evaluated by  ophthalmology.     FINAL CLINICAL IMPRESSION(S) / ED DIAGNOSES   Final diagnoses:  Corneal ulcer of right eye  Acute right eye pain  Acute intractable headache, unspecified headache type     Rx / DC Orders   ED Discharge Orders          Ordered    butalbital-acetaminophen-caffeine (FIORICET) 50-325-40 MG tablet  Every 6 hours PRN        03/09/23 1016             Note:  This document was prepared using Dragon voice recognition software and may include unintentional dictation errors.   Chinita Pester, FNP 03/09/23 1121    Jene Every, MD 03/09/23 1151

## 2023-03-10 DIAGNOSIS — H16001 Unspecified corneal ulcer, right eye: Secondary | ICD-10-CM | POA: Diagnosis not present

## 2023-03-10 LAB — AEROBIC CULTURE W GRAM STAIN (SUPERFICIAL SPECIMEN)

## 2023-03-11 DIAGNOSIS — K219 Gastro-esophageal reflux disease without esophagitis: Secondary | ICD-10-CM | POA: Diagnosis not present

## 2023-03-11 DIAGNOSIS — E039 Hypothyroidism, unspecified: Secondary | ICD-10-CM | POA: Diagnosis not present

## 2023-03-11 DIAGNOSIS — I10 Essential (primary) hypertension: Secondary | ICD-10-CM | POA: Diagnosis not present

## 2023-03-11 DIAGNOSIS — H16001 Unspecified corneal ulcer, right eye: Secondary | ICD-10-CM | POA: Diagnosis not present

## 2023-03-11 DIAGNOSIS — Z5941 Food insecurity: Secondary | ICD-10-CM | POA: Diagnosis not present

## 2023-03-11 DIAGNOSIS — Z885 Allergy status to narcotic agent status: Secondary | ICD-10-CM | POA: Diagnosis not present

## 2023-03-11 DIAGNOSIS — Z79890 Hormone replacement therapy (postmenopausal): Secondary | ICD-10-CM | POA: Diagnosis not present

## 2023-03-11 DIAGNOSIS — R946 Abnormal results of thyroid function studies: Secondary | ICD-10-CM | POA: Diagnosis not present

## 2023-03-11 DIAGNOSIS — R7989 Other specified abnormal findings of blood chemistry: Secondary | ICD-10-CM | POA: Diagnosis not present

## 2023-03-11 DIAGNOSIS — Z9884 Bariatric surgery status: Secondary | ICD-10-CM | POA: Diagnosis not present

## 2023-03-11 DIAGNOSIS — R519 Headache, unspecified: Secondary | ICD-10-CM | POA: Diagnosis not present

## 2023-03-11 LAB — AEROBIC CULTURE W GRAM STAIN (SUPERFICIAL SPECIMEN): Culture: NO GROWTH

## 2023-03-12 ENCOUNTER — Ambulatory Visit
Admission: RE | Admit: 2023-03-12 | Discharge: 2023-03-12 | Disposition: A | Discharge status: Home / Self Care | Payer: Commercial Managed Care - PPO | Source: Ambulatory Visit | Attending: Nurse Practitioner | Admitting: Nurse Practitioner

## 2023-03-12 ENCOUNTER — Telehealth: Payer: Self-pay | Admitting: Nurse Practitioner

## 2023-03-12 ENCOUNTER — Encounter: Payer: Self-pay | Admitting: Nurse Practitioner

## 2023-03-12 ENCOUNTER — Other Ambulatory Visit: Payer: Self-pay

## 2023-03-12 ENCOUNTER — Emergency Department
Admission: EM | Admit: 2023-03-12 | Discharge: 2023-03-13 | Disposition: A | Discharge status: Home / Self Care | Payer: Commercial Managed Care - PPO | Attending: Emergency Medicine | Admitting: Emergency Medicine

## 2023-03-12 DIAGNOSIS — N6489 Other specified disorders of breast: Secondary | ICD-10-CM

## 2023-03-12 DIAGNOSIS — R928 Other abnormal and inconclusive findings on diagnostic imaging of breast: Secondary | ICD-10-CM

## 2023-03-12 DIAGNOSIS — H16001 Unspecified corneal ulcer, right eye: Secondary | ICD-10-CM | POA: Diagnosis not present

## 2023-03-12 DIAGNOSIS — E039 Hypothyroidism, unspecified: Secondary | ICD-10-CM | POA: Insufficient documentation

## 2023-03-12 DIAGNOSIS — R519 Headache, unspecified: Secondary | ICD-10-CM | POA: Insufficient documentation

## 2023-03-12 HISTORY — PX: BREAST BIOPSY: SHX20

## 2023-03-12 LAB — AEROBIC CULTURE W GRAM STAIN (SUPERFICIAL SPECIMEN)

## 2023-03-12 MED ORDER — PREDNISOLONE ACETATE 1 % OP SUSP
1.0000 [drp] | Freq: Every day | OPHTHALMIC | 0 refills | Status: DC
  Filled 2023-03-12: qty 5, 90d supply, fill #0

## 2023-03-12 MED ORDER — DIPHENHYDRAMINE HCL 50 MG/ML IJ SOLN
50.0000 mg | Freq: Once | INTRAMUSCULAR | Status: AC
  Administered 2023-03-12: 50 mg via INTRAVENOUS
  Filled 2023-03-12: qty 1

## 2023-03-12 MED ORDER — LORAZEPAM 1 MG PO TABS
1.0000 mg | ORAL_TABLET | Freq: Once | ORAL | Status: AC
  Administered 2023-03-13: 1 mg via ORAL
  Filled 2023-03-12: qty 1

## 2023-03-12 MED ORDER — FLUORESCEIN SODIUM 1 MG OP STRP: 1.0000 | ORAL_STRIP | Freq: Once | OPHTHALMIC | Status: DC

## 2023-03-12 MED ORDER — DEXAMETHASONE SODIUM PHOSPHATE 10 MG/ML IJ SOLN
10.0000 mg | Freq: Once | INTRAMUSCULAR | Status: AC
  Administered 2023-03-12: 10 mg via INTRAVENOUS
  Filled 2023-03-12: qty 1

## 2023-03-12 MED ORDER — LORAZEPAM 0.5 MG PO TABS
0.5000 mg | ORAL_TABLET | Freq: Three times a day (TID) | ORAL | 0 refills | Status: DC | PRN
  Filled 2023-03-12: qty 9, 3d supply, fill #0

## 2023-03-12 MED ORDER — HYDROMORPHONE HCL 1 MG/ML IJ SOLN
1.0000 mg | Freq: Once | INTRAMUSCULAR | Status: AC
  Administered 2023-03-12: 1 mg via INTRAVENOUS
  Filled 2023-03-12: qty 1

## 2023-03-12 MED ORDER — ONDANSETRON HCL 4 MG/2ML IJ SOLN: 4.0000 mg | Freq: Once | INTRAMUSCULAR | Status: DC

## 2023-03-12 MED ORDER — KETOROLAC TROMETHAMINE 30 MG/ML IJ SOLN
30.0000 mg | Freq: Once | INTRAMUSCULAR | Status: AC
  Administered 2023-03-12: 30 mg via INTRAVENOUS
  Filled 2023-03-12: qty 1

## 2023-03-12 MED ORDER — METOCLOPRAMIDE HCL 10 MG PO TABS
10.0000 mg | ORAL_TABLET | Freq: Three times a day (TID) | ORAL | 0 refills | Status: DC
  Filled 2023-03-12: qty 30, 10d supply, fill #0

## 2023-03-12 MED ORDER — SODIUM CHLORIDE 0.9 % IV BOLUS
500.0000 mL | Freq: Once | INTRAVENOUS | Status: AC
  Administered 2023-03-12: 500 mL via INTRAVENOUS

## 2023-03-12 MED ORDER — METOCLOPRAMIDE HCL 5 MG/ML IJ SOLN
10.0000 mg | Freq: Once | INTRAMUSCULAR | Status: AC
  Administered 2023-03-12: 10 mg via INTRAVENOUS
  Filled 2023-03-12: qty 2

## 2023-03-12 MED ORDER — ONDANSETRON HCL 4 MG/2ML IJ SOLN
4.0000 mg | Freq: Once | INTRAMUSCULAR | Status: AC
  Administered 2023-03-12: 4 mg via INTRAVENOUS
  Filled 2023-03-12: qty 2

## 2023-03-12 MED ORDER — TETRACAINE HCL 0.5 % OP SOLN: 2.0000 [drp] | Freq: Once | OPHTHALMIC | Status: DC

## 2023-03-12 NOTE — Telephone Encounter (Signed)
Done esigned

## 2023-03-12 NOTE — Telephone Encounter (Addendum)
Pt called stating the her appointment is today for her biopsy and the breast center was trying to get in touch with [place] regarding cosigning a form because she has an appointment in two hours. I told the pt that they called and I did send that to the doc of the day and they are working on it. Pt stated that she has family that have flew in for this appointment and she stated if someone can call Evelene Croon at home. I told the pt we are not able to do that and I let her know that the doc of the day is working on the order. I told her that the breast center did call us about it. I told the pt kaur does not come in until tomorrow. Pt stated so I have to wait another three to six months to see if I have breast cancer or not? The pt stated she will be up here because this is bullshit.

## 2023-03-12 NOTE — ED Notes (Signed)
See triage note  Presents with pain to right eye  States she has been seen several time for same  Was seen today at [facility]  She has had her antibiotic drops changed

## 2023-03-12 NOTE — Telephone Encounter (Signed)
Call [facility] Biopsy order is signed by Charan  Please ask [facility] if they need anything else

## 2023-03-12 NOTE — ED Triage Notes (Signed)
Pt has been having right eye pain since Friday of last week. Pt was here and given abx with steroids and than d/c to see her ophthalmologist. Pt went and saw the ophthalmologist today and since the visit pt is not getting any better and is in a lot of pain.

## 2023-03-12 NOTE — Telephone Encounter (Signed)
Hannah Ballard from the breast center called stating pt is getting a biopsy done and need orders signed. Pt provider is not in the office today

## 2023-03-12 NOTE — ED Provider Notes (Signed)
[facility] Emergency Department Provider Note     Event Date/Time   First MD Initiated Contact with Patient 03/12/23 1622     (approximate)   History   Eye Pain   HPI  Hannah Ballard is a 42 y.o. female with a history of GERD, vaping, postoperative hypothyroidism, presenting with severe headache, worst of her life which has been there for 3 days, presents to the ED for ongoing intractable headache pain.  Patient has been under the care of [facility] since last week when she presented to the ED initially for evaluation of acute right eye pain.  She is found to have a significant corneal abrasion and conjunctivitis at that time.  She apparently been followed with serial exams over the weekend by [facility].  Patient apparently presented herself to the [facility] emergency department yesterday for evaluation of persistent headache pain.  Patient remained in the ED for several hours overnight, with a headache cocktail that included Toradol IV, Reglan IV, Zofran IV, and Dilaudid IV x 3 doses.  Patient presents today, directly from the [facility] where she was evaluated by an eye doctor.  He attempted to try her on Vigamox today, and patient who initially had no pain in the right eye endorse some significant pain to the right eye after the instillation of the drops.  She is active prescriptions for oxycodone, Fioricet, Vigamox, and prednisone eyedrop at this time.  She presents today, noting some pain around the right eye as well as some light sensitivity she denies any nausea, vomiting, vision loss, weakness or paralysis.  No facial droop, tinnitus, or vertigo was reported.  She would admit that the right eye looks and feels better overall.     Physical Exam   Triage Vital Signs: ED Triage Vitals  Enc Vitals Group     BP 03/12/23 1546 (!) 156/105     Pulse Rate 03/12/23 1546 100     Resp 03/12/23 1546 19     Temp 03/12/23 1546 98.1 F (36.7 C)      Temp Source 03/12/23 1546 Oral     SpO2 03/12/23 1546 99 %     Weight 03/12/23 1547 199 lb 15.3 oz (90.7 kg)     Height 03/12/23 1547 5\' 9"  (1.753 m)     Head Circumference --      Peak Flow --      Pain Score 03/12/23 1547 10     Pain Loc --      Pain Edu? --      Excl. in GC? --     Most recent vital signs: Vitals:   03/12/23 2230 03/13/23 0000  BP: (!) 144/91 (!) 147/89  Pulse: (!) 104 (!) 104  Resp:  16  Temp:  98.1 F (36.7 C)  SpO2: 99% 98%    General Awake, no distress. NAD.  Patient talkative and engaged HEENT NCAT. PERRL. EOMI. No rhinorrhea. Mucous membranes are moist.  CV:  Good peripheral perfusion.  RESP:  Normal effort.  ABD:  No distention.    ED Results / Procedures / Treatments   Labs (all labs ordered are listed, but only abnormal results are displayed) Labs Reviewed - No data to display   EKG   RADIOLOGY   PROCEDURES:  Critical Care performed: No  Procedures   MEDICATIONS ORDERED IN ED: Medications  sodium chloride 0.9 % bolus 500 mL (0 mLs Intravenous Stopped 03/12/23 1921)  dexamethasone (DECADRON) injection 10  mg (10 mg Intravenous Given 03/12/23 1819)  metoCLOPramide (REGLAN) injection 10 mg (10 mg Intravenous Given 03/12/23 1819)  HYDROmorphone (DILAUDID) injection 1 mg (1 mg Intravenous Given 03/12/23 1857)  ketorolac (TORADOL) 30 MG/ML injection 30 mg (30 mg Intravenous Given 03/12/23 1930)  diphenhydrAMINE (BENADRYL) injection 50 mg (50 mg Intravenous Given 03/12/23 1929)  HYDROmorphone (DILAUDID) injection 1 mg (1 mg Intravenous Given 03/12/23 2230)  ondansetron (ZOFRAN) injection 4 mg (4 mg Intravenous Given 03/12/23 2231)  LORazepam (ATIVAN) tablet 1 mg (1 mg Oral Given 03/13/23 0021)     IMPRESSION / MDM / ASSESSMENT AND PLAN / ED COURSE  I reviewed the triage vital signs and the nursing notes.                              Differential diagnosis includes, but is not limited to, intracranial hemorrhage,  meningitis/encephalitis, previous head trauma, cavernous venous thrombosis, tension headache, temporal arteritis, migraine or migraine equivalent, idiopathic intracranial hypertension, and non-specific headache.   Patient's presentation is most consistent with acute complicated illness / injury requiring diagnostic workup.  Patient's diagnosis is consistent with nonspecific acute headache, not intractable.  Patient with reassuring exam workup with no acute neuromuscular deficit on exam.  Chart review revealed normal CT a and CT imaging of the brain in the last 72 hours.  Patient will be discharged home with prescriptions for Ativan and Reglan.  Patient endorses improved headache pain overall in level of 6 out of 10 at this time.  She will take her home medicines including for Fioricet along with OTC Benadryl, Aleve, Tylenol for additional headache pain relief.  Patient is to follow up with her PCP, neurology, and ophthalmology as scheduled, as needed or otherwise directed. Patient is given ED precautions to return to the ED for any worsening or new symptoms.  FINAL CLINICAL IMPRESSION(S) / ED DIAGNOSES   Final diagnoses:  Acute nonintractable headache, unspecified headache type     Rx / DC Orders   ED Discharge Orders          Ordered    LORazepam (ATIVAN) 0.5 MG tablet  3 times daily PRN        03/12/23 2347    metoCLOPramide (REGLAN) 10 MG tablet  3 times daily with meals        03/12/23 2347             Note:  This document was prepared using Dragon voice recognition software and may include unintentional dictation errors.    Lissa Hoard, PA-C 03/13/23 0042    Georga Hacking, MD 03/13/23 2227

## 2023-03-12 NOTE — Discharge Instructions (Addendum)
Your exam is normal and reassuring at this time.  Take the prescription meds as directed.  Take OTC Benadryl, Advil, your previously prescribed Fioricet along with the prescription nausea medicine and muscle relaxant as needed for headache pain relief.  Follow-up with your primary provider and ophthalmology as planned for further evaluation and management of your symptoms.

## 2023-03-12 NOTE — Progress Notes (Signed)
Referring Physician:  [name], NP [address] [place],  [place] [address]  Primary Physician:  [name], NP  History of Present Illness: 03/16/2023 Ms. Hannah Ballard has a history of HTN, hypothyroidism.   History of ACDF C3-C4 in 2008.   Seen in ED in [facility] on 03/11/23 for severe headache, corneal ulcer right eye.   She has intermittent pain in her neck that is more aching and tolerable. She has more constant tingling from her arm pit into her small finger. She was told years ago that a she had a screw backing out. She's had weakness in left arm since the surgery.   She continues with a headache behind right eye that is the "worst headache of her life." This started on Monday. She is also having blurry vision. Has not been able to sleep more than 1-2 hours at a time due to headache.   Bowel/Bladder Dysfunction: none  Conservative measures:  Physical therapy: has not participated  Multimodal medical therapy including regular antiinflammatories: tylenol  Injections: has not received epidural steroid injections  Past Surgery:  ACDF C3-C4 in 2008 by Dr Caesar Chestnut Simpson has no symptoms of cervical myelopathy.  The symptoms are causing a significant impact on the patient's life.   Review of Systems:  A 10 point review of systems is negative, except for the pertinent positives and negatives detailed in the HPI.  Past Medical History: Past Medical History:  Diagnosis Date   Cancer (HCC)    Hypertension    Migraines    Thyroid disease     Past Surgical History: Past Surgical History:  Procedure Laterality Date   ABDOMINAL HYSTERECTOMY  2018   ANKLE SURGERY Left    has hardware   ANTERIOR FUSION CERVICAL SPINE  2008   APPENDECTOMY  1997   BREAST BIOPSY     BREAST BIOPSY Right 03/12/2023   MM RT BREAST BX W LOC DEV 1ST LESION IMAGE BX SPEC STEREO GUIDE 03/12/2023 [facility]   CHOLECYSTECTOMY     FRACTURE SURGERY     SHOULDER  SURGERY Left    has pins   SPINE SURGERY      Allergies: Allergies as of 03/16/2023 - Review Complete 03/16/2023  Allergen Reaction Noted   Aspirin Swelling and Anaphylaxis 03/21/2012   Morphine Anaphylaxis and Swelling 04/20/2021    Medications: Outpatient Encounter Medications as of 03/16/2023  Medication Sig   tirzepatide (ZEPBOUND) 2.5 MG/0.5ML Pen Inject 2.5 mg into the skin once a week.   butalbital-acetaminophen-caffeine (FIORICET) 50-325-40 MG tablet Take 1 tablet by mouth every 6 (six) hours as needed for headache.   levothyroxine (SYNTHROID) 150 MCG tablet Take 1 tablet (150 mcg total) by mouth daily.   LORazepam (ATIVAN) 0.5 MG tablet Take 1 tablet (0.5 mg total) by mouth 3 (three) times daily as needed for up to 3 days for anxiety.   metoCLOPramide (REGLAN) 10 MG tablet Take 1 tablet (10 mg total) by mouth 3 (three) times daily with meals for 10 days.   pantoprazole (PROTONIX) 40 MG tablet Take 1 tablet (40 mg total) by mouth daily.   [DISCONTINUED] acetaminophen (TYLENOL) 500 MG tablet Take by mouth.   [DISCONTINUED] amitriptyline (ELAVIL) 25 MG tablet Take 1 tablet (25 mg total) by mouth at bedtime.   [DISCONTINUED] ergocalciferol (VITAMIN D2) 1.25 MG (50000 UT) capsule Take by mouth. (Patient not taking: Reported on 02/09/2023)   [DISCONTINUED] hydrOXYzine (VISTARIL) 50 MG capsule Take 1 capsule (50 mg total) by mouth  3 (three) times daily as needed.   [DISCONTINUED] losartan (COZAAR) 50 MG tablet Take 1 tablet (50 mg total) by mouth daily.   [DISCONTINUED] pantoprazole (PROTONIX) 40 MG tablet Take 1 tablet (40 mg total) by mouth daily.   [DISCONTINUED] prednisoLONE acetate (PRED FORTE) 1 % ophthalmic suspension Place 1 drop into the right eye daily. (Patient not taking: Reported on 03/16/2023)   [DISCONTINUED] Semaglutide-Weight Management (WEGOVY) 0.25 MG/0.5ML SOAJ Inject 0.25 mg into the skin once a week.   No facility-administered encounter medications on file as of  03/16/2023.    Social History: Social History   Tobacco Use   Smoking status: Former    Types: Cigarettes    Quit date: 2019    Years since quitting: 5.4   Smokeless tobacco: Never  Vaping Use   Vaping Use: Never used  Substance Use Topics   Alcohol use: Never   Drug use: Never    Family Medical History: Family History  Problem Relation Age of Onset   Healthy Mother    Healthy Father     Physical Examination: Vitals:   03/16/23 1012  BP: (!) 170/110    General: Patient is well developed, well nourished, and in no apparent distress. Attention to examination is appropriate.  She is uncomfortable during visit due to headache/pain.   Respiratory: Patient is breathing without any difficulty.   NEUROLOGICAL:     Awake, alert, oriented to person, place, and time.  Speech is clear and fluent. Fund of knowledge is appropriate.   Cervical incision is well healed.   No posterior cervical tenderness. Minimal tenderness in bilateral trapezial region.   No abnormal lesions on exposed skin.   Strength: Side Biceps Triceps Deltoid Interossei Grip Wrist Ext. Wrist Flex.  R 5 5 5 5 5 5 5   L 5 5 5 5 5 5 5    Side Iliopsoas Quads Hamstring PF DF EHL  R 5 5 5 5 5 5   L 5 5 5 5 5 5    Reflexes are 2+ and symmetric at the biceps, triceps, brachioradialis, patella and achilles.   Hoffman's is absent.  Clonus is not present.   Bilateral upper and lower extremity sensation is intact to light touch.     Gait is normal.    Medical Decision Making  Imaging: Cervical MRI dated 08/15/20:  FINDINGS: Technical note: Examination is significantly degraded by patient motion artifact. Best possible images were obtained and submitted for interpretation.   Alignment: Straightening of the cervical lordosis without static listhesis.   Vertebrae: Prior C3-4 ACDF. No evidence of fracture. No evidence of discitis. No suspicious bone lesion.   Cord: No focal cord signal abnormality  evident within the limitations of this motion degraded examination.   Posterior Fossa, vertebral arteries, paraspinal tissues: No prevertebral fluid. No paravertebral abnormality identified.   Disc levels:   C2-C3: No significant disc protrusion. No evidence of foraminal stenosis or canal stenosis.   C3-C4: No significant disc protrusion. No evidence of foraminal stenosis or canal stenosis.   C4-C5: No significant disc protrusion. No evidence of foraminal stenosis or canal stenosis.   C5-C6: No significant disc protrusion. No evidence of foraminal stenosis or canal stenosis.   C6-C7: No significant disc protrusion. No evidence of foraminal stenosis or canal stenosis.   C7-T1: No significant disc protrusion. No evidence of foraminal stenosis or canal stenosis.   IMPRESSION: 1. Examination is significantly degraded by patient motion artifact. 2. No acute osseous abnormality.  No evidence of discitis. 3. Prior  C3-4 ACDF. 4. No evidence of foraminal stenosis or canal stenosis of the cervical spine.     Electronically Signed   By: Duanne Guess D.O.   On: 08/15/2020 12:07   I have personally reviewed the images and agree with the above interpretation.  Assessment and Plan: Ms. Hannah Ballard is a pleasant 42 y.o. female has history of ACDF C3-C4 in 2008.   She has intermittent pain in her neck that is more aching and tolerable. She has more constant tingling from her arm pit into her small finger.   MRI of cervical spine from 2021 looks okay, but cannot evaluate hardware.   More concerning are her complaints of worse headache of her life with significantly elevated blood pressure. Seen in ED in [facility] on 03/11/23 for severe headache, corneal ulcer right eye.   I do not think her current headache is cervical mediated.   Treatment options discussed with patient and following plan made:   - Cervical xrays ordered. Can get when she is feeling better. Will message her with  results.  - Depending on results of cervical xrays, may consider CT of cervical spine.   - I recommend that she go to the ED now for evaluation of her headache and elevated blood pressure. She and her husband are in agreement and are headed to [facility]. ED notes from [facility] are under care everywhere and we have called to get her images pulled over to PACs from CTA head that was done on 03/11/23.   I spent a total of 30 minutes in face-to-face and non-face-to-face activities related to this patient's care today including review of outside records, review of imaging, review of symptoms, physical exam, discussion of differential diagnosis, discussion of treatment options, and documentation.   Thank you for involving me in the care of this patient.   Drake Leach PA-C Dept. of Neurosurgery

## 2023-03-13 ENCOUNTER — Other Ambulatory Visit: Payer: Self-pay

## 2023-03-13 DIAGNOSIS — R519 Headache, unspecified: Secondary | ICD-10-CM | POA: Diagnosis not present

## 2023-03-14 LAB — CULTURE, FUNGUS WITHOUT SMEAR

## 2023-03-15 ENCOUNTER — Ambulatory Visit: Payer: Commercial Managed Care - PPO | Admitting: Nurse Practitioner

## 2023-03-15 ENCOUNTER — Telehealth: Payer: Self-pay

## 2023-03-15 NOTE — Telephone Encounter (Signed)
Phone call to pt, due to missed appointment this morning.  No answer left message to call office to reschedule appointment.

## 2023-03-16 ENCOUNTER — Inpatient Hospital Stay
Admit: 2023-03-16 | Discharge: 2023-03-16 | Disposition: A | Discharge status: Home / Self Care | Payer: Self-pay | Attending: Orthopedic Surgery | Admitting: Orthopedic Surgery

## 2023-03-16 ENCOUNTER — Encounter: Payer: Self-pay | Admitting: Orthopedic Surgery

## 2023-03-16 ENCOUNTER — Inpatient Hospital Stay
Admission: EM | Admit: 2023-03-16 | Discharge: 2023-03-19 | DRG: 122 | Disposition: A | Discharge status: Home / Self Care | Payer: Commercial Managed Care - PPO | Attending: Internal Medicine | Admitting: Internal Medicine

## 2023-03-16 ENCOUNTER — Ambulatory Visit (INDEPENDENT_AMBULATORY_CARE_PROVIDER_SITE_OTHER): Payer: Commercial Managed Care - PPO | Admitting: Orthopedic Surgery

## 2023-03-16 ENCOUNTER — Telehealth: Payer: Self-pay

## 2023-03-16 ENCOUNTER — Other Ambulatory Visit: Payer: Self-pay

## 2023-03-16 VITALS — BP 170/120 | Ht 69.0 in | Wt 231.0 lb

## 2023-03-16 DIAGNOSIS — Z7985 Long-term (current) use of injectable non-insulin antidiabetic drugs: Secondary | ICD-10-CM

## 2023-03-16 DIAGNOSIS — Z049 Encounter for examination and observation for unspecified reason: Secondary | ICD-10-CM

## 2023-03-16 DIAGNOSIS — Z981 Arthrodesis status: Secondary | ICD-10-CM

## 2023-03-16 DIAGNOSIS — I160 Hypertensive urgency: Secondary | ICD-10-CM | POA: Diagnosis present

## 2023-03-16 DIAGNOSIS — E669 Obesity, unspecified: Secondary | ICD-10-CM | POA: Diagnosis present

## 2023-03-16 DIAGNOSIS — Z5986 Financial insecurity: Secondary | ICD-10-CM

## 2023-03-16 DIAGNOSIS — H16001 Unspecified corneal ulcer, right eye: Secondary | ICD-10-CM | POA: Diagnosis not present

## 2023-03-16 DIAGNOSIS — F419 Anxiety disorder, unspecified: Secondary | ICD-10-CM | POA: Diagnosis present

## 2023-03-16 DIAGNOSIS — Z885 Allergy status to narcotic agent status: Secondary | ICD-10-CM

## 2023-03-16 DIAGNOSIS — Z5982 Transportation insecurity: Secondary | ICD-10-CM

## 2023-03-16 DIAGNOSIS — Z6834 Body mass index (BMI) 34.0-34.9, adult: Secondary | ICD-10-CM

## 2023-03-16 DIAGNOSIS — G43909 Migraine, unspecified, not intractable, without status migrainosus: Secondary | ICD-10-CM | POA: Diagnosis present

## 2023-03-16 DIAGNOSIS — Z87891 Personal history of nicotine dependence: Secondary | ICD-10-CM

## 2023-03-16 DIAGNOSIS — R519 Headache, unspecified: Secondary | ICD-10-CM

## 2023-03-16 DIAGNOSIS — E039 Hypothyroidism, unspecified: Secondary | ICD-10-CM | POA: Diagnosis present

## 2023-03-16 DIAGNOSIS — M542 Cervicalgia: Secondary | ICD-10-CM

## 2023-03-16 DIAGNOSIS — Z79890 Hormone replacement therapy (postmenopausal): Secondary | ICD-10-CM

## 2023-03-16 DIAGNOSIS — H5711 Ocular pain, right eye: Secondary | ICD-10-CM | POA: Diagnosis present

## 2023-03-16 DIAGNOSIS — Z5941 Food insecurity: Secondary | ICD-10-CM

## 2023-03-16 DIAGNOSIS — R110 Nausea: Secondary | ICD-10-CM | POA: Diagnosis present

## 2023-03-16 DIAGNOSIS — H547 Unspecified visual loss: Secondary | ICD-10-CM | POA: Diagnosis present

## 2023-03-16 DIAGNOSIS — Z886 Allergy status to analgesic agent status: Secondary | ICD-10-CM

## 2023-03-16 DIAGNOSIS — I10 Essential (primary) hypertension: Secondary | ICD-10-CM | POA: Diagnosis present

## 2023-03-16 DIAGNOSIS — R000 Tachycardia, unspecified: Secondary | ICD-10-CM | POA: Diagnosis not present

## 2023-03-16 LAB — CBC WITH DIFFERENTIAL/PLATELET
Abs Immature Granulocytes: 0.02 10*3/uL (ref 0.00–0.07)
Basophils Absolute: 0 10*3/uL (ref 0.0–0.1)
Basophils Relative: 0 %
Eosinophils Absolute: 0.1 10*3/uL (ref 0.0–0.5)
Eosinophils Relative: 1 %
HCT: 44.8 % (ref 36.0–46.0)
Hemoglobin: 14.7 g/dL (ref 12.0–15.0)
Immature Granulocytes: 0 %
Lymphocytes Relative: 24 %
Lymphs Abs: 2.3 10*3/uL (ref 0.7–4.0)
MCH: 30.8 pg (ref 26.0–34.0)
MCHC: 32.8 g/dL (ref 30.0–36.0)
MCV: 93.9 fL (ref 80.0–100.0)
Monocytes Absolute: 0.5 10*3/uL (ref 0.1–1.0)
Monocytes Relative: 5 %
Neutro Abs: 6.8 10*3/uL (ref 1.7–7.7)
Neutrophils Relative %: 70 %
Platelets: 337 10*3/uL (ref 150–400)
RBC: 4.77 MIL/uL (ref 3.87–5.11)
RDW: 12.4 % (ref 11.5–15.5)
WBC: 9.8 10*3/uL (ref 4.0–10.5)
nRBC: 0 % (ref 0.0–0.2)

## 2023-03-16 LAB — BASIC METABOLIC PANEL
Anion gap: 8 (ref 5–15)
BUN: 12 mg/dL (ref 6–20)
CO2: 27 mmol/L (ref 22–32)
Calcium: 8 mg/dL — ABNORMAL LOW (ref 8.9–10.3)
Chloride: 103 mmol/L (ref 98–111)
Creatinine, Ser: 0.72 mg/dL (ref 0.44–1.00)
GFR, Estimated: >60 mL/min (ref >60)
Glucose, Bld: 101 mg/dL — ABNORMAL HIGH (ref 70–99)
Potassium: 4.2 mmol/L (ref 3.5–5.1)
Sodium: 138 mmol/L (ref 135–145)

## 2023-03-16 MED ORDER — PANTOPRAZOLE SODIUM 40 MG PO TBEC
40.0000 mg | DELAYED_RELEASE_TABLET | Freq: Every day | ORAL | Status: DC
  Administered 2023-03-18 – 2023-03-19 (×2): 40 mg via ORAL
  Filled 2023-03-16 (×2): qty 1

## 2023-03-16 MED ORDER — ONDANSETRON HCL 4 MG/2ML IJ SOLN
4.0000 mg | Freq: Four times a day (QID) | INTRAMUSCULAR | Status: DC | PRN
  Administered 2023-03-16 – 2023-03-17 (×3): 4 mg via INTRAVENOUS
  Filled 2023-03-16 (×3): qty 2

## 2023-03-16 MED ORDER — HYDROMORPHONE HCL 1 MG/ML IJ SOLN
0.5000 mg | INTRAMUSCULAR | Status: DC | PRN
  Administered 2023-03-16 – 2023-03-17 (×5): 0.5 mg via INTRAVENOUS
  Filled 2023-03-16: qty 1
  Filled 2023-03-16 (×2): qty 0.5
  Filled 2023-03-16 (×2): qty 1

## 2023-03-16 MED ORDER — ACETAMINOPHEN 325 MG PO TABS
650.0000 mg | ORAL_TABLET | Freq: Four times a day (QID) | ORAL | Status: DC | PRN
  Administered 2023-03-17: 650 mg via ORAL
  Filled 2023-03-16 (×2): qty 2

## 2023-03-16 MED ORDER — METOCLOPRAMIDE HCL 5 MG PO TABS
10.0000 mg | ORAL_TABLET | Freq: Three times a day (TID) | ORAL | Status: DC
  Administered 2023-03-16: 10 mg via ORAL
  Filled 2023-03-16: qty 1
  Filled 2023-03-16: qty 2

## 2023-03-16 MED ORDER — OXYCODONE HCL 5 MG PO TABS
5.0000 mg | ORAL_TABLET | Freq: Four times a day (QID) | ORAL | Status: DC | PRN
  Administered 2023-03-16 – 2023-03-17 (×4): 5 mg via ORAL
  Filled 2023-03-16 (×6): qty 1

## 2023-03-16 MED ORDER — SODIUM CHLORIDE 0.9 % IV SOLN
12.5000 mg | Freq: Once | INTRAVENOUS | Status: AC
  Administered 2023-03-16: 12.5 mg via INTRAVENOUS
  Filled 2023-03-16: qty 12.5

## 2023-03-16 MED ORDER — ONDANSETRON HCL 4 MG/2ML IJ SOLN
4.0000 mg | Freq: Once | INTRAMUSCULAR | Status: AC
  Administered 2023-03-16: 4 mg via INTRAVENOUS
  Filled 2023-03-16: qty 2

## 2023-03-16 MED ORDER — GATIFLOXACIN 0.5 % OP SOLN
1.0000 [drp] | Freq: Four times a day (QID) | OPHTHALMIC | Status: DC
  Filled 2023-03-16: qty 2.5

## 2023-03-16 MED ORDER — GATIFLOXACIN 0.5 % OP SOLN: 1.0000 [drp] | OPHTHALMIC | Status: DC | PRN

## 2023-03-16 MED ORDER — TETRACAINE HCL 0.5 % OP SOLN
1.0000 [drp] | Freq: Once | OPHTHALMIC | Status: DC
  Filled 2023-03-16: qty 4

## 2023-03-16 MED ORDER — ACETAMINOPHEN 650 MG RE SUPP: 650.0000 mg | Freq: Four times a day (QID) | RECTAL | Status: DC | PRN

## 2023-03-16 MED ORDER — OXYCODONE-ACETAMINOPHEN 5-325 MG PO TABS
2.0000 | ORAL_TABLET | Freq: Once | ORAL | Status: AC
  Administered 2023-03-16: 2 via ORAL
  Filled 2023-03-16: qty 2

## 2023-03-16 MED ORDER — VANCOMYCIN 25 MG/ML COMPOUNDED TOPICAL OPHTHALMIC SOLUTION: 1.0000 [drp] | Freq: Four times a day (QID) | OPHTHALMIC | Status: DC

## 2023-03-16 MED ORDER — HYDROMORPHONE HCL 1 MG/ML IJ SOLN
0.5000 mg | Freq: Once | INTRAMUSCULAR | Status: AC
  Administered 2023-03-16: 0.5 mg via INTRAVENOUS
  Filled 2023-03-16: qty 0.5

## 2023-03-16 MED ORDER — HYDROMORPHONE HCL 1 MG/ML IJ SOLN: 0.5000 mg | Freq: Once | INTRAMUSCULAR | Status: DC

## 2023-03-16 MED ORDER — BUTALBITAL-APAP-CAFFEINE 50-325-40 MG PO TABS: 1.0000 | ORAL_TABLET | Freq: Four times a day (QID) | ORAL | Status: DC | PRN

## 2023-03-16 MED ORDER — HEPARIN SODIUM (PORCINE) 5000 UNIT/ML IJ SOLN
5000.0000 [IU] | Freq: Three times a day (TID) | INTRAMUSCULAR | Status: DC
  Administered 2023-03-17 – 2023-03-19 (×6): 5000 [IU] via SUBCUTANEOUS
  Filled 2023-03-16 (×6): qty 1

## 2023-03-16 MED ORDER — SODIUM CHLORIDE 0.9 % IV BOLUS
1000.0000 mL | Freq: Once | INTRAVENOUS | Status: AC
  Administered 2023-03-16: 1000 mL via INTRAVENOUS

## 2023-03-16 MED ORDER — CYCLOPENTOLATE HCL 1 % OP SOLN
1.0000 [drp] | Freq: Three times a day (TID) | OPHTHALMIC | Status: DC
  Administered 2023-03-16 – 2023-03-19 (×9): 1 [drp] via OPHTHALMIC
  Filled 2023-03-16: qty 0.1

## 2023-03-16 MED ORDER — ONDANSETRON HCL 4 MG PO TABS
4.0000 mg | ORAL_TABLET | Freq: Four times a day (QID) | ORAL | Status: DC | PRN
  Administered 2023-03-16 – 2023-03-18 (×4): 4 mg via ORAL
  Filled 2023-03-16 (×6): qty 1

## 2023-03-16 MED ORDER — LORAZEPAM 0.5 MG PO TABS
0.5000 mg | ORAL_TABLET | Freq: Three times a day (TID) | ORAL | Status: DC | PRN
  Administered 2023-03-17 – 2023-03-18 (×2): 0.5 mg via ORAL
  Filled 2023-03-16 (×2): qty 1

## 2023-03-16 MED ORDER — LEVOTHYROXINE SODIUM 50 MCG PO TABS
150.0000 ug | ORAL_TABLET | Freq: Every day | ORAL | Status: DC
  Administered 2023-03-17 – 2023-03-19 (×3): 150 ug via ORAL
  Filled 2023-03-16 (×3): qty 3

## 2023-03-16 MED ORDER — GATIFLOXACIN 0.5 % OP SOLN
1.0000 [drp] | OPHTHALMIC | Status: DC
  Administered 2023-03-16 – 2023-03-19 (×47): 1 [drp] via OPHTHALMIC
  Filled 2023-03-16: qty 2.5

## 2023-03-16 MED ORDER — VANCOMYCIN 25 MG/ML COMPOUNDED TOPICAL OPHTHALMIC SOLUTION
2.0000 [drp] | OPHTHALMIC | Status: DC
  Filled 2023-03-16 (×2): qty 10

## 2023-03-16 NOTE — ED Provider Notes (Signed)
[facility] Provider Note    Event Date/Time   First MD Initiated Contact with Patient 03/16/23 1217     (approximate)   History   Migraine   HPI  Hannah Ballard is a 42 y.o. female presents to the emergency department with right eye pain and headache.  Patient was diagnosed with a corneal ulcer in the right eye on Monday by ophthalmology and started on multiple different antibiotic, steroid and cycloplegic eyedrops.  Patient is supposed to be taking the eyedrops every hour.  Follow-up was scheduled in the clinic on Wednesday however patient had come to the emergency department for severe headache and had pain control at that time.  States that she missed the follow-up appointment and then "her phone was dead for multiple days so she did not know to go back to clinic".  States that she has been using the antibiotic and steroid drops but have not been using the "muscle relaxer drops".  States that she accidentally left them at the ophthalmologist office.  Does state that she took her oxycodone.  Denies wearing any contacts.  Decreased vision in the right eye.  States that the light sensitive.  Denies falls or trauma.     Physical Exam   Triage Vital Signs: ED Triage Vitals  Enc Vitals Group     BP 03/16/23 1120 (!) 147/102     Pulse Rate 03/16/23 1120 (!) 103     Resp 03/16/23 1120 18     Temp 03/16/23 1120 99.1 F (37.3 C)     Temp Source 03/16/23 1120 Oral     SpO2 03/16/23 1120 98 %     Weight --      Height --      Head Circumference --      Peak Flow --      Pain Score 03/16/23 1119 9     Pain Loc --      Pain Edu? --      Excl. in GC? --     Most recent vital signs: Vitals:   03/16/23 1120  BP: (!) 147/102  Pulse: (!) 103  Resp: 18  Temp: 99.1 F (37.3 C)  SpO2: 98%    Physical Exam Constitutional:      Appearance: She is well-developed.  Eyes:     Conjunctiva/sclera: Conjunctivae normal.     Comments: Right eye 4 mm and minimally  reactive.  Extraocular movements intact but does have some pain with extraocular movements.  No preseptal cellulitis or swelling.  Direct and consensual photophobia.  Cranial nerves intact  Cardiovascular:     Rate and Rhythm: Regular rhythm.  Pulmonary:     Effort: No respiratory distress.  Abdominal:     General: There is no distension.  Musculoskeletal:        General: Normal range of motion.     Cervical back: Normal range of motion.  Skin:    General: Skin is warm.  Neurological:     Mental Status: She is alert. Mental status is at baseline.     IMPRESSION / MDM / ASSESSMENT AND PLAN / ED COURSE  I reviewed the triage vital signs and the nursing notes.  Differential diagnosis including corneal ulcer, uveitis.  Called and discussed the case with ophthalmology Dr. Druscilla Brownie, did not recommend any further imaging at this time.  Recommended admission for pain control.  Recommended Vigamox 1 drop every 1 hour, vancomycin 50 mg/milliliters 1 drop every hour, Cyclogyl 1 or 2%  3 times daily  Patient was given multiple doses of IV pain medication.  Given a Percocet.  LABS (all labs ordered are listed, but only abnormal results are displayed) Labs interpreted as -    Labs Reviewed  BASIC METABOLIC PANEL - Abnormal; Notable for the following components:      Result Value   Glucose, Bld 101 (*)    Calcium 8.0 (*)    All other components within normal limits  CBC WITH DIFFERENTIAL/PLATELET     MDM    Consulted hospitalist for admission for pain control for corneal ulcer with plans for ophthalmology evaluation.   PROCEDURES:  Critical Care performed: No  Procedures  Patient's presentation is most consistent with acute presentation with potential threat to life or bodily function.   MEDICATIONS ORDERED IN ED: Medications  tetracaine (PONTOCAINE) 0.5 % ophthalmic solution 1 drop (1 drop Right Eye Not Given 03/16/23 1403)  HYDROmorphone (DILAUDID) injection 0.5 mg (0.5 mg  Intravenous Not Given 03/16/23 1408)  HYDROmorphone (DILAUDID) injection 0.5 mg (0.5 mg Intravenous Given 03/16/23 1312)  ondansetron (ZOFRAN) injection 4 mg (4 mg Intravenous Given 03/16/23 1312)  sodium chloride 0.9 % bolus 1,000 mL (0 mLs Intravenous Stopped 03/16/23 1402)  oxyCODONE-acetaminophen (PERCOCET/ROXICET) 5-325 MG per tablet 2 tablet (2 tablets Oral Given 03/16/23 1412)    FINAL CLINICAL IMPRESSION(S) / ED DIAGNOSES   Final diagnoses:  Corneal ulcer of right eye     Rx / DC Orders   ED Discharge Orders     None        Note:  This document was prepared using Dragon voice recognition software and may include unintentional dictation errors.   Corena Herter, MD 03/16/23 1527

## 2023-03-16 NOTE — H&P (Addendum)
History and Physical:    Hannah Ballard   ZHY:[id number] DOB: 10-11-1980 DOA: 03/16/2023  Referring MD/provider: Corena Herter, MD PCP: [name], NP   Patient coming from: Home  Chief Complaint: Right eye pain  History of Present Illness:   Hannah Ballard is a 42 y.o. female with medical history significant for hypertension (off of antihypertensives for 8 months) migraine headache, hypothyroidism, anxiety, depression, obesity, recently diagnosed with right corneal ulcer.  She presented to the hospital because of severe pain in the right eye.  Pain was about 9/10 in severity when she got to the ED.  Pain is worse with eye movements.  No known relieving factors.  It is associated with right retro-orbital pain.  She has been seeing Dr. Druscilla Brownie, ophthalmologist, since 03/08/2023.  She had been going to on a daily basis.  However, she missed the appointment on 03/14/2023 and has not seen him since that time.  She had been prescribed moxifloxacin eyedrops, steroid eyedrop and another eyedrop which is a muscle relaxant.  However, she said that she had misplaced the muscle relaxant eyedrop at the doctor's office on 03/12/2023 and never got to use it.   ED Course:  The patient was given IV fluids, tetracaine eyedrops, IV Dilaudid and Percocet .  ROS:   ROS: See as reviewed were negative  Past Medical History:   Past Medical History:  Diagnosis Date   Cancer (HCC)    Hypertension    Migraines    Thyroid disease     Past Surgical History:   Past Surgical History:  Procedure Laterality Date   ABDOMINAL HYSTERECTOMY  2018   ANKLE SURGERY Left    has hardware   ANTERIOR FUSION CERVICAL SPINE  2008   APPENDECTOMY  1997   BREAST BIOPSY     BREAST BIOPSY Right 03/12/2023   MM RT BREAST BX W LOC DEV 1ST LESION IMAGE BX SPEC STEREO GUIDE 03/12/2023 [facility]   CHOLECYSTECTOMY     FRACTURE SURGERY     SHOULDER SURGERY Left    has pins   SPINE SURGERY      Social History:    Social History   Socioeconomic History   Marital status: Married    Spouse name: Not on file   Number of children: Not on file   Years of education: Not on file   Highest education level: Associate degree: occupational, Scientist, product/process development, or vocational program  Occupational History   Not on file  Tobacco Use   Smoking status: Former    Types: Cigarettes    Quit date: 2019    Years since quitting: 5.4   Smokeless tobacco: Never  Vaping Use   Vaping Use: Never used  Substance and Sexual Activity   Alcohol use: Never   Drug use: Never   Sexual activity: Yes  Other Topics Concern   Not on file  Social History Narrative   Not on file   Social Determinants of Health   Financial Resource Strain: High Risk (02/07/2023)   Overall Financial Resource Strain (CARDIA)    Difficulty of Paying Living Expenses: Hard  Food Insecurity: Food Insecurity Present (02/07/2023)   Hunger Vital Sign    Worried About Running Out of Food in the Last Year: Sometimes true    Ran Out of Food in the Last Year: Sometimes true  Transportation Needs: Unmet Transportation Needs (02/07/2023)   PRAPARE - Administrator, Civil Service (Medical): Yes    Lack of Transportation (Non-Medical): Yes  Physical Activity: Sufficiently Active (02/07/2023)   Exercise Vital Sign    Days of Exercise per Week: 5 days    Minutes of Exercise per Session: 60 min  Stress: Stress Concern Present (02/07/2023)   [employer] of Occupational Health - Occupational Stress Questionnaire    Feeling of Stress : Very much  Social Connections: Socially Integrated (02/07/2023)   Social Connection and Isolation Panel [NHANES]    Frequency of Communication with Friends and Family: Three times a week    Frequency of Social Gatherings with Friends and Family: Once a week    Attends Religious Services: More than 4 times per year    Active Member of [employer] or Organizations: Yes    Attends Engineer, structural: More than 4  times per year    Marital Status: Married  Catering manager Violence: Not on file    Allergies   Aspirin and Morphine  Family history:   Family History  Problem Relation Age of Onset   Healthy Mother    Healthy Father     Current Medications:   Prior to Admission medications   Medication Sig Start Date End Date Taking? Authorizing Provider  butalbital-acetaminophen-caffeine (FIORICET) 50-325-40 MG tablet Take 1 tablet by mouth every 6 (six) hours as needed for headache. 03/09/23 03/08/24  Triplett, Rulon Eisenmenger B, FNP  levothyroxine (SYNTHROID) 150 MCG tablet Take 1 tablet (150 mcg total) by mouth daily. 02/12/23   [name], NP  LORazepam (ATIVAN) 0.5 MG tablet Take 1 tablet (0.5 mg total) by mouth 3 (three) times daily as needed for up to 3 days for anxiety. 03/12/23 03/16/23  Menshew, Charlesetta Ivory, PA-C  metoCLOPramide (REGLAN) 10 MG tablet Take 1 tablet (10 mg total) by mouth 3 (three) times daily with meals for 10 days. 03/12/23 03/23/23  Menshew, Charlesetta Ivory, PA-C  pantoprazole (PROTONIX) 40 MG tablet Take 1 tablet (40 mg total) by mouth daily. 02/08/23   [name], NP  tirzepatide (ZEPBOUND) 2.5 MG/0.5ML Pen Inject 2.5 mg into the skin once a week.    [provider]    Physical Exam:   Vitals:   03/16/23 1120  BP: (!) 147/102  Pulse: (!) 103  Resp: 18  Temp: 99.1 F (37.3 C)  TempSrc: Oral  SpO2: 98%     Physical Exam: Blood pressure (!) 147/102, pulse (!) 103, temperature 99.1 F (37.3 C), temperature source Oral, resp. rate 18, SpO2 98 %. Gen: No acute distress. Head: Normocephalic, atraumatic. Eyes: Right pupil is dilated and minimally reactive.  Extraocular movements intact.  Sclerae nonicteric.  Mouth: Moist mucous membranes Neck: Supple, no thyromegaly, no lymphadenopathy, no jugular venous distention. Chest: Lungs are clear to auscultation with good air movement. No rales, rhonchi or wheezes.  CV: Heart sounds are regular with an S1,  S2. No murmurs, rubs or gallops.  Abdomen: Soft, nontender, obese with normal active bowel sounds. No palpable masses. Extremities: Extremities are without clubbing, or cyanosis. No edema. Pedal pulses 2+.  Skin: Warm and dry. No rashes, lesions or wounds Neuro: Alert and oriented times 3; grossly nonfocal.  Psych: Insight is good and judgment is appropriate. Mood and affect normal.   Data Review:    Labs: Basic Metabolic Panel: Recent Labs  Lab 03/16/23 1305  NA 138  K 4.2  CL 103  CO2 27  GLUCOSE 101*  BUN 12  CREATININE 0.72  CALCIUM 8.0*   Liver Function Tests: No results for input(s): "AST", "ALT", "ALKPHOS", "BILITOT", "PROT", "ALBUMIN" in  the last 168 hours. No results for input(s): "LIPASE", "AMYLASE" in the last 168 hours. No results for input(s): "AMMONIA" in the last 168 hours. CBC: Recent Labs  Lab 03/16/23 1221  WBC 9.8  NEUTROABS 6.8  HGB 14.7  HCT 44.8  MCV 93.9  PLT 337   Cardiac Enzymes: No results for input(s): "CKTOTAL", "CKMB", "CKMBINDEX", "TROPONINI" in the last 168 hours.  BNP (last 3 results) No results for input(s): "PROBNP" in the last 8760 hours. CBG: No results for input(s): "GLUCAP" in the last 168 hours.  Urinalysis No results found for: "COLORURINE", "APPEARANCEUR", "LABSPEC", "PHURINE", "GLUCOSEU", "HGBUR", "BILIRUBINUR", "KETONESUR", "PROTEINUR", "UROBILINOGEN", "NITRITE", "LEUKOCYTESUR"    Radiographic Studies: No results found.  EKG: Independently reviewed by me showed sinus tachycardia.    Assessment/Plan:   Principal Problem:   Acute right eye pain Active Problems:   Corneal ulcer of right eye   Acute right eye pain, right corneal ulcer: Admit to MedSurg for observation.  IV Dilaudid, acetaminophen and oxycodone as needed for pain.  Dr. Druscilla Brownie, ophthalmologist, as already been consulted by Dr. Arnoldo Morale, ED physician.  He recommended vancomycin eyedrops every hour, Vigamox eyedrop every hour and Cyclogyl 1 to 2% 3  times daily.  Dr. Druscilla Brownie will likely see patient later today.  Case was personally discussed with Dr. Druscilla Brownie.  He confirmed frequency of hydroxyzine to be given.  Case was also discussed with Dr Arnoldo Morale, ED physician.   Hypertensive urgency, history of hypertension: Patient said she has been off of antihypertensives for 8 months because her blood pressure has been okay.  Suspect her headache and right eye pain are contributing to elevated blood pressure.  Monitor BP closely.   Obesity (BMI 34.1): She says she started tirzepatide injection for weight loss (about 3 weeks prior to admission)   She saw the neurosurgeon at the office on 03/16/2023 because of neck pain/headache and history of anterior cervical spine fusion.   Other comorbidities include migraine headache, hypothyroidism, anxiety   Other information:   DVT prophylaxis: SCD  Code Status: Full code. Family Communication: None  Disposition Plan: Plan to discharge home in 1 to 2 days Consults called: Ophthalmologist Admission status: Observation   The medical decision making is of moderate complexity, therefore this is a level 2 visit.    Faron Whitelock [facility] Pager: Please check [email or web address]   How to contact the [facility] Attending or Consulting provider 7A - 7P or covering provider during after hours 7P -7A, for this patient?   Check the care team in [facility] and look for a) attending/consulting TRH provider listed and b) the [facility] team listed Log into [email or web address] and use [place]'s universal password to access. If you do not have the password, please contact the hospital operator. Locate the [facility] provider you are looking for under [facility] and page to a number that you can be directly reached. If you still have difficulty reaching the provider, please page the [facility] (Director on Call) for the Hospitalists listed on amion for assistance.  03/16/2023, 3:58 PM

## 2023-03-16 NOTE — Transitions of Care (Post Inpatient/ED Visit) (Signed)
I spoke with pt; pt was seen [facility] ED on 03/13/23 with H/A; I spoke with pt and she did not have time to talk she said she was on her way to neurosurgeon appt and would call PCP if needed and pt has contact info for [facility]. Sending note to Vallery Ridge NP.    03/16/2023  Name: Hannah Ballard MRN: [id number] DOB: 1981-09-22  Today's TOC FU Call Status: Today's TOC FU Call Status:: Successful TOC FU Call Competed TOC FU Call Complete Date: 03/16/23  Transition Care Management Follow-up Telephone Call Date of Discharge: 03/13/23 Discharge Facility: [facility]) Type of Discharge: Emergency Department Reason for ED Visit: Other: (pt was seen [facility] ED on 03/13/23 with H/A; I spoke with pt and she did not have time to talk she said she was on her way to neurosurgeon appt and would call PCP if needed and pt has contact info for [facility].)  Items Reviewed:    Medications Reviewed Today: Medications Reviewed Today     Reviewed by Lucas Mallow, RN (Registered Nurse) on 03/12/23 at 1633  Med List Status: <None>   Medication Order Taking? Sig Documenting Provider Last Dose Status Informant  acetaminophen (TYLENOL) 500 MG tablet [id number]  Take by mouth. [provider]  Active   amitriptyline (ELAVIL) 25 MG tablet [id number]  Take 1 tablet (25 mg total) by mouth at bedtime. [name], NP  Active   butalbital-acetaminophen-caffeine (FIORICET) 50-325-40 MG tablet [id number]  Take 1 tablet by mouth every 6 (six) hours as needed for headache. Triplett, Cari B, FNP  Active   ergocalciferol (VITAMIN D2) 1.25 MG (50000 UT) capsule [id number]  Take by mouth.  Patient not taking: Reported on 02/09/2023   [provider]  Active   hydrOXYzine (VISTARIL) 50 MG capsule [id number]  Take 1 capsule (50 mg total) by mouth 3 (three) times daily as needed. [name], NP  Active   levothyroxine (SYNTHROID) 150 MCG tablet [id number]  Take 1 tablet (150 mcg  total) by mouth daily. [name], NP  Active   losartan (COZAAR) 50 MG tablet [id number]  Take 1 tablet (50 mg total) by mouth daily. [name], NP  Active   pantoprazole (PROTONIX) 40 MG tablet [id number]  Take 1 tablet (40 mg total) by mouth daily. [name], NP  Active   pantoprazole (PROTONIX) 40 MG tablet [id number]  Take 1 tablet (40 mg total) by mouth daily. [name], NP  Active   prednisoLONE acetate (PRED FORTE) 1 % ophthalmic suspension [id number]  Place 1 drop into the right eye daily.   Active   Semaglutide-Weight Management (WEGOVY) 0.25 MG/0.5ML SOAJ [id number]  Inject 0.25 mg into the skin once a week. [name], NP  Active             Home Care and Equipment/Supplies:    Functional Questionnaire:    Follow up appointments reviewed: PCP Follow-up appointment confirmed?: NA Specialist Hospital Follow-up appointment confirmed?: Yes Date of Specialist follow-up appointment?: 03/16/23 Follow-Up Specialty Provider:: neurosurgeon Do you understand care options if your condition(s) worsen?: Yes-patient verbalized understanding    SIGNATURE Lewanda Rife, LPN

## 2023-03-16 NOTE — ED Notes (Signed)
Pt is hard stick and requests lab draw when IV is placed.

## 2023-03-16 NOTE — Patient Instructions (Signed)
It was so nice to see you today. Thank you so much for coming in.    Your neck MRI from 2021 looked okay. I want to get an updated xray of your neck to look into things further.   You can get the xrays at [facility] (building with the white pillars) off of [address]. The address is [address] [place], [place] [address]. You do not need any appointment. You can do this once you are feeling better and your headache is gone.   I am worried about your persistent headache and your elevated blood pressure. I recommend that you go to the emergency room for further evaluation. I do not think that your headache is coming from your neck.   Once you get the xrays done, it will take 5-7 days for me to get the results. I will message you about the results and with a further plan.   Please do not hesitate to call if you have any questions or concerns. You can also message me in MyChart.   I hope your headache gets better quickly. I am so sorry that you are feeling so bad. Let me know if you need anything else.   Drake Leach PA-C [phone]

## 2023-03-16 NOTE — Plan of Care (Incomplete)
Patient A&Ox4, from home, independent in room

## 2023-03-16 NOTE — ED Triage Notes (Signed)
Pt sent by neurology for "sky high blood pressure" (170's/120's per pt) and headache over right eye x5 days that has not improved since last in ED. Pt has tried Fioricet, ibuprofen, ativan, Reglan without relief. Pt c/o dilated right eye and blurry vision in right eye and states she hasn't taken eye drops for several days. Pt AOX4, NAD noted.

## 2023-03-16 NOTE — Consult Note (Signed)
Subjective: Pt states that her eye feels fine. States that she missed 3 follow up appointments with me at the [facility] b/c of transportation issues. States that she had continued the Vigamox q 1 hour OD in the meantime. No vision complaint OU She complains of constant pain " behind her eye" Unable to sleep due to severity of it.  Objective: Vital signs in last 24 hours: Temp:  [97.8 F (36.6 C)-99.1 F (37.3 C)] 97.8 F (36.6 C) (06/21 1650) Pulse Rate:  [103] 103 (06/21 1120) Resp:  [18] 18 (06/21 1120) BP: (147-170)/(102-120) 147/102 (06/21 1120) SpO2:  [98 %] 98 % (06/21 1120) Weight:  [104.8 kg] 104.8 kg (06/21 1012)    Objective:  Focused exam based comparison to last exam in the office.  All findings are OD.   OS is uninvolved.  Good UCVA .   Pupil is pharmacologically dilated. Motility is normal. IOP 15 Compact cornea. Closed corneal ulcer at 7 o'clock. Small remaining infiltrate vs scar. Quiet AC    Recent Labs    03/16/23 1221 03/16/23 1305  WBC 9.8  --   HGB 14.7  --   HCT 44.8  --   NA  --  138  K  --  4.2  CL  --  103  CO2  --  27  BUN  --  12  CREATININE  --  0.72    Studies/Results: recent CT head indicates that there is no orbital lesion No results found.  Medications: I have reviewed the patient's current medications.  Assessment/Plan: Remarkably improved corneal ulcer OD.  Change vigamox  to 1 gtt q 2 hours. D/C  Vancomycin Continue cyclogyl 1 gtt TID. Further eval for headache/ retrobulbar pain might include neuro consult, might warrant MRI orbits. Continue in patient pain control until deemed appropriate for discharge.  Follow up with [facility] 1 -2 days after discharge. Continue all drops until then.  LOS: 0 days   Galen Manila 6/21/20245:31 PM

## 2023-03-17 ENCOUNTER — Encounter (HOSPITAL_COMMUNITY): Payer: Self-pay

## 2023-03-17 ENCOUNTER — Ambulatory Visit (HOSPITAL_COMMUNITY): Payer: Commercial Managed Care - PPO | Admitting: Psychiatry

## 2023-03-17 ENCOUNTER — Observation Stay: Payer: Commercial Managed Care - PPO

## 2023-03-17 ENCOUNTER — Ambulatory Visit (HOSPITAL_COMMUNITY): Payer: Self-pay | Admitting: Psychiatry

## 2023-03-17 DIAGNOSIS — R519 Headache, unspecified: Secondary | ICD-10-CM

## 2023-03-17 DIAGNOSIS — H547 Unspecified visual loss: Secondary | ICD-10-CM | POA: Diagnosis not present

## 2023-03-17 DIAGNOSIS — H16001 Unspecified corneal ulcer, right eye: Secondary | ICD-10-CM | POA: Diagnosis not present

## 2023-03-17 DIAGNOSIS — H5711 Ocular pain, right eye: Secondary | ICD-10-CM | POA: Diagnosis not present

## 2023-03-17 DIAGNOSIS — H5710 Ocular pain, unspecified eye: Secondary | ICD-10-CM

## 2023-03-17 LAB — HIV ANTIBODY (ROUTINE TESTING W REFLEX): HIV Screen 4th Generation wRfx: NONREACTIVE

## 2023-03-17 LAB — CULTURE, FUNGUS WITHOUT SMEAR

## 2023-03-17 MED ORDER — SODIUM CHLORIDE 0.9 % IV SOLN: 12.5000 mg | Freq: Four times a day (QID) | INTRAVENOUS | Status: DC | PRN

## 2023-03-17 MED ORDER — PROCHLORPERAZINE EDISYLATE 10 MG/2ML IJ SOLN
10.0000 mg | Freq: Once | INTRAMUSCULAR | Status: AC
  Administered 2023-03-17: 10 mg via INTRAVENOUS
  Filled 2023-03-17: qty 2

## 2023-03-17 MED ORDER — HYDROMORPHONE HCL 1 MG/ML IJ SOLN
1.0000 mg | INTRAMUSCULAR | Status: DC | PRN
  Administered 2023-03-17 – 2023-03-18 (×5): 1 mg via INTRAVENOUS
  Filled 2023-03-17 (×6): qty 1

## 2023-03-17 MED ORDER — GADOBUTROL 1 MMOL/ML IV SOLN
10.0000 mL | Freq: Once | INTRAVENOUS | Status: AC | PRN
  Administered 2023-03-17: 10 mL via INTRAVENOUS

## 2023-03-17 MED ORDER — DIPHENHYDRAMINE HCL 50 MG/ML IJ SOLN
25.0000 mg | Freq: Once | INTRAMUSCULAR | Status: AC
  Administered 2023-03-17: 25 mg via INTRAVENOUS
  Filled 2023-03-17: qty 1

## 2023-03-17 NOTE — Progress Notes (Signed)
Progress Note    Hannah Ballard  ZOX:[id number] DOB: 07-30-1981  DOA: 03/16/2023 PCP: [name], NP      Brief Narrative:    Medical records reviewed and are as summarized below:  Hannah Ballard is a 42 y.o. female medical history significant for hypertension (off of antihypertensives for 8 months) migraine headache, hypothyroidism, anxiety, depression, obesity, recently diagnosed with right corneal ulcer.  She presented to the hospital because of severe pain in the right eye.  Pain was about 9/10 in severity when she got to the ED.  Pain is worse with eye movements.  No known relieving factors.  It is associated with right retro-orbital pain.  She has been seeing Dr. Druscilla Brownie, ophthalmologist, since 03/08/2023.  She had been going to on a daily basis.  However, she missed the appointment on 03/14/2023 and has not seen him since that time.  She had been prescribed moxifloxacin eyedrops, steroid eyedrop and another eyedrop which is a muscle relaxant.  However, she said that she had misplaced the muscle relaxant eyedrop at the doctor's office on 03/12/2023 and never got to use it.          Assessment/Plan:   Principal Problem:   Acute right eye pain Active Problems:   Headache   Corneal ulcer of right eye    Body mass index is 34.12 kg/m.  (Obesity)   Acute right eye pain, right corneal ulcer, right retro-orbital headache:  Increase IV Dilaudid from 0.5 to 1 mg every 3 hours as needed for severe pain.  One-time dose of IV prochlorperazine and Benadryl were prescribed as well.  Continue IV Phenergan as needed for nausea and oxycodone for pain. Continue gatifloxacin eyedrops and cyclopentolate eyedrops. Consulted Dr. Selina Cooley, neurologist, to assist with management. She recommended MRI brain and MRI orbits with and without contrast. Appreciate input from Dr. Druscilla Brownie, ophthalmologist.     Hypertensive urgency, history of hypertension: BP has improved.  No antihypertensives  for now.  Patient said she has been off of antihypertensives for 8 months because her blood pressure has been okay.       Obesity (BMI 34.1): She says she started tirzepatide injection for weight loss (about 3 weeks prior to admission)     She saw the neurosurgeon at the office on 03/16/2023 because of neck pain/headache and history of anterior cervical spine fusion.     Other comorbidities include migraine headache, hypothyroidism, anxiety   Diet Order             Diet Heart Room service appropriate? Yes; Fluid consistency: Thin  Diet effective now                            Consultants: Neurologist  Procedures: None    Medications:    cyclopentolate  1 drop Right Eye TID   gatifloxacin  1 drop Right Eye Q1H   heparin  5,000 Units Subcutaneous Q8H   levothyroxine  150 mcg Oral Q0600   pantoprazole  40 mg Oral Daily   Continuous Infusions:   Anti-infectives (From admission, onward)    Start     Dose/Rate Route Frequency Ordered Stop   03/16/23 1800  vancomycin 25 mg/mL compounded topical ophthalmic solution  Status:  Discontinued        2 drop Ophthalmic Every hour 03/16/23 1623 03/16/23 1739   03/16/23 1615  vancomycin 25 mg/mL compounded topical ophthalmic solution  Status:  Discontinued  1 drop Ophthalmic 4 times daily 03/16/23 1603 03/16/23 1623              Family Communication/Anticipated D/C date and plan/Code Status   DVT prophylaxis: heparin injection 5,000 Units Start: 03/17/23 1400     Code Status: Full Code  Family Communication: None Disposition Plan: Plan to discharge home tomorrow   Status is: Observation The patient will require care spanning > 2 midnights and should be moved to inpatient because: Pain control, MRI brain for further evaluation       Subjective:   Interval events noted.  She complains of nausea and severe right retro-orbital headache.  IV Dilaudid only helped a little bit.  Objective:     Vitals:   03/16/23 2049 03/16/23 2100 03/17/23 0810 03/17/23 1522  BP: (!) 148/96  (!) 147/86 129/68  Pulse: 73  73 63  Resp: 20  18 18   Temp: 97.6 F (36.4 C)  (!) 97.4 F (36.3 C) 97.9 F (36.6 C)  TempSrc:      SpO2: 100%  100% 99%  Weight:  104.8 kg    Height:  5\' 9"  (1.753 m)     No data found.  No intake or output data in the 24 hours ending 03/17/23 1648 Filed Weights   03/16/23 2100  Weight: 104.8 kg    Exam:  GEN: NAD SKIN: Warm and dry EYES: EOMI, dilated right eye pupil ENT: MMM CV: RRR PULM: CTA B ABD: soft, obese, NT, +BS CNS: AAO x 3, non focal EXT: No edema or tenderness        Data Reviewed:   I have personally reviewed following labs and imaging studies:  Labs: Labs show the following:   Basic Metabolic Panel: Recent Labs  Lab 03/16/23 1305  NA 138  K 4.2  CL 103  CO2 27  GLUCOSE 101*  BUN 12  CREATININE 0.72  CALCIUM 8.0*   GFR Estimated Creatinine Clearance: 119.2 mL/min (by C-G formula based on SCr of 0.72 mg/dL). Liver Function Tests: No results for input(s): "AST", "ALT", "ALKPHOS", "BILITOT", "PROT", "ALBUMIN" in the last 168 hours. No results for input(s): "LIPASE", "AMYLASE" in the last 168 hours. No results for input(s): "AMMONIA" in the last 168 hours. Coagulation profile No results for input(s): "INR", "PROTIME" in the last 168 hours.  CBC: Recent Labs  Lab 03/16/23 1221  WBC 9.8  NEUTROABS 6.8  HGB 14.7  HCT 44.8  MCV 93.9  PLT 337   Cardiac Enzymes: No results for input(s): "CKTOTAL", "CKMB", "CKMBINDEX", "TROPONINI" in the last 168 hours. BNP (last 3 results) No results for input(s): "PROBNP" in the last 8760 hours. CBG: No results for input(s): "GLUCAP" in the last 168 hours. D-Dimer: No results for input(s): "DDIMER" in the last 72 hours. Hgb A1c: No results for input(s): "HGBA1C" in the last 72 hours. Lipid Profile: No results for input(s): "CHOL", "HDL", "LDLCALC", "TRIG", "CHOLHDL",  "LDLDIRECT" in the last 72 hours. Thyroid function studies: No results for input(s): "TSH", "T4TOTAL", "T3FREE", "THYROIDAB" in the last 72 hours.  Invalid input(s): "FREET3" Anemia work up: No results for input(s): "VITAMINB12", "FOLATE", "FERRITIN", "TIBC", "IRON", "RETICCTPCT" in the last 72 hours. Sepsis Labs: Recent Labs  Lab 03/16/23 1221  WBC 9.8    Microbiology Recent Results (from the past 240 hour(s))  Aerobic Culture w Gram Stain (superficial specimen)     Status: None   Collection Time: 03/09/23  1:00 PM   Specimen: Eye; Wound  Result Value Ref Range Status  Specimen Description   Final    EYE Performed at [facility], [address] [place], [place] [address]    Special Requests   Final    NONE Performed at [facility], [address] [place], [place] [address]    Gram Stain NO WBC SEEN NO ORGANISMS SEEN   Final   Culture   Final    NO GROWTH 3 DAYS Performed at [facility], [address], [place], [place] [address]    Report Status 03/12/2023 FINAL  Final  Culture, Fungus without Smear     Status: None (Preliminary result)   Collection Time: 03/09/23  1:00 PM   Specimen: Eye  Result Value Ref Range Status   Specimen Description EYE  Final   Special Requests RIGHT  Final   Culture   Final    NO FUNGUS ISOLATED AFTER 7 DAYS Performed at [facility], [address], [place], [place] [address]    Report Status PENDING  Incomplete    Procedures and diagnostic studies:  No results found.             LOS: 0 days   Masaki Rothbauer  [facility]   Pager on [email or web address]. If 7PM-7AM, please contact night-coverage at [email or web address]     03/17/2023, 4:48 PM

## 2023-03-17 NOTE — Progress Notes (Addendum)
MRI dept given report on PT and is on the way to transport. Pt C/o of pain and nausea. PRN meds given.  Pt returned from MRI. Pt tolerated scan well.

## 2023-03-17 NOTE — Plan of Care (Signed)
  Problem: Safety: Goal: Ability to remain free from injury will improve Outcome: Progressing   

## 2023-03-18 DIAGNOSIS — H16001 Unspecified corneal ulcer, right eye: Secondary | ICD-10-CM | POA: Diagnosis not present

## 2023-03-18 DIAGNOSIS — R519 Headache, unspecified: Secondary | ICD-10-CM | POA: Diagnosis not present

## 2023-03-18 DIAGNOSIS — H5711 Ocular pain, right eye: Secondary | ICD-10-CM | POA: Diagnosis not present

## 2023-03-18 MED ORDER — HYDROCODONE-ACETAMINOPHEN 5-325 MG PO TABS: 1.0000 | ORAL_TABLET | Freq: Once | ORAL | Status: DC

## 2023-03-18 MED ORDER — HYDROCODONE-ACETAMINOPHEN 5-325 MG PO TABS
1.0000 | ORAL_TABLET | Freq: Four times a day (QID) | ORAL | Status: DC | PRN
  Administered 2023-03-18 – 2023-03-19 (×3): 2 via ORAL
  Filled 2023-03-18 (×3): qty 2

## 2023-03-18 MED ORDER — HYDROCODONE-ACETAMINOPHEN 5-325 MG PO TABS
1.0000 | ORAL_TABLET | Freq: Four times a day (QID) | ORAL | Status: DC | PRN
  Administered 2023-03-18: 1 via ORAL
  Filled 2023-03-18: qty 1

## 2023-03-18 MED ORDER — PROMETHAZINE HCL 25 MG PO TABS
25.0000 mg | ORAL_TABLET | Freq: Three times a day (TID) | ORAL | Status: DC | PRN
  Administered 2023-03-18 – 2023-03-19 (×2): 25 mg via ORAL
  Filled 2023-03-18 (×3): qty 1

## 2023-03-18 NOTE — Consult Note (Signed)
NEUROLOGY CONSULTATION NOTE   Date of service: March 18, 2023 Patient Name: Hannah Ballard MRN:  [id number] DOB:  08/29/1981 Reason for consult: retro-orbital pain Requesting physician: Dr. Lurene Shadow _ _ _   _ __   _ __ _ _  __ __   _ __   __ _  History of Present Illness   This is a 42 yo woman with hx HTN, migraine, hypothyroidsim, anxiety, depression, and a recently diagnosed corneal ulcer. She presented to ED with severe (9/10) pain in R eye that was worsened with eye movement. She additionally has severe but sqeezing/pressure sendation behind her R eye. She has hx migraines but they are infrequent and feel different than this (typically throb and are bilateral). She was seen by Dr. Druscilla Brownie from ophtho on a daily basis as an outpatient but missed her past 3 appointments 2/2 lack of transportation. She was prescribed moxifloxacin eyedrops as well as one that is a muscle relaxant and reports compliance with both. She had an outside CT which I personally reviewed (actual images) and was unremarkable.   ROS   Per HPI: all other systems reviewed and are negative  Past History   I have reviewed the following:  Past Medical History:  Diagnosis Date   Cancer (HCC)    Hypertension    Migraines    Thyroid disease    Past Surgical History:  Procedure Laterality Date   ABDOMINAL HYSTERECTOMY  2018   ANKLE SURGERY Left    has hardware   ANTERIOR FUSION CERVICAL SPINE  2008   APPENDECTOMY  1997   BREAST BIOPSY     BREAST BIOPSY Right 03/12/2023   MM RT BREAST BX W LOC DEV 1ST LESION IMAGE BX SPEC STEREO GUIDE 03/12/2023 [facility]   CHOLECYSTECTOMY     FRACTURE SURGERY     SHOULDER SURGERY Left    has pins   SPINE SURGERY     Family History  Problem Relation Age of Onset   Healthy Mother    Healthy Father    Social History   Socioeconomic History   Marital status: Married    Spouse name: Not on file   Number of children: Not on file   Years of education: Not on file    Highest education level: Associate degree: occupational, Scientist, product/process development, or vocational program  Occupational History   Not on file  Tobacco Use   Smoking status: Former    Types: Cigarettes    Quit date: 2019    Years since quitting: 5.4   Smokeless tobacco: Never  Vaping Use   Vaping Use: Never used  Substance and Sexual Activity   Alcohol use: Never   Drug use: Never   Sexual activity: Yes  Other Topics Concern   Not on file  Social History Narrative   Not on file   Social Determinants of Health   Financial Resource Strain: High Risk (02/07/2023)   Overall Financial Resource Strain (CARDIA)    Difficulty of Paying Living Expenses: Hard  Food Insecurity: Food Insecurity Present (03/16/2023)   Hunger Vital Sign    Worried About Running Out of Food in the Last Year: Sometimes true    Ran Out of Food in the Last Year: Sometimes true  Transportation Needs: Unmet Transportation Needs (03/16/2023)   PRAPARE - Transportation    Lack of Transportation (Medical): Yes    Lack of Transportation (Non-Medical): Yes  Physical Activity: Sufficiently Active (02/07/2023)   Exercise Vital Sign    Days of  Exercise per Week: 5 days    Minutes of Exercise per Session: 60 min  Stress: Stress Concern Present (02/07/2023)   [employer] of Occupational Health - Occupational Stress Questionnaire    Feeling of Stress : Very much  Social Connections: Socially Integrated (02/07/2023)   Social Connection and Isolation Panel [NHANES]    Frequency of Communication with Friends and Family: Three times a week    Frequency of Social Gatherings with Friends and Family: Once a week    Attends Religious Services: More than 4 times per year    Active Member of [employer] or Organizations: Yes    Attends Engineer, structural: More than 4 times per year    Marital Status: Married   Allergies  Allergen Reactions   Aspirin Swelling and Anaphylaxis    Tongue swelling Other reaction(s): Edema  face/lips/tongue Facial swelling    Morphine Anaphylaxis and Swelling    Medications   Medications Prior to Admission  Medication Sig Dispense Refill Last Dose   butalbital-acetaminophen-caffeine (FIORICET) 50-325-40 MG tablet Take 1 tablet by mouth every 6 (six) hours as needed for headache. 12 tablet 0    levothyroxine (SYNTHROID) 150 MCG tablet Take 1 tablet (150 mcg total) by mouth daily. 90 tablet 3    [EXPIRED] LORazepam (ATIVAN) 0.5 MG tablet Take 1 tablet (0.5 mg total) by mouth 3 (three) times daily as needed for up to 3 days for anxiety. 9 tablet 0    metoCLOPramide (REGLAN) 10 MG tablet Take 1 tablet (10 mg total) by mouth 3 (three) times daily with meals for 10 days. 30 tablet 0    pantoprazole (PROTONIX) 40 MG tablet Take 1 tablet (40 mg total) by mouth daily. 30 tablet 0    tirzepatide (ZEPBOUND) 2.5 MG/0.5ML Pen Inject 2.5 mg into the skin once a week.         Current Facility-Administered Medications:    acetaminophen (TYLENOL) tablet 650 mg, 650 mg, Oral, Q6H PRN, 650 mg at 03/17/23 0117 **OR** acetaminophen (TYLENOL) suppository 650 mg, 650 mg, Rectal, Q6H PRN, Lurene Shadow, MD   butalbital-acetaminophen-caffeine (FIORICET) 50-325-40 MG per tablet 1 tablet, 1 tablet, Oral, Q6H PRN, Lurene Shadow, MD   cyclopentolate (CYCLODRYL,CYCLOGYL) 1 % ophthalmic solution 1 drop, 1 drop, Right Eye, TID, Lurene Shadow, MD, 1 drop at 03/18/23 0930   gatifloxacin (ZYMAXID) 0.5 % ophthalmic drops 1 drop, 1 drop, Right Eye, Q1H, Lurene Shadow, MD, 1 drop at 03/18/23 1226   heparin injection 5,000 Units, 5,000 Units, Subcutaneous, Q8H, Lurene Shadow, MD, 5,000 Units at 03/18/23 0514   HYDROcodone-acetaminophen (NORCO/VICODIN) 5-325 MG per tablet 1 tablet, 1 tablet, Oral, Q6H PRN, Lurene Shadow, MD, 1 tablet at 03/18/23 1225   levothyroxine (SYNTHROID) tablet 150 mcg, 150 mcg, Oral, Q0600, Lurene Shadow, MD, 150 mcg at 03/18/23 0510   LORazepam (ATIVAN) tablet 0.5 mg, 0.5 mg, Oral,  TID PRN, Lurene Shadow, MD, 0.5 mg at 03/17/23 0120   ondansetron (ZOFRAN) tablet 4 mg, 4 mg, Oral, Q6H PRN, 4 mg at 03/18/23 0824 **OR** ondansetron (ZOFRAN) injection 4 mg, 4 mg, Intravenous, Q6H PRN, Lurene Shadow, MD, 4 mg at 03/17/23 1947   pantoprazole (PROTONIX) EC tablet 40 mg, 40 mg, Oral, Daily, Lurene Shadow, MD, 40 mg at 03/18/23 0926   promethazine (PHENERGAN) 12.5 mg in sodium chloride 0.9 % 50 mL IVPB, 12.5 mg, Intravenous, Q6H PRN, Lurene Shadow, MD  Vitals   Vitals:   03/17/23 0810 03/17/23 1522 03/17/23 2331 03/18/23 0757  BP: (!) 147/86 129/68  122/78 127/77  Pulse: 73 63 64 80  Resp: 18 18 18 18   Temp: (!) 97.4 F (36.3 C) 97.9 F (36.6 C) 97.7 F (36.5 C) 97.7 F (36.5 C)  TempSrc:      SpO2: 100% 99% 97% 99%  Weight:      Height:         Body mass index is 34.12 kg/m.  Physical Exam   Physical Exam Gen: A&O x4, NAD HEENT: Atraumatic, normocephalic;mucous membranes moist; oropharynx clear, tongue without atrophy or fasciculations, R conjunctival injection Neck: Supple, trachea midline. Resp: CTAB, no w/r/r CV: RRR, no m/g/r; nml S1 and S2. 2+ symmetric peripheral pulses. Abd: soft/NT/ND; nabs x 4 quad Extrem: Nml bulk; no cyanosis, clubbing, or edema.  Neuro: *MS: A&O x4. Follows multi-step commands.  *Speech: fluid, nondysarthric, able to name and repeat *CN:    I: Deferred   II,III: PERRLA, VFF by confrontation, optic discs unable to be visualized 2/2 pupillary constriction   III,IV,VI: EOMI w/o nystagmus, no ptosis. Pain upon R eye movement still present but less severe than on presentation.   V: Sensation intact from V1 to V3 to LT   VII: Eyelid closure was full.  Smile symmetric.   VIII: Hearing intact to voice   IX,X: Voice normal, palate elevates symmetrically    XI: SCM/trap 5/5 bilat   XII: Tongue protrudes midline, no atrophy or fasciculations   *Motor:   Normal bulk.  No tremor, rigidity or bradykinesia. No pronator drift.     Strength: Dlt Bic Tri WrE WrF FgS Gr HF KnF KnE PlF DoF    Left 5 5 5 5 5 5 5 5 5 5 5 5     Right 5 5 5 5 5 5 5 5 5 5 5 5     *Sensory: Intact to light touch, pinprick, temperature vibration throughout. Symmetric. Propioception intact bilat.  No double-simultaneous extinction.  *Coordination:  Finger-to-nose, heel-to-shin, rapid alternating motions were intact. *Reflexes:  2+ and symmetric throughout without clonus; toes down-going bilat *Gait: deferred   Labs   CBC:  Recent Labs  Lab 03/16/23 1221  WBC 9.8  NEUTROABS 6.8  HGB 14.7  HCT 44.8  MCV 93.9  PLT 337    Basic Metabolic Panel:  Lab Results  Component Value Date   NA 138 03/16/2023   K 4.2 03/16/2023   CO2 27 03/16/2023   GLUCOSE 101 (H) 03/16/2023   BUN 12 03/16/2023   CREATININE 0.72 03/16/2023   CALCIUM 8.0 (L) 03/16/2023   GFRNONAA >60 03/16/2023   GFRAA >60 03/21/2018   Lipid Panel:  Lab Results  Component Value Date   LDLCALC 88 02/07/2023   HgbA1c: No results found for: "HGBA1C" Urine Drug Screen: No results found for: "LABOPIA", "COCAINSCRNUR", "LABBENZ", "AMPHETMU", "THCU", "LABBARB"  Alcohol Level No results found for: "ETH"  She had an outside CT which I personally reviewed (actual images) and was unremarkable.   Impression   This is a 42 yo woman with hx HTN, migraine, hypothyroidsim, anxiety, depression, and a recently diagnosed corneal ulcer admitted with severe pain in the front of her eyeball, pain with eye movement, and severe reto-orbital headache.She told Dr. Druscilla Brownie when he saw her this weekend that she had no pain in her actual eye anymore, only behind it, but she tells me that both types of pain are still present. She has hx migraines and I favor the retro-orbital pain to be a headache variant that is a response to the pain in her eye.  However bc she describes the retro-orbital pain as pressure/squeezing will order MRI brain and orbits without contrast to rule out a structural etiology  for the retro-orbital pain.  Recommendations   - MRI brain and orbits wo contrast - If these are unremarkable she may be discharged with close ophtho f/u outpatient. ______________________________________________________________________   Thank you for the opportunity to take part in the care of this patient. If you have any further questions, please contact the neurology consultation attending.  Signed,  Bing Neighbors, MD [facility] [phone]  If 7pm- 7am, please page neurology on call as listed in AMION.  **Any copied and pasted documentation in this note was written by me in another application not billed for and pasted by me into this document.

## 2023-03-18 NOTE — TOC CM/SW Note (Signed)
Transition of Care [facility]) - Inpatient Brief Assessment   Patient Details  Name: Hannah Ballard MRN: [id number] Date of Birth: Feb 19, 1981  Transition of [facility]) CM/SW Contact:    Kemper Durie, RN Phone Number: [phone] 03/18/2023, 8:56 AM   Clinical Narrative:  TOC brief assessment completed, no TOC needs identified at this time.   Transition of Care Asessment: Insurance and Status: Insurance coverage has been reviewed Patient has primary care physician: Yes     Prior/Current Home Services: No current home services Social Determinants of Health Reivew: SDOH reviewed no interventions necessary Readmission risk has been reviewed: Yes Transition of care needs: no transition of care needs at this time

## 2023-03-18 NOTE — Plan of Care (Signed)
  Problem: Activity: Goal: Risk for activity intolerance will decrease Outcome: Progressing   Problem: Elimination: Goal: Will not experience complications related to urinary retention Outcome: Progressing   Problem: Safety: Goal: Ability to remain free from injury will improve Outcome: Progressing   

## 2023-03-18 NOTE — Progress Notes (Addendum)
Progress Note    Hannah Ballard  MVH:[id number] DOB: December 12, 1980  DOA: 03/16/2023 PCP: [name], NP      Brief Narrative:    Medical records reviewed and are as summarized below:  Hannah Ballard is a 42 y.o. female medical history significant for hypertension (off of antihypertensives for 8 months) migraine headache, hypothyroidism, anxiety, depression, obesity, recently diagnosed with right corneal ulcer.  She presented to the hospital because of severe pain in the right eye.  Pain was about 9/10 in severity when she got to the ED.  Pain is worse with eye movements.  No known relieving factors.  It is associated with right retro-orbital pain.  She has been seeing Dr. Druscilla Brownie, ophthalmologist, since 03/08/2023.  She had been going to on a daily basis.  However, she missed the appointment on 03/14/2023 and has not seen him since that time.  She had been prescribed moxifloxacin eyedrops, steroid eyedrop and another eyedrop which is a muscle relaxant.  However, she said that she had misplaced the muscle relaxant eyedrop at the doctor's office on 03/12/2023 and never got to use it.          Assessment/Plan:   Principal Problem:   Acute right eye pain Active Problems:   Headache   Corneal ulcer of right eye    Body mass index is 34.12 kg/m.  (Obesity)   Acute right eye pain, right corneal ulcer, right retro-orbital headache:  MRI brain and MRI orbits did not show any acute abnormality.  Incidentally there was a small retention cyst in the right maxillary sinus.  Outpatient follow-up with ENT was recommended. Continue gatifloxacin eyedrops and cyclopentolate eyedrops. Continue IV Dilaudid and oxycodone.  Will try Vicodin as needed for pain. Appreciate input from Dr. Selina Cooley, neurologist.  She has signed off. Outpatient follow-up with Dr. Druscilla Brownie, ophthalmologist.     Hypertensive urgency, history of hypertension: BP has improved.  No antihypertensives for now.  Patient said  she has been off of antihypertensives for 8 months because her blood pressure has been okay.       Obesity (BMI 34.1): She says she started tirzepatide injection for weight loss (about 3 weeks prior to admission)     She saw the neurosurgeon at the office on 03/16/2023 because of neck pain/headache and history of anterior cervical spine fusion.     Other comorbidities include migraine headache, hypothyroidism, anxiety  She did not want to be discharged home early today.  She wanted to wait until late afternoon or evening to see whether headache will get worse. Possible discharge today to home later today.  ADDENDUM:   Patient doesn't feel comfortable going home today.   Diet Order             Diet Heart Room service appropriate? Yes; Fluid consistency: Thin  Diet effective now                            Consultants: Neurologist  Procedures: None    Medications:    cyclopentolate  1 drop Right Eye TID   gatifloxacin  1 drop Right Eye Q1H   heparin  5,000 Units Subcutaneous Q8H   levothyroxine  150 mcg Oral Q0600   pantoprazole  40 mg Oral Daily   Continuous Infusions:  promethazine (PHENERGAN) injection (IM or IVPB)       Anti-infectives (From admission, onward)    Start     Dose/Rate Route Frequency Ordered  Stop   03/16/23 1800  vancomycin 25 mg/mL compounded topical ophthalmic solution  Status:  Discontinued        2 drop Ophthalmic Every hour 03/16/23 1623 03/16/23 1739   03/16/23 1615  vancomycin 25 mg/mL compounded topical ophthalmic solution  Status:  Discontinued        1 drop Ophthalmic 4 times daily 03/16/23 1603 03/16/23 1623              Family Communication/Anticipated D/C date and plan/Code Status   DVT prophylaxis: heparin injection 5,000 Units Start: 03/17/23 1400     Code Status: Full Code  Family Communication: Plan discussed with the husband at the bedside Disposition Plan: Plan to discharge home  tomorrow   Status is: Observation The patient will require care spanning > 2 midnights and should be moved to inpatient because: Pain control, MRI brain for further evaluation       Subjective:   Interval events noted.  She complains of nausea.  Headache is a little better today.  She said nothing seems to work for the headache.  She is anxious about going home because she is concerned that headache would get worse and she would have to come back to the ED.  Her husband was at the bedside.  Objective:    Vitals:   03/17/23 0810 03/17/23 1522 03/17/23 2331 03/18/23 0757  BP: (!) 147/86 129/68 122/78 127/77  Pulse: 73 63 64 80  Resp: 18 18 18 18   Temp: (!) 97.4 F (36.3 C) 97.9 F (36.6 C) 97.7 F (36.5 C) 97.7 F (36.5 C)  TempSrc:      SpO2: 100% 99% 97% 99%  Weight:      Height:       No data found.  No intake or output data in the 24 hours ending 03/18/23 1040 Filed Weights   03/16/23 2100  Weight: 104.8 kg    Exam:  GEN: NAD SKIN: Warm and dry EYES: Right pupil is dilated and minimally reactive ENT: MMM CV: RRR PULM: CTA B ABD: soft, obese, NT, +BS CNS: AAO x 3, non focal EXT: No edema or tenderness       Data Reviewed:   I have personally reviewed following labs and imaging studies:  Labs: Labs show the following:   Basic Metabolic Panel: Recent Labs  Lab 03/16/23 1305  NA 138  K 4.2  CL 103  CO2 27  GLUCOSE 101*  BUN 12  CREATININE 0.72  CALCIUM 8.0*   GFR Estimated Creatinine Clearance: 119.2 mL/min (by C-G formula based on SCr of 0.72 mg/dL). Liver Function Tests: No results for input(s): "AST", "ALT", "ALKPHOS", "BILITOT", "PROT", "ALBUMIN" in the last 168 hours. No results for input(s): "LIPASE", "AMYLASE" in the last 168 hours. No results for input(s): "AMMONIA" in the last 168 hours. Coagulation profile No results for input(s): "INR", "PROTIME" in the last 168 hours.  CBC: Recent Labs  Lab 03/16/23 1221  WBC 9.8   NEUTROABS 6.8  HGB 14.7  HCT 44.8  MCV 93.9  PLT 337   Cardiac Enzymes: No results for input(s): "CKTOTAL", "CKMB", "CKMBINDEX", "TROPONINI" in the last 168 hours. BNP (last 3 results) No results for input(s): "PROBNP" in the last 8760 hours. CBG: No results for input(s): "GLUCAP" in the last 168 hours. D-Dimer: No results for input(s): "DDIMER" in the last 72 hours. Hgb A1c: No results for input(s): "HGBA1C" in the last 72 hours. Lipid Profile: No results for input(s): "CHOL", "HDL", "LDLCALC", "TRIG", "CHOLHDL", "LDLDIRECT"  in the last 72 hours. Thyroid function studies: No results for input(s): "TSH", "T4TOTAL", "T3FREE", "THYROIDAB" in the last 72 hours.  Invalid input(s): "FREET3" Anemia work up: No results for input(s): "VITAMINB12", "FOLATE", "FERRITIN", "TIBC", "IRON", "RETICCTPCT" in the last 72 hours. Sepsis Labs: Recent Labs  Lab 03/16/23 1221  WBC 9.8    Microbiology Recent Results (from the past 240 hour(s))  Aerobic Culture w Gram Stain (superficial specimen)     Status: None   Collection Time: 03/09/23  1:00 PM   Specimen: Eye; Wound  Result Value Ref Range Status   Specimen Description   Final    EYE Performed at [facility], [address], [place], [place] [address]    Special Requests   Final    NONE Performed at [facility], [address] [place], [place] [address]    Gram Stain NO WBC SEEN NO ORGANISMS SEEN   Final   Culture   Final    NO GROWTH 3 DAYS Performed at [facility], [address], [place], [place] [address]    Report Status 03/12/2023 FINAL  Final  Culture, Fungus without Smear     Status: None (Preliminary result)   Collection Time: 03/09/23  1:00 PM   Specimen: Eye  Result Value Ref Range Status   Specimen Description EYE  Final   Special Requests RIGHT  Final   Culture   Final    NO FUNGUS ISOLATED AFTER 8 DAYS Performed at [facility], [address] [place], [place] [address]     Report Status PENDING  Incomplete    Procedures and diagnostic studies:  MR BRAIN W WO CONTRAST  Result Date: 03/17/2023 CLINICAL DATA:  Headache and right periorbital pain EXAM: MRI HEAD AND ORBITS WITHOUT AND WITH CONTRAST TECHNIQUE: Multiplanar, multiecho pulse sequences of the brain and surrounding structures were obtained without and with intravenous contrast. Multiplanar, multiecho pulse sequences of the orbits and surrounding structures were obtained including fat saturation techniques, before and after intravenous contrast administration. CONTRAST:  10mL GADAVIST GADOBUTROL 1 MMOL/ML IV SOLN COMPARISON:  None Available. FINDINGS: MRI HEAD FINDINGS Brain: No acute infarction, hemorrhage, hydrocephalus, extra-axial collection or mass lesion. Vascular: Normal flow voids. Skull and upper cervical spine: Normal marrow signal. Other: None. MRI ORBITS FINDINGS Orbits: No traumatic or inflammatory finding. Globes, optic nerves, orbital fat, extraocular muscles, vascular structures, and lacrimal glands are normal. Visualized sinuses: Small retention cyst in the right maxillary sinus. Soft tissues: Negative. IMPRESSION: Normal MRI of the brain and orbits. Electronically Signed   By: Deatra Robinson M.D.   On: 03/17/2023 22:22   MR ORBITS W WO CONTRAST  Result Date: 03/17/2023 CLINICAL DATA:  Headache and right periorbital pain EXAM: MRI HEAD AND ORBITS WITHOUT AND WITH CONTRAST TECHNIQUE: Multiplanar, multiecho pulse sequences of the brain and surrounding structures were obtained without and with intravenous contrast. Multiplanar, multiecho pulse sequences of the orbits and surrounding structures were obtained including fat saturation techniques, before and after intravenous contrast administration. CONTRAST:  10mL GADAVIST GADOBUTROL 1 MMOL/ML IV SOLN COMPARISON:  None Available. FINDINGS: MRI HEAD FINDINGS Brain: No acute infarction, hemorrhage, hydrocephalus, extra-axial collection or mass lesion.  Vascular: Normal flow voids. Skull and upper cervical spine: Normal marrow signal. Other: None. MRI ORBITS FINDINGS Orbits: No traumatic or inflammatory finding. Globes, optic nerves, orbital fat, extraocular muscles, vascular structures, and lacrimal glands are normal. Visualized sinuses: Small retention cyst in the right maxillary sinus. Soft tissues: Negative. IMPRESSION: Normal MRI  of the brain and orbits. Electronically Signed   By: Deatra Robinson M.D.   On: 03/17/2023 22:22               LOS: 0 days   Kvion Shapley  [facility]   Pager on [email or web address]. If 7PM-7AM, please contact night-coverage at [email or web address]     03/18/2023, 10:40 AM

## 2023-03-18 NOTE — Progress Notes (Signed)
Neurology plan of care  Please see neurology consult note from yesterday for full findings and recommendations. MRI brain and orbits wwo was normal. No further inpatient neurologic workup indicated. From neuro standpoint OK to discharge with close outpatient ophtho f/u unless she has other indications for continued admission. Neurology to sign off but feel free to re-engage if additional neurologic concerns arise.  Bing Neighbors, MD [facility] [phone]  If 7pm- 7am, please page neurology on call as listed in AMION.

## 2023-03-19 ENCOUNTER — Other Ambulatory Visit: Payer: Self-pay

## 2023-03-19 DIAGNOSIS — Z5982 Transportation insecurity: Secondary | ICD-10-CM | POA: Diagnosis not present

## 2023-03-19 DIAGNOSIS — Z981 Arthrodesis status: Secondary | ICD-10-CM | POA: Diagnosis not present

## 2023-03-19 DIAGNOSIS — Z5941 Food insecurity: Secondary | ICD-10-CM | POA: Diagnosis not present

## 2023-03-19 DIAGNOSIS — F419 Anxiety disorder, unspecified: Secondary | ICD-10-CM | POA: Diagnosis not present

## 2023-03-19 DIAGNOSIS — Z886 Allergy status to analgesic agent status: Secondary | ICD-10-CM | POA: Diagnosis not present

## 2023-03-19 DIAGNOSIS — H547 Unspecified visual loss: Secondary | ICD-10-CM | POA: Diagnosis not present

## 2023-03-19 DIAGNOSIS — Z79890 Hormone replacement therapy (postmenopausal): Secondary | ICD-10-CM | POA: Diagnosis not present

## 2023-03-19 DIAGNOSIS — R110 Nausea: Secondary | ICD-10-CM | POA: Diagnosis present

## 2023-03-19 DIAGNOSIS — H16001 Unspecified corneal ulcer, right eye: Secondary | ICD-10-CM | POA: Diagnosis not present

## 2023-03-19 DIAGNOSIS — Z87891 Personal history of nicotine dependence: Secondary | ICD-10-CM | POA: Diagnosis not present

## 2023-03-19 DIAGNOSIS — Z885 Allergy status to narcotic agent status: Secondary | ICD-10-CM | POA: Diagnosis not present

## 2023-03-19 DIAGNOSIS — I10 Essential (primary) hypertension: Secondary | ICD-10-CM | POA: Diagnosis not present

## 2023-03-19 DIAGNOSIS — E039 Hypothyroidism, unspecified: Secondary | ICD-10-CM | POA: Diagnosis not present

## 2023-03-19 DIAGNOSIS — R519 Headache, unspecified: Secondary | ICD-10-CM | POA: Diagnosis not present

## 2023-03-19 DIAGNOSIS — Z7985 Long-term (current) use of injectable non-insulin antidiabetic drugs: Secondary | ICD-10-CM | POA: Diagnosis not present

## 2023-03-19 DIAGNOSIS — E669 Obesity, unspecified: Secondary | ICD-10-CM | POA: Diagnosis not present

## 2023-03-19 DIAGNOSIS — Z5986 Financial insecurity: Secondary | ICD-10-CM | POA: Diagnosis not present

## 2023-03-19 DIAGNOSIS — H5711 Ocular pain, right eye: Secondary | ICD-10-CM | POA: Diagnosis not present

## 2023-03-19 DIAGNOSIS — G43909 Migraine, unspecified, not intractable, without status migrainosus: Secondary | ICD-10-CM | POA: Diagnosis not present

## 2023-03-19 DIAGNOSIS — Z6834 Body mass index (BMI) 34.0-34.9, adult: Secondary | ICD-10-CM | POA: Diagnosis not present

## 2023-03-19 DIAGNOSIS — I160 Hypertensive urgency: Secondary | ICD-10-CM | POA: Diagnosis not present

## 2023-03-19 MED ORDER — PROMETHAZINE HCL 25 MG PO TABS
25.0000 mg | ORAL_TABLET | Freq: Three times a day (TID) | ORAL | 0 refills | Status: DC | PRN
  Filled 2023-03-19: qty 10, 4d supply, fill #0

## 2023-03-19 MED ORDER — CYCLOPENTOLATE HCL 1 % OP SOLN
1.0000 [drp] | Freq: Three times a day (TID) | OPHTHALMIC | 0 refills | Status: DC
  Filled 2023-03-19: qty 2, 14d supply, fill #0

## 2023-03-19 MED ORDER — HYDROCODONE-ACETAMINOPHEN 5-325 MG PO TABS: 1.0000 | ORAL_TABLET | Freq: Three times a day (TID) | ORAL | 0 refills | Status: DC | PRN

## 2023-03-19 MED ORDER — PROMETHAZINE HCL 25 MG PO TABS: 25.0000 mg | ORAL_TABLET | Freq: Three times a day (TID) | ORAL | 0 refills | Status: DC | PRN

## 2023-03-19 MED ORDER — GATIFLOXACIN 0.5 % OP SOLN: 1.0000 [drp] | OPHTHALMIC | Status: DC

## 2023-03-19 MED ORDER — CYCLOPENTOLATE HCL 1 % OP SOLN: 1.0000 [drp] | Freq: Three times a day (TID) | OPHTHALMIC | 0 refills | Status: DC

## 2023-03-19 MED ORDER — HYDROCODONE-ACETAMINOPHEN 5-325 MG PO TABS
1.0000 | ORAL_TABLET | Freq: Three times a day (TID) | ORAL | 0 refills | Status: DC | PRN
  Filled 2023-03-19: qty 10, 4d supply, fill #0

## 2023-03-19 NOTE — Plan of Care (Signed)

## 2023-03-19 NOTE — Discharge Summary (Signed)
Physician Discharge Summary   Patient: Hannah Ballard MRN: [id number] DOB: 09-Nov-1980  Admit date:     03/16/2023  Discharge date: 03/19/2023  Discharge Physician: Lurene Shadow   PCP: [name], NP   Recommendations at discharge:   Follow-up with Dr. Druscilla Brownie, ophthalmologist, on 03/20/2023 Follow-up with ENT specialist in 2 weeks  Discharge Diagnoses: Principal Problem:   Acute right eye pain Active Problems:   Headache   Corneal ulcer of right eye  Resolved Problems:   * No resolved hospital problems. *  Hospital Course:  Hannah Ballard is a 42 y.o. female medical history significant for hypertension (off of antihypertensives for 8 months) migraine headache, hypothyroidism, anxiety, depression, obesity, recently diagnosed with right corneal ulcer.  She presented to the hospital because of severe pain in the right eye.  Pain was about 9/10 in severity when she got to the ED.  Pain is worse with eye movements.  No known relieving factors.  It is associated with right retro-orbital pain.  She has been seeing Dr. Druscilla Brownie, ophthalmologist, since 03/08/2023.  She had been going to on a daily basis.  However, she missed the appointment on 03/14/2023 and has not seen him since that time.  She had been prescribed moxifloxacin eyedrops, steroid eyedrop and another eyedrop which is a muscle relaxant.  However, she said that she had misplaced the muscle relaxant eyedrop at the doctor's office on 03/12/2023 and never got to use it.     Assessment and Plan:   Acute right eye pain, right corneal ulcer, right retro-orbital headache: Improved.  She is comfortable going home today. MRI brain and MRI orbits did not show any acute abnormality.  Incidentally there was a small retention cyst in the right maxillary sinus.  Outpatient follow-up with ENT was recommended. Continue gatifloxacin eyedrops (she has this at home) Continue cyclopentolate eyedrops (new prescription provided). She will be  discharged on Vicodin/acetaminophen and Phenergan. Outpatient follow-up with Dr. Druscilla Brownie, ophthalmologist.     Hypertensive urgency, history of hypertension: BP has improved.  No antihypertensives for now.  Patient said she has been off of antihypertensives for 8 months because her blood pressure has been okay.       Obesity (BMI 34.1): She says she started tirzepatide injection for weight loss (about 3 weeks prior to admission)     She saw the neurosurgeon at the office on 03/16/2023 because of neck pain/headache and history of anterior cervical spine fusion.     Other comorbidities include migraine headache, hypothyroidism, anxiety    Discharge plan discussed with the patient and her husband at the bedside.      Consultants: Neurologist, ophthalmologist Procedures performed: None Disposition: Home Diet recommendation:  Discharge Diet Orders (From admission, onward)     Start     Ordered   03/19/23 0000  Diet - low sodium heart healthy        03/19/23 1017           Cardiac diet DISCHARGE MEDICATION: Allergies as of 03/19/2023       Reactions   Aspirin Swelling, Anaphylaxis   Tongue swelling Other reaction(s): Edema face/lips/tongue Facial swelling   Morphine Anaphylaxis, Swelling        Medication List     STOP taking these medications    LORazepam 0.5 MG tablet Commonly known as: Ativan   metoCLOPramide 10 MG tablet Commonly known as: REGLAN       TAKE these medications    butalbital-acetaminophen-caffeine 50-325-40 MG tablet Commonly known as: [employer]  Take 1 tablet by mouth every 6 (six) hours as needed for headache.   cyclopentolate 1 % ophthalmic solution Commonly known as: CYCLODRYL,CYCLOGYL Place 1 drop into the right eye 3 (three) times daily.   gatifloxacin 0.5 % Soln Commonly known as: ZYMAXID Place 1 drop into the right eye every hour.   HYDROcodone-acetaminophen 5-325 MG tablet Commonly known as: NORCO/VICODIN Take 1 tablet  by mouth every 8 (eight) hours as needed for moderate pain.   levothyroxine 150 MCG tablet Commonly known as: SYNTHROID Take 1 tablet (150 mcg total) by mouth daily.   pantoprazole 40 MG tablet Commonly known as: PROTONIX Take 1 tablet (40 mg total) by mouth daily.   promethazine 25 MG tablet Commonly known as: PHENERGAN Take 1 tablet (25 mg total) by mouth every 8 (eight) hours as needed for nausea or vomiting.   Zepbound 2.5 MG/0.5ML Pen Generic drug: tirzepatide Inject 2.5 mg into the skin once a week.        Follow-up Information     Linus Salmons, MD. Schedule an appointment as soon as possible for a visit in 2 week(s).   Specialty: Otolaryngology Contact information: [address] [place] [address] [phone]                Discharge Exam: [name] Weights   03/16/23 2100  Weight: 104.8 kg   GEN: NAD SKIN: Warm and dry EYES: Anicteric.  Right pupil dilated and minimally reactive ENT: MMM CV: RRR PULM: CTA B ABD: soft, obese, NT, +BS CNS: AAO x 3, non focal EXT: No edema or tenderness   Condition at discharge: good  The results of significant diagnostics from this hospitalization (including imaging, microbiology, ancillary and laboratory) are listed below for reference.   Imaging Studies: MR BRAIN W WO CONTRAST  Result Date: 03/17/2023 CLINICAL DATA:  Headache and right periorbital pain EXAM: MRI HEAD AND ORBITS WITHOUT AND WITH CONTRAST TECHNIQUE: Multiplanar, multiecho pulse sequences of the brain and surrounding structures were obtained without and with intravenous contrast. Multiplanar, multiecho pulse sequences of the orbits and surrounding structures were obtained including fat saturation techniques, before and after intravenous contrast administration. CONTRAST:  10mL GADAVIST GADOBUTROL 1 MMOL/ML IV SOLN COMPARISON:  None Available. FINDINGS: MRI HEAD FINDINGS Brain: No acute infarction, hemorrhage, hydrocephalus,  extra-axial collection or mass lesion. Vascular: Normal flow voids. Skull and upper cervical spine: Normal marrow signal. Other: None. MRI ORBITS FINDINGS Orbits: No traumatic or inflammatory finding. Globes, optic nerves, orbital fat, extraocular muscles, vascular structures, and lacrimal glands are normal. Visualized sinuses: Small retention cyst in the right maxillary sinus. Soft tissues: Negative. IMPRESSION: Normal MRI of the brain and orbits. Electronically Signed   By: Deatra Robinson M.D.   On: 03/17/2023 22:22   MR ORBITS W WO CONTRAST  Result Date: 03/17/2023 CLINICAL DATA:  Headache and right periorbital pain EXAM: MRI HEAD AND ORBITS WITHOUT AND WITH CONTRAST TECHNIQUE: Multiplanar, multiecho pulse sequences of the brain and surrounding structures were obtained without and with intravenous contrast. Multiplanar, multiecho pulse sequences of the orbits and surrounding structures were obtained including fat saturation techniques, before and after intravenous contrast administration. CONTRAST:  10mL GADAVIST GADOBUTROL 1 MMOL/ML IV SOLN COMPARISON:  None Available. FINDINGS: MRI HEAD FINDINGS Brain: No acute infarction, hemorrhage, hydrocephalus, extra-axial collection or mass lesion. Vascular: Normal flow voids. Skull and upper cervical spine: Normal marrow signal. Other: None. MRI ORBITS FINDINGS Orbits: No traumatic or inflammatory finding. Globes, optic nerves, orbital fat, extraocular muscles, vascular structures,  and lacrimal glands are normal. Visualized sinuses: Small retention cyst in the right maxillary sinus. Soft tissues: Negative. IMPRESSION: Normal MRI of the brain and orbits. Electronically Signed   By: Deatra Robinson M.D.   On: 03/17/2023 22:22   MM RT BREAST BX W LOC DEV 1ST LESION IMAGE BX SPEC STEREO GUIDE  Addendum Date: 03/13/2023   ADDENDUM REPORT: 03/13/2023 12:22 ADDENDUM: Pathology revealed FIBROCYSTIC CHANGES INCLUDING STROMAL FIBROSIS, CYSTIC DILATATION, AND COLUMNAR CELL  HYPERPLASIA WITH RARE ASSOCIATED MICROCALCIFICATIONS, FOCAL FIBROADENOMATOID CHANGE AND ADENOSIS of the RIGHT breast, upper outer, (coil clip). This was found to be concordant by Dr. Gerome Sam. Pathology results were discussed with the patient by telephone. The patient reported doing well after the biopsy with tenderness and bruising at the site. Post biopsy instructions and care were reviewed and questions were answered. The patient was encouraged to call [facility] for any additional concerns. The patient was instructed to return for RIGHT diagnostic mammography in 6 months and informed a reminder notice would be sent regarding this appointment. Pathology results reported by Rene Kocher, RN on 03/13/2023. Electronically Signed   By: Gerome Sam III M.D.   On: 03/13/2023 12:22   Result Date: 03/13/2023 CLINICAL DATA:  Biopsy possible right breast distortion. EXAM: RIGHT BREAST STEREOTACTIC CORE NEEDLE BIOPSY COMPARISON:  Previous exam(s). FINDINGS: The patient and I discussed the procedure of stereotactic-guided biopsy including benefits and alternatives. We discussed the high likelihood of a successful procedure. We discussed the risks of the procedure including infection, bleeding, tissue injury, clip migration, and inadequate sampling. Informed written consent was given. The usual time out protocol was performed immediately prior to the procedure. Using sterile technique and 1% Lidocaine as local anesthetic, under stereotactic guidance, a 9 gauge vacuum assisted device was used to perform core needle biopsy of possible right breast distortion using a superior approach. Lesion quadrant: Upper outer right breast At the conclusion of the procedure, a tissue marker clip was deployed into the biopsy cavity. Follow-up 2-view mammogram was performed and dictated separately. IMPRESSION: Stereotactic-guided biopsy of possible right breast distortion. No apparent complications.  Electronically Signed: By: Gerome Sam III M.D. On: 03/12/2023 12:09  MM CLIP PLACEMENT RIGHT  Result Date: 03/12/2023 CLINICAL DATA:  Evaluate biopsy marker EXAM: 3D DIAGNOSTIC RIGHT MAMMOGRAM POST STEREOTACTIC BIOPSY COMPARISON:  Previous exam(s). FINDINGS: 3D Mammographic images were obtained following stereotactic guided biopsy of possible right breast distortion. The biopsy marking clip is in expected position at the site of biopsy. IMPRESSION: Appropriate positioning of the coil shaped biopsy marking clip at the site of biopsy in the location of the biopsied right distortion. Final Assessment: Post Procedure Mammograms for Marker Placement Electronically Signed   By: Gerome Sam III M.D.   On: 03/12/2023 12:14  CT Head Wo Contrast  Result Date: 03/09/2023 CLINICAL DATA:  Headache, increasing frequency or severity. Right eye pain and right-sided headache. EXAM: CT HEAD WITHOUT CONTRAST TECHNIQUE: Contiguous axial images were obtained from the base of the skull through the vertex without intravenous contrast. RADIATION DOSE REDUCTION: This exam was performed according to the departmental dose-optimization program which includes automated exposure control, adjustment of the mA and/or kV according to patient size and/or use of iterative reconstruction technique. COMPARISON:  Head CT 02/07/2023 FINDINGS: Brain: There is no evidence of an acute infarct, intracranial hemorrhage, mass, midline shift, or extra-axial fluid collection. The ventricles and sulci are normal. Vascular: No hyperdense vessel. Skull: No acute fracture or suspicious osseous lesion. Sinuses/Orbits:  Small mucous retention cyst in the right maxillary sinus. Clear mastoid air cells. Unremarkable orbits. Other: None. IMPRESSION: Negative head CT. Electronically Signed   By: Sebastian Ache M.D.   On: 03/09/2023 08:14   MM 3D DIAGNOSTIC MAMMOGRAM BILATERAL BREAST  Result Date: 03/08/2023 CLINICAL DATA:  Screening recall for bilateral  breast asymmetries EXAM: DIGITAL DIAGNOSTIC BILATERAL MAMMOGRAM WITH TOMOSYNTHESIS; ULTRASOUND RIGHT BREAST LIMITED TECHNIQUE: Bilateral digital diagnostic mammography and breast tomosynthesis was performed.; Targeted ultrasound examination of the right breast was performed COMPARISON:  Previous exam(s). ACR Breast Density Category b: There are scattered areas of fibroglandular density. FINDINGS: MAMMOGRAM: Right: Spot tomosynthesis views of the right breast demonstrate an area of subtle architectural distortion in the upper-outer quadrant posterior depth (spot CC 2 of 2 image 28/77, ML image 73/95). This corresponds with the screening mammogram finding. No additional suspicious mass, calcifications, or other findings are identified in the right breast. Left: The previously described possible asymmetries in the left breast do not persist with additional views, consistent with superimposed fibroglandular tissue. These areas appear mammographically stable since at least 2022. No suspicious mass, microcalcification, or other finding is identified in the left breast. ULTRASOUND: Targeted right breast ultrasound in the upper-outer quadrant was performed. No definite sonographic correlate for the architectural distortion in the right breast is identified. Targeted right axillary ultrasound demonstrates a morphologically benign appearing lymph node. IMPRESSION: 1. Right breast subtle architectural distortion upper-outer quadrant, without a sonographic correlate, is low suspicion for malignancy. Recommend further assessment with stereotactic guided biopsy. 2. The previously described possible asymmetries in the left breast do not persist with additional views, consistent with superimposed fibroglandular tissue. No evidence of malignancy in the left breast. RECOMMENDATION: Right breast stereotactic guided biopsy (1 site). I have discussed the findings and recommendations with the patient. If applicable, a reminder letter  will be sent to the patient regarding the next appointment. BI-RADS CATEGORY  4: Suspicious. Electronically Signed   By: Jacob Moores M.D.   On: 03/08/2023 12:34  [place] LIMITED ULTRASOUND INCLUDING AXILLA RIGHT BREAST  Result Date: 03/08/2023 CLINICAL DATA:  Screening recall for bilateral breast asymmetries EXAM: DIGITAL DIAGNOSTIC BILATERAL MAMMOGRAM WITH TOMOSYNTHESIS; ULTRASOUND RIGHT BREAST LIMITED TECHNIQUE: Bilateral digital diagnostic mammography and breast tomosynthesis was performed.; Targeted ultrasound examination of the right breast was performed COMPARISON:  Previous exam(s). ACR Breast Density Category b: There are scattered areas of fibroglandular density. FINDINGS: MAMMOGRAM: Right: Spot tomosynthesis views of the right breast demonstrate an area of subtle architectural distortion in the upper-outer quadrant posterior depth (spot CC 2 of 2 image 28/77, ML image 73/95). This corresponds with the screening mammogram finding. No additional suspicious mass, calcifications, or other findings are identified in the right breast. Left: The previously described possible asymmetries in the left breast do not persist with additional views, consistent with superimposed fibroglandular tissue. These areas appear mammographically stable since at least 2022. No suspicious mass, microcalcification, or other finding is identified in the left breast. ULTRASOUND: Targeted right breast ultrasound in the upper-outer quadrant was performed. No definite sonographic correlate for the architectural distortion in the right breast is identified. Targeted right axillary ultrasound demonstrates a morphologically benign appearing lymph node. IMPRESSION: 1. Right breast subtle architectural distortion upper-outer quadrant, without a sonographic correlate, is low suspicion for malignancy. Recommend further assessment with stereotactic guided biopsy. 2. The previously described possible asymmetries in the left breast do not  persist with additional views, consistent with superimposed fibroglandular tissue. No evidence of malignancy in the left breast. RECOMMENDATION: Right  breast stereotactic guided biopsy (1 site). I have discussed the findings and recommendations with the patient. If applicable, a reminder letter will be sent to the patient regarding the next appointment. BI-RADS CATEGORY  4: Suspicious. Electronically Signed   By: Jacob Moores M.D.   On: 03/08/2023 12:34  MM 3D SCREENING MAMMOGRAM BILATERAL BREAST  Result Date: 03/01/2023 CLINICAL DATA:  Screening. EXAM: DIGITAL SCREENING BILATERAL MAMMOGRAM WITH TOMOSYNTHESIS AND CAD TECHNIQUE: Bilateral screening digital craniocaudal and mediolateral oblique mammograms were obtained. Bilateral screening digital breast tomosynthesis was performed. The images were evaluated with computer-aided detection. COMPARISON:  Left breast mammogram 01/31/2021 ACR Breast Density Category b: There are scattered areas of fibroglandular density. FINDINGS: In the right breast a possible asymmetry requires further evaluation. In the left breast 2 possible asymmetries requires further evaluation. IMPRESSION: Further evaluation is suggested for possible asymmetry in the right breast. Further evaluation is suggested for possible asymmetry in the left breast. RECOMMENDATION: Diagnostic mammogram and possibly ultrasound of both breasts. (Code:[id number]-48M) The patient will be contacted regarding the findings, and additional imaging will be scheduled. BI-RADS CATEGORY  0: Incomplete: Need additional imaging evaluation. Electronically Signed   By: Elberta Fortis M.D.   On: 03/01/2023 07:52    Microbiology: Results for orders placed or performed during the hospital encounter of 03/09/23  Aerobic Culture w Gram Stain (superficial specimen)     Status: None   Collection Time: 03/09/23  1:00 PM   Specimen: Eye; Wound  Result Value Ref Range Status   Specimen Description   Final    EYE Performed at  [facility], [address] [place], [place] [address]    Special Requests   Final    NONE Performed at [facility], [address] [place], [place] [address]    Gram Stain NO WBC SEEN NO ORGANISMS SEEN   Final   Culture   Final    NO GROWTH 3 DAYS Performed at [facility], [address] [place], [place] [address]    Report Status 03/12/2023 FINAL  Final  Culture, Fungus without Smear     Status: None (Preliminary result)   Collection Time: 03/09/23  1:00 PM   Specimen: Eye  Result Value Ref Range Status   Specimen Description EYE  Final   Special Requests RIGHT  Final   Culture   Final    NO FUNGUS ISOLATED AFTER 9 DAYS Performed at [facility], [address] [place], [place] [address]    Report Status PENDING  Incomplete    Labs: CBC: Recent Labs  Lab 03/16/23 1221  WBC 9.8  NEUTROABS 6.8  HGB 14.7  HCT 44.8  MCV 93.9  PLT 337   Basic Metabolic Panel: Recent Labs  Lab 03/16/23 1305  NA 138  K 4.2  CL 103  CO2 27  GLUCOSE 101*  BUN 12  CREATININE 0.72  CALCIUM 8.0*   Liver Function Tests: No results for input(s): "AST", "ALT", "ALKPHOS", "BILITOT", "PROT", "ALBUMIN" in the last 168 hours. CBG: No results for input(s): "GLUCAP" in the last 168 hours.  Discharge time spent: greater than 30 minutes.  Signed: Lurene Shadow, MD [facility] 03/19/2023

## 2023-03-20 ENCOUNTER — Telehealth: Payer: Self-pay

## 2023-03-20 ENCOUNTER — Emergency Department (HOSPITAL_COMMUNITY)
Admission: EM | Admit: 2023-03-20 | Discharge: 2023-03-20 | Disposition: A | Discharge status: Home / Self Care | Payer: Commercial Managed Care - PPO | Attending: Emergency Medicine | Admitting: Emergency Medicine

## 2023-03-20 ENCOUNTER — Encounter (HOSPITAL_COMMUNITY): Payer: Self-pay

## 2023-03-20 ENCOUNTER — Other Ambulatory Visit: Payer: Self-pay

## 2023-03-20 DIAGNOSIS — R519 Headache, unspecified: Secondary | ICD-10-CM | POA: Diagnosis not present

## 2023-03-20 LAB — BASIC METABOLIC PANEL
Anion gap: 14 (ref 5–15)
BUN: 16 mg/dL (ref 6–20)
CO2: 23 mmol/L (ref 22–32)
Calcium: 8.5 mg/dL — ABNORMAL LOW (ref 8.9–10.3)
Chloride: 103 mmol/L (ref 98–111)
Creatinine, Ser: 0.83 mg/dL (ref 0.44–1.00)
GFR, Estimated: >60 mL/min (ref >60)
Glucose, Bld: 92 mg/dL (ref 70–99)
Potassium: 3.8 mmol/L (ref 3.5–5.1)
Sodium: 140 mmol/L (ref 135–145)

## 2023-03-20 LAB — CBC WITH DIFFERENTIAL/PLATELET
Abs Immature Granulocytes: 0.03 10*3/uL (ref 0.00–0.07)
Basophils Absolute: 0.1 10*3/uL (ref 0.0–0.1)
Basophils Relative: 1 %
Eosinophils Absolute: 0.1 10*3/uL (ref 0.0–0.5)
Eosinophils Relative: 1 %
HCT: 44.1 % (ref 36.0–46.0)
Hemoglobin: 14.6 g/dL (ref 12.0–15.0)
Immature Granulocytes: 0 %
Lymphocytes Relative: 37 %
Lymphs Abs: 3.3 10*3/uL (ref 0.7–4.0)
MCH: 31.1 pg (ref 26.0–34.0)
MCHC: 33.1 g/dL (ref 30.0–36.0)
MCV: 94 fL (ref 80.0–100.0)
Monocytes Absolute: 0.5 10*3/uL (ref 0.1–1.0)
Monocytes Relative: 6 %
Neutro Abs: 5 10*3/uL (ref 1.7–7.7)
Neutrophils Relative %: 55 %
Platelets: 325 10*3/uL (ref 150–400)
RBC: 4.69 MIL/uL (ref 3.87–5.11)
RDW: 12.5 % (ref 11.5–15.5)
WBC: 9 10*3/uL (ref 4.0–10.5)
nRBC: 0 % (ref 0.0–0.2)

## 2023-03-20 LAB — CULTURE, FUNGUS WITHOUT SMEAR

## 2023-03-20 MED ORDER — KETOROLAC TROMETHAMINE 15 MG/ML IJ SOLN
15.0000 mg | Freq: Once | INTRAMUSCULAR | Status: AC
  Administered 2023-03-20: 15 mg via INTRAVENOUS
  Filled 2023-03-20: qty 1

## 2023-03-20 MED ORDER — PROCHLORPERAZINE EDISYLATE 10 MG/2ML IJ SOLN
10.0000 mg | Freq: Once | INTRAMUSCULAR | Status: AC
  Administered 2023-03-20: 10 mg via INTRAVENOUS
  Filled 2023-03-20: qty 2

## 2023-03-20 MED ORDER — FLUORESCEIN SODIUM 1 MG OP STRP
1.0000 | ORAL_STRIP | Freq: Once | OPHTHALMIC | Status: AC
  Administered 2023-03-20: 1 via OPHTHALMIC
  Filled 2023-03-20: qty 1

## 2023-03-20 MED ORDER — ONDANSETRON 4 MG PO TBDP
4.0000 mg | ORAL_TABLET | Freq: Once | ORAL | Status: AC
  Administered 2023-03-20: 4 mg via ORAL
  Filled 2023-03-20: qty 1

## 2023-03-20 MED ORDER — OXYCODONE-ACETAMINOPHEN 5-325 MG PO TABS
1.0000 | ORAL_TABLET | Freq: Once | ORAL | Status: AC
  Administered 2023-03-20: 1 via ORAL
  Filled 2023-03-20: qty 1

## 2023-03-20 MED ORDER — LACTATED RINGERS IV BOLUS
1000.0000 mL | Freq: Once | INTRAVENOUS | Status: AC
  Administered 2023-03-20: 1000 mL via INTRAVENOUS

## 2023-03-20 MED ORDER — DIPHENHYDRAMINE HCL 50 MG/ML IJ SOLN
25.0000 mg | Freq: Once | INTRAMUSCULAR | Status: AC
  Administered 2023-03-20: 25 mg via INTRAVENOUS
  Filled 2023-03-20: qty 1

## 2023-03-20 MED ORDER — ONDANSETRON HCL 4 MG/2ML IJ SOLN
INTRAMUSCULAR | Status: AC
  Filled 2023-03-20: qty 2

## 2023-03-20 MED ORDER — FENTANYL CITRATE PF 50 MCG/ML IJ SOSY
50.0000 ug | PREFILLED_SYRINGE | Freq: Once | INTRAMUSCULAR | Status: AC
  Administered 2023-03-20: 50 ug via INTRAVENOUS
  Filled 2023-03-20: qty 1

## 2023-03-20 MED ORDER — ONDANSETRON HCL 4 MG/2ML IJ SOLN
4.0000 mg | Freq: Once | INTRAMUSCULAR | Status: AC
  Administered 2023-03-20: 4 mg via INTRAVENOUS

## 2023-03-20 MED ORDER — TETRACAINE HCL 0.5 % OP SOLN
2.0000 [drp] | Freq: Once | OPHTHALMIC | Status: AC
  Administered 2023-03-20: 2 [drp] via OPHTHALMIC
  Filled 2023-03-20: qty 4

## 2023-03-20 NOTE — Transitions of Care (Post Inpatient/ED Visit) (Signed)
   03/20/2023  Name: Hannah Ballard MRN: [id number] DOB: 1981-04-24  Today's TOC FU Call Status: Today's TOC FU Call Status:: Successful TOC FU Call Competed TOC FU Call Complete Date: 03/20/23  Transition Care Management Follow-up Telephone Call Date of Discharge: 03/19/23 Discharge Facility[facility]) Type of Discharge: Inpatient Admission Primary Inpatient Discharge Diagnosis:: corneal ulcer right eye How have you been since you were released from the hospital?: Same Any questions or concerns?: No  Items Reviewed: Did you receive and understand the discharge instructions provided?: Yes Medications obtained,verified, and reconciled?: Yes (Medications Reviewed) Any new allergies since your discharge?: No Dietary orders reviewed?: Yes Do you have support at home?: Yes People in Home: spouse  Medications Reviewed Today: Medications Reviewed Today     Reviewed by Karena Addison, LPN (Licensed Practical Nurse) on 03/20/23 at 0915  Med List Status: <None>   Medication Order Taking? Sig Documenting Provider Last Dose Status Informant  butalbital-acetaminophen-caffeine (FIORICET) 50-325-40 MG tablet [id number]  Take 1 tablet by mouth every 6 (six) hours as needed for headache. Kem Boroughs B, FNP  Active   cyclopentolate (CYCLODRYL,CYCLOGYL) 1 % ophthalmic solution [id number]  Place 1 drop into the right eye 3 (three) times daily. Lurene Shadow, MD  Active   gatifloxacin (ZYMAXID) 0.5 % SOLN [id number]  Place 1 drop into the right eye every hour. Lurene Shadow, MD  Active   HYDROcodone-acetaminophen (NORCO/VICODIN) 5-325 MG tablet [id number]  Take 1 tablet by mouth every 8 (eight) hours as needed for moderate pain. Lurene Shadow, MD  Active   levothyroxine (SYNTHROID) 150 MCG tablet [id number]  Take 1 tablet (150 mcg total) by mouth daily. [name], NP  Active   pantoprazole (PROTONIX) 40 MG tablet [id number]  Take 1 tablet (40 mg total) by mouth daily.  [name], NP  Active   promethazine (PHENERGAN) 25 MG tablet [id number]  Take 1 tablet (25 mg total) by mouth every 8 (eight) hours as needed for nausea or vomiting. Lurene Shadow, MD  Active   tirzepatide [facility]) 2.5 MG/0.5ML Pen [id number]  Inject 2.5 mg into the skin once a week. [provider]  Active             Home Care and Equipment/Supplies: Were Home Health Services Ordered?: NA Any new equipment or medical supplies ordered?: NA  Functional Questionnaire: Do you need assistance with bathing/showering or dressing?: No Do you need assistance with meal preparation?: No Do you need assistance with eating?: No Do you have difficulty maintaining continence: No Do you need assistance with getting out of bed/getting out of a chair/moving?: No Do you have difficulty managing or taking your medications?: No  Follow up appointments reviewed: PCP Follow-up appointment confirmed?: Yes Date of PCP follow-up appointment?: 03/22/23 Follow-up Provider: [facility] [place] Follow-up appointment confirmed?: Yes Date of Specialist follow-up appointment?: 03/20/23 Follow-Up Specialty Provider:: ophthal Do you need transportation to your follow-up appointment?: No Do you understand care options if your condition(s) worsen?: Yes-patient verbalized understanding    SIGNATURE Karena Addison, LPN [facility] Nurse Health Advisor Direct Dial [phone]

## 2023-03-20 NOTE — ED Triage Notes (Signed)
Pt states she is under treatment for a diagnosed "ulcer" on her right eye. Using anbx drops. Pt states she has continued to worsen, pain is worse, vision is declining.  Today she has had the "worst headache of her life" and she states she has "pus draining from her right ear"  Right eye does appear very irritated and red.

## 2023-03-20 NOTE — Discharge Instructions (Signed)
See your ophthalmologist, Dr. Druscilla Brownie, first thing in the morning.  If you develop fever, new or worsening or uncontrolled pain, weakness or numbness in your extremities, or any other new/concerning symptoms then return to the ER or call 911.

## 2023-03-20 NOTE — ED Provider Triage Note (Signed)
Emergency Medicine Provider Triage Evaluation Note  Hannah Ballard , a 42 y.o. female  was evaluated in triage.  Pt complains of right eye pain.  Patient states that she was recently discharged for corneal ulcer however since discharge the past 2 days she has had increasing pain out of her right eye and decreased vision.  Patient had seen ophthalmologist since discharge.  Patient has been using her antibiotic drops and cyclopentolate eyedrops as prescribed but still endorses symptoms.  Patient now has right-sided headache that is severe in nature and constant.  Patient is related to her right eye.  Patient also notes that when she was in the shower today she had drainage of puslike substance come out of her right ear.  Patient denies chest pain, shortness of breath, fevers  Review of Systems  Positive: See HPI Negative: See HPI  Physical Exam  BP (!) 171/97 (BP Location: Right Arm)   Pulse (!) 104   Temp 98.9 F (37.2 C) (Oral)   Resp 17   SpO2 100%  Gen:   Awake, no distress   Resp:  Normal effort  MSK:   Moves extremities without difficulty  Other:  Right eyes dilated nonreactive to light, decreased vision out of the right eye, tympanic membrane's were without abnormality, patient had good strength in upper and lower extremities, symmetrical smile  Medical Decision Making  Medically screening exam initiated at 8:38 PM.  Appropriate orders placed.  Hannah Ballard was informed that the remainder of the evaluation will be completed by another provider, this initial triage assessment does not replace that evaluation, and the importance of remaining in the ED until their evaluation is complete.  Workup initiated, patient given Zofran and Percocet for her symptoms, I spoke to the nurse about how this patient needs to be moved to the front of the line to be seen in the back due to her concerning symptoms and the triage nurse verbalized her understanding.  Patient stable at this time.   Hannah Ballard, [place] 03/20/23 2041

## 2023-03-20 NOTE — ED Provider Notes (Signed)
[place] EMERGENCY DEPARTMENT AT [facility] Provider Note   CSN: [id number] Arrival date & time: 03/20/23  2016     History  Chief Complaint  Patient presents with   Eye Pain   Ear Drainage    Hannah Ballard is a 42 y.o. female.  HPI 42 year old female presents with headache and pain behind the eye.  This has been ongoing for about 8 days.  She has been dealing with a corneal ulcer and being followed by Dr Druscilla Brownie in [place].  Was on antibiotics drops that were then switched.  She then had to be admitted to the hospital and was discharged yesterday at [facility].  She was dealing with the same headache then and had a significant workup including MRI of her brain and orbits.  Headache has significantly worsened over the past 24 hours.  She has been taking the meds given to her including Fioricet and hydrocodone without relief.   Her eye has been dilated for the past week or so since drops were put in, but over the last 24 hours she feels like her vision has gotten a lot worse and the pain behind her eye is worse.  No fevers or neck stiffness.  No weakness or numbness.  Feels like the eye itself does not hurt but is behind the eye.  Home Medications Prior to Admission medications   Medication Sig Start Date End Date Taking? Authorizing Provider  butalbital-acetaminophen-caffeine (FIORICET) 50-325-40 MG tablet Take 1 tablet by mouth every 6 (six) hours as needed for headache. 03/09/23 03/08/24 Yes Triplett, Cari B, FNP  gatifloxacin (ZYMAXID) 0.5 % SOLN Place 1 drop into the right eye every hour. 03/19/23  Yes Lurene Shadow, MD  HYDROcodone-acetaminophen (NORCO/VICODIN) 5-325 MG tablet Take 1 tablet by mouth every 8 (eight) hours as needed for moderate pain. 03/19/23  Yes Lurene Shadow, MD  levothyroxine (SYNTHROID) 150 MCG tablet Take 1 tablet (150 mcg total) by mouth daily. 02/12/23  Yes [name], NP  LORazepam (ATIVAN) 0.5 MG tablet Take 0.5 mg by mouth 3 (three) times daily  as needed for anxiety.   Yes [provider]  pantoprazole (PROTONIX) 40 MG tablet Take 1 tablet (40 mg total) by mouth daily. 02/08/23  Yes [name], NP  promethazine (PHENERGAN) 25 MG tablet Take 1 tablet (25 mg total) by mouth every 8 (eight) hours as needed for nausea or vomiting. 03/19/23  Yes Lurene Shadow, MD  tirzepatide (ZEPBOUND) 2.5 MG/0.5ML Pen Inject 2.5 mg into the skin once a week.   Yes [provider]  cyclopentolate (CYCLODRYL,CYCLOGYL) 1 % ophthalmic solution Place 1 drop into the right eye 3 (three) times daily. 03/19/23   Lurene Shadow, MD      Allergies    Aspirin and Morphine    Review of Systems   Review of Systems  Constitutional:  Negative for fever.  Eyes:  Positive for pain and visual disturbance.  Gastrointestinal:  Positive for nausea. Negative for vomiting.  Musculoskeletal:  Negative for neck pain.  Neurological:  Positive for headaches. Negative for weakness and numbness.    Physical Exam Updated Vital Signs BP (!) 171/97 (BP Location: Right Arm)   Pulse (!) 104   Temp 98.9 F (37.2 C) (Oral)   Resp 17   SpO2 100%  Physical Exam Vitals and nursing note reviewed.  Constitutional:      Appearance: She is well-developed.  HENT:     Head: Normocephalic and atraumatic.  Cardiovascular:     Rate and  Rhythm: Normal rate and regular rhythm.     Heart sounds: Normal heart sounds.  Pulmonary:     Effort: Pulmonary effort is normal.     Breath sounds: Normal breath sounds.  Abdominal:     Palpations: Abdomen is soft.     Tenderness: There is no abdominal tenderness.  Skin:    General: Skin is warm and dry.  Neurological:     Mental Status: She is alert.     ED Results / Procedures / Treatments   Labs (all labs ordered are listed, but only abnormal results are displayed) Labs Reviewed  BASIC METABOLIC PANEL - Abnormal; Notable for the following components:      Result Value   Calcium 8.5 (*)    All other components  within normal limits  CBC WITH DIFFERENTIAL/PLATELET    EKG None  Radiology No results found.  Procedures Procedures    Medications Ordered in ED Medications  fentaNYL (SUBLIMAZE) injection 50 mcg (has no administration in time range)  tetracaine (PONTOCAINE) 0.5 % ophthalmic solution 2 drop (2 drops Right Eye Given by Other 03/20/23 2120)  fluorescein ophthalmic strip 1 strip (1 strip Right Eye Given by Other 03/20/23 2120)  ondansetron (ZOFRAN-ODT) disintegrating tablet 4 mg (4 mg Oral Given 03/20/23 2119)  oxyCODONE-acetaminophen (PERCOCET/ROXICET) 5-325 MG per tablet 1 tablet (1 tablet Oral Given 03/20/23 2119)  prochlorperazine (COMPAZINE) injection 10 mg (10 mg Intravenous Given 03/20/23 2234)  diphenhydrAMINE (BENADRYL) injection 25 mg (25 mg Intravenous Given 03/20/23 2232)  ketorolac (TORADOL) 15 MG/ML injection 15 mg (15 mg Intravenous Given 03/20/23 2233)  lactated ringers bolus 1,000 mL (1,000 mLs Intravenous New Bag/Given 03/20/23 2232)    ED Course/ Medical Decision Making/ A&P                             Medical Decision Making Amount and/or Complexity of Data Reviewed External Data Reviewed: notes.    Details: [place] discharge summary and consult notes reviewed Labs: ordered.    Details: Normal WBC.  Unremarkable electrolytes.  Risk Prescription drug management.   Discussed with on-call ophthalmology from Dr. Gerome Sam group.  Even with the worsening vision, no indication for ophthalmology to do anything different tonight but will need to be seen in the office tomorrow morning as scheduled.  They need to keep following daily.  Otherwise, this headache seems to be the exact same that was being dealt with over the past several days with a negative workup including negative MRIs.  No meningismus or fever.  Highly doubt CNS infection.  She will be given pain control here but we discussed the importance of following up with ophthalmology and she is stable for  discharge.        Final Clinical Impression(s) / ED Diagnoses Final diagnoses:  Right-sided headache    Rx / DC Orders ED Discharge Orders     None         Pricilla Loveless, MD 03/20/23 2336

## 2023-03-21 ENCOUNTER — Emergency Department: Payer: Commercial Managed Care - PPO

## 2023-03-21 ENCOUNTER — Emergency Department
Admission: EM | Admit: 2023-03-21 | Discharge: 2023-03-21 | Disposition: A | Discharge status: Home / Self Care | Payer: Commercial Managed Care - PPO | Attending: Emergency Medicine | Admitting: Emergency Medicine

## 2023-03-21 ENCOUNTER — Other Ambulatory Visit: Payer: Self-pay

## 2023-03-21 ENCOUNTER — Encounter: Payer: Self-pay | Admitting: Emergency Medicine

## 2023-03-21 DIAGNOSIS — R519 Headache, unspecified: Secondary | ICD-10-CM | POA: Diagnosis not present

## 2023-03-21 DIAGNOSIS — E039 Hypothyroidism, unspecified: Secondary | ICD-10-CM | POA: Insufficient documentation

## 2023-03-21 DIAGNOSIS — Z859 Personal history of malignant neoplasm, unspecified: Secondary | ICD-10-CM | POA: Insufficient documentation

## 2023-03-21 DIAGNOSIS — H16001 Unspecified corneal ulcer, right eye: Secondary | ICD-10-CM | POA: Diagnosis not present

## 2023-03-21 DIAGNOSIS — I10 Essential (primary) hypertension: Secondary | ICD-10-CM | POA: Insufficient documentation

## 2023-03-21 DIAGNOSIS — H5711 Ocular pain, right eye: Secondary | ICD-10-CM | POA: Insufficient documentation

## 2023-03-21 LAB — CBC WITH DIFFERENTIAL/PLATELET
Abs Immature Granulocytes: 0.02 10*3/uL (ref 0.00–0.07)
Basophils Absolute: 0 10*3/uL (ref 0.0–0.1)
Basophils Relative: 0 %
Eosinophils Absolute: 0.1 10*3/uL (ref 0.0–0.5)
Eosinophils Relative: 1 %
HCT: 42.8 % (ref 36.0–46.0)
Hemoglobin: 13.9 g/dL (ref 12.0–15.0)
Immature Granulocytes: 0 %
Lymphocytes Relative: 25 %
Lymphs Abs: 2.3 10*3/uL (ref 0.7–4.0)
MCH: 31 pg (ref 26.0–34.0)
MCHC: 32.5 g/dL (ref 30.0–36.0)
MCV: 95.3 fL (ref 80.0–100.0)
Monocytes Absolute: 0.4 10*3/uL (ref 0.1–1.0)
Monocytes Relative: 5 %
Neutro Abs: 6.2 10*3/uL (ref 1.7–7.7)
Neutrophils Relative %: 69 %
Platelets: 297 10*3/uL (ref 150–400)
RBC: 4.49 MIL/uL (ref 3.87–5.11)
RDW: 12.3 % (ref 11.5–15.5)
WBC: 9.1 10*3/uL (ref 4.0–10.5)
nRBC: 0 % (ref 0.0–0.2)

## 2023-03-21 LAB — COMPREHENSIVE METABOLIC PANEL
ALT: 33 U/L (ref 0–44)
AST: 27 U/L (ref 15–41)
Albumin: 4.4 g/dL (ref 3.5–5.0)
Alkaline Phosphatase: 103 U/L (ref 38–126)
Anion gap: 10 (ref 5–15)
BUN: 15 mg/dL (ref 6–20)
CO2: 23 mmol/L (ref 22–32)
Calcium: 8 mg/dL — ABNORMAL LOW (ref 8.9–10.3)
Chloride: 105 mmol/L (ref 98–111)
Creatinine, Ser: 0.77 mg/dL (ref 0.44–1.00)
GFR, Estimated: >60 mL/min (ref >60)
Glucose, Bld: 89 mg/dL (ref 70–99)
Potassium: 3.7 mmol/L (ref 3.5–5.1)
Sodium: 138 mmol/L (ref 135–145)
Total Bilirubin: 0.6 mg/dL (ref 0.3–1.2)
Total Protein: 7.7 g/dL (ref 6.5–8.1)

## 2023-03-21 MED ORDER — MECLIZINE HCL 25 MG PO TABS: 25.0000 mg | ORAL_TABLET | Freq: Three times a day (TID) | ORAL | 0 refills | Status: DC | PRN

## 2023-03-21 MED ORDER — KETOROLAC TROMETHAMINE 30 MG/ML IJ SOLN
15.0000 mg | Freq: Once | INTRAMUSCULAR | Status: AC
  Administered 2023-03-21: 15 mg via INTRAVENOUS
  Filled 2023-03-21: qty 1

## 2023-03-21 MED ORDER — MECLIZINE HCL 25 MG PO TABS
25.0000 mg | ORAL_TABLET | Freq: Three times a day (TID) | ORAL | 0 refills | Status: DC | PRN
  Filled 2023-03-21: qty 30, 10d supply, fill #0

## 2023-03-21 MED ORDER — DROPERIDOL 2.5 MG/ML IJ SOLN
2.5000 mg | Freq: Once | INTRAMUSCULAR | Status: AC
  Administered 2023-03-21: 2.5 mg via INTRAVENOUS
  Filled 2023-03-21: qty 2

## 2023-03-21 MED ORDER — IOHEXOL 350 MG/ML SOLN
75.0000 mL | Freq: Once | INTRAVENOUS | Status: AC | PRN
  Administered 2023-03-21: 75 mL via INTRAVENOUS

## 2023-03-21 MED ORDER — HYDROCODONE-ACETAMINOPHEN 5-325 MG PO TABS
1.0000 | ORAL_TABLET | Freq: Three times a day (TID) | ORAL | 0 refills | Status: AC
  Filled 2023-03-21: qty 6, 2d supply, fill #0

## 2023-03-21 MED ORDER — HYDROMORPHONE HCL 1 MG/ML IJ SOLN
1.0000 mg | Freq: Once | INTRAMUSCULAR | Status: AC
  Administered 2023-03-21: 1 mg via INTRAVENOUS
  Filled 2023-03-21: qty 1

## 2023-03-21 MED ORDER — METOCLOPRAMIDE HCL 5 MG/ML IJ SOLN
10.0000 mg | Freq: Once | INTRAMUSCULAR | Status: AC
  Administered 2023-03-21: 10 mg via INTRAVENOUS
  Filled 2023-03-21: qty 2

## 2023-03-21 MED ORDER — ONDANSETRON HCL 4 MG/2ML IJ SOLN
4.0000 mg | Freq: Once | INTRAMUSCULAR | Status: AC
  Administered 2023-03-21: 4 mg via INTRAVENOUS
  Filled 2023-03-21: qty 2

## 2023-03-21 MED ORDER — LACTATED RINGERS IV BOLUS
1000.0000 mL | Freq: Once | INTRAVENOUS | Status: AC
  Administered 2023-03-21: 1000 mL via INTRAVENOUS

## 2023-03-21 MED ORDER — MORPHINE SULFATE (PF) 4 MG/ML IV SOLN
4.0000 mg | Freq: Once | INTRAVENOUS | Status: DC
  Filled 2023-03-21: qty 1

## 2023-03-21 NOTE — ED Notes (Signed)
Patient discharged at this time. Ambulated to lobby with independent and steady gait. Breathing unlabored speaking in full sentences. Verbalized understanding of all discharge, follow up, and medication teaching. Discharged homed with all belongings.   

## 2023-03-21 NOTE — ED Provider Notes (Signed)
[facility] Provider Note    Event Date/Time   First MD Initiated Contact with Patient 03/21/23 1748     (approximate)   History   Eye Problem   HPI  Hannah Ballard is a 42 y.o. female past medical history of migraines and recently diagnosed right corneal ulcer who presents because of ongoing right-sided eye and head pain.  8 days ago patient developed pain in her right eye and head and was seen in the ED and diagnosed with a corneal ulcer.  She has been following with ophthalmology.  Did miss several visits with Dr. Druscilla Brownie due to transportation issues but has largely been compliant with her antibiotic drops.  He has had several repeat presentations due to the pain and was actually admitted several days ago from 6/22 to 6/24.  During this admission she did see Dr. Selina Cooley with neurology who felt that this was likely related to the corneal ulcer but recommended getting MRI brain and orbits which did not have any acute pathology.  Patient also had a CT angio on 6/16 that was negative for aneurysms on outside hospital which I can think Care Everywhere.  Patient tells me that she continues to have significant retro-orbital pain that is constant and is not throbbing and vision has actually worsened over the last 48 hours.  Patient also has significant nausea but no vomiting.  She is currently taking hydrocodone but ran out of them.  She tells me she saw Dr. Druscilla Brownie today who told her that the ulcer is healing and her intraocular pressures were normal.  Per patient he says that he the vision change and headache are unlikely to be due to the ulcer.     Past Medical History:  Diagnosis Date   Cancer [facility])    Hypertension    Migraines    Thyroid disease     Patient Active Problem List   Diagnosis Date Noted   Acute right eye pain 03/16/2023   Corneal ulcer of right eye 03/16/2023   Hx of cervical spine surgery 02/19/2023   Acquired hypothyroidism 02/19/2023   Insomnia  02/12/2023   Anxiety and depression 02/12/2023   Throbbing headache 01/27/2023   Vitamin D deficiency 01/26/2023   Headache 03/22/2018   Intractable pain 03/22/2018   HTN (hypertension) 03/22/2018     Physical Exam  Triage Vital Signs: ED Triage Vitals [03/21/23 1356]  Enc Vitals Group     BP 104/81     Pulse Rate 96     Resp 18     Temp 98.6 F (37 C)     Temp Source Oral     SpO2 98 %     Weight      Height      Head Circumference      Peak Flow      Pain Score 8     Pain Loc      Pain Edu?      Excl. in GC?     Most recent vital signs: Vitals:   03/21/23 1915 03/21/23 2230  BP:  109/76  Pulse:  65  Resp: 12 13  Temp:  98.3 F (36.8 C)  SpO2:  95%     General: Awake, no distress.  CV:  Good peripheral perfusion.  Resp:  Normal effort.  Abd:  No distention.  Neuro:             Awake, Alert, Oriented x 3  Other:  R eye is dilated, L  pupil reactive, EOMI, no conjunctival  injection,  Aox3, nml speech  PERRL, EOMI, face symmetric, nml tongue movement  5/5 strength in the BL upper and lower extremities  Sensation grossly intact in the BL upper and lower extremities  Finger-nose-finger intact BL    ED Results / Procedures / Treatments  Labs (all labs ordered are listed, but only abnormal results are displayed) Labs Reviewed  COMPREHENSIVE METABOLIC PANEL - Abnormal; Notable for the following components:      Result Value   Calcium 8.0 (*)    All other components within normal limits  CBC WITH DIFFERENTIAL/PLATELET     EKG  EKG interpretation performed by myself: NSR, nml axis, nml intervals, no acute ischemic changes    RADIOLOGY Reviewed patient's CT venogram which did not have any acute findings   PROCEDURES:  Critical Care performed: No  Procedures  The patient is on the cardiac monitor to evaluate for evidence of arrhythmia and/or significant heart rate changes.   MEDICATIONS ORDERED IN ED: Medications  HYDROmorphone (DILAUDID)  injection 1 mg (1 mg Intravenous Given 03/21/23 1845)  ketorolac (TORADOL) 30 MG/ML injection 15 mg (15 mg Intravenous Given 03/21/23 1844)  metoCLOPramide (REGLAN) injection 10 mg (10 mg Intravenous Given 03/21/23 1845)  iohexol (OMNIPAQUE) 350 MG/ML injection 75 mL (75 mLs Intravenous Contrast Given 03/21/23 1922)  ondansetron (ZOFRAN) injection 4 mg (4 mg Intravenous Given 03/21/23 2049)  droperidol (INAPSINE) 2.5 MG/ML injection 2.5 mg (2.5 mg Intravenous Given 03/21/23 2049)  lactated ringers bolus 1,000 mL (0 mLs Intravenous Stopped 03/21/23 2311)  HYDROmorphone (DILAUDID) injection 1 mg (1 mg Intravenous Given 03/21/23 2109)     IMPRESSION / MDM / ASSESSMENT AND PLAN / ED COURSE  I reviewed the triage vital signs and the nursing notes.                              Patient's presentation is most consistent with acute complicated illness / injury requiring diagnostic workup.  Differential diagnosis includes, but is not limited to, referred pain from corneal ulcer, low suspicion for glaucoma given normal intraocular pressure with ophthalmology today, low suspicion for optic neuritis given recent negative MRI, atypical migraine, cluster headache, low suspicion for IIH or subarachnoid hemorrhage, dural venous sinus fibrosis  Is a 42 year old female who presents with ongoing retro-orbital eye pain and headache.  She was diagnosed with a corneal ulcer over a week ago and was had significant pain behind the right eye since as well as blurred vision.  She has had multiple ED visits and is seeing ophthalmology regularly currently corneal ulcer is improving.  She was just admitted from 6/22 to 6/24 and saw neurology during this admission had MRI brain and orbits which were negative.  Apparently saw ophthalmology today and patient tells me that Dr. Druscilla Brownie told her to come to the ER but APP from triage did speak with Dr. Druscilla Brownie who said that he did not tell her to come to the ER but did feel that her  vision change and headache were not necessarily related to the ulcer.  Patient has significant nausea associated with the pain but is not vomiting.  Of note her eye is still dilated and has been for several days  .  His vital signs are stable.  On exam the right pupil is dilated minimally reactive left is normal she has full extraocular movements and conjunctiva is not injected and her neurologic exam is nonfocal she  has no neck pain.  I reviewed all of patient's recent imaging.  She did have a CTA done at outside hospital 10 days ago that was negative all of her Noncon CT heads up and negative and she did have MRI brain and orbits which were negative.  The only imaging that patient has not had his venogram to evaluate for dural venous sinus thrombosis or cavernous sinus thrombosis which I will order today.  Patient mentions lumbar puncture but ultimately I do not feel that this is indicated.  With the severe unilateral headache that came on with the onset of her corneal ulcer and with just unilateral vision change I do not feel that this is fitting for IIH.  I also do not think she has an infectious process and subarachnoid would be very unusual given the chronicity of the symptoms.  Explained to patient that do not feel that risks of LP are outweighed by the benefits in this case.  Will treat symptomatically and obtain CT venogram.  CT venogram is negative.  Patient was given Dilaudid Toradol and Reglan.  Complained of ongoing nausea so was given Zofran.  Still complaining of nausea and headache so I did order droperidol but asked nursing to obtain EKG before.  EKG shows slightly prolonged QTc at 490 so was given droperidol.  Second EKG was obtained after the droperidol (was done because I thought that the first EKG had not been done) UTC is now more prolonged at 540.  After droperidol and another dose of Dilaudid patient is sleeping.  When I awaken her she says that nausea and headache have significantly  improved.  My suspicion is that the pain is related to the recent corneal ulcer, or the fact that the right eye is still dilated this could be contributing to her nausea.  Given all of the negative imaging my suspicion for an acute life-threatening process is low I do not feel that LP is worth the risk at this point.  I do feel that she should see neurology as an outpatient.  Will refill patient's hydrocodone for just another few days.  She has tried multiple nausea medications at home which have not been very successful.  As I suspect some of patient's nausea is related to the eye being dilated causing a sense of disequilibrium we will try meclizine.  I did tell her that she should not take any more nausea medicine at home given the prolonged QTc.       FINAL CLINICAL IMPRESSION(S) / ED DIAGNOSES   Final diagnoses:  Pain of right eye  Nonintractable headache, unspecified chronicity pattern, unspecified headache type     Rx / DC Orders   ED Discharge Orders          Ordered    meclizine (ANTIVERT) 25 MG tablet  3 times daily PRN,   Status:  Discontinued        03/21/23 2252    meclizine (ANTIVERT) 25 MG tablet  3 times daily PRN        03/21/23 2252    HYDROcodone-acetaminophen (NORCO/VICODIN) 5-325 MG tablet  Every 8 hours        03/21/23 2314             Note:  This document was prepared using Dragon voice recognition software and may include unintentional dictation errors.   Georga Hacking, MD 03/22/23 [phone]

## 2023-03-21 NOTE — ED Notes (Signed)
Pt reports anaphylactic allergy to morphine. Denies same with dilaudid and states has received dilaudid in past without issue. MD notified and states will change medication order.

## 2023-03-21 NOTE — ED Triage Notes (Signed)
Patient to ED via POV from the ophthalmologist for right eye vision worsening with headache and nausea x7 days. States she is seeing blurry spots with shapes and colors. States she has a known ulcer on eye that is still health but eye doctor states that is not the cause of the vision change. Has been seen 4 times in the ED this past week for same with CT scans and MRI's. Ophthalmologist wanting further evaluation. Right eye noted to be more dilated than the left eye.

## 2023-03-21 NOTE — ED Notes (Signed)
Attempted to get lab work at this time. Patient refusing labs until in room to get IV due to having multiple sticks on previous visits.

## 2023-03-21 NOTE — ED Provider Triage Note (Addendum)
Emergency Medicine Provider Triage Evaluation Note  Myrtis Maille , a 42 y.o. female  was evaluated in triage.  Pt complains of vision coming and going, sent by dr Druscilla Brownie to have further workup.  Review of Systems  Positive:  Negative:   Physical Exam  BP 104/81 (BP Location: Left Arm)   Pulse 96   Temp 98.6 F (37 C) (Oral)   Resp 18   SpO2 98%  Gen:   Awake, no distress   Resp:  Normal effort  MSK:   Moves extremities without difficulty  Other:    Medical Decision Making  Medically screening exam initiated at 2:04 PM.  Appropriate orders placed.  Mitsuye Pursifull was informed that the remainder of the evaluation will be completed by another provider, this initial triage assessment does not replace that evaluation, and the importance of remaining in the ED until their evaluation is complete.  Sent porfilio message   Bartholomew Boards 03/21/23 1405  Spoke with Porfilio, states he did not tell her to come to ER, however the visual changes are not related to her eyes and thinks she may need to see a neurologist and have lumbar puncture due to headaches   Faythe Ghee, PA-C 03/21/23 1512

## 2023-03-21 NOTE — Discharge Instructions (Signed)
The CT venogram did not show any clots in your brain.  Try the meclizine to see if it helps with the nausea.  Changed to follow-up with Dr. Druscilla Brownie for exams of your eye.  If you continue to have headaches please follow-up with the neurologist.

## 2023-03-21 NOTE — ED Notes (Signed)
US IV RN at bedside

## 2023-03-22 ENCOUNTER — Inpatient Hospital Stay: Payer: Commercial Managed Care - PPO | Admitting: Nurse Practitioner

## 2023-03-22 ENCOUNTER — Telehealth: Payer: Self-pay

## 2023-03-22 ENCOUNTER — Other Ambulatory Visit: Payer: Self-pay

## 2023-03-22 NOTE — Transitions of Care (Post Inpatient/ED Visit) (Signed)
I spoke with pt and pt has eye infection; pt missed appt C Kaur NP 03/22/23 1 pm due to getting home late from ED; pt cannot see to drive. I asked pt if she would like to reschedule appt and she responded no that she would call and reschedule.UC & ED precautions given and pt voiced understanding.      03/22/2023  Name: Hannah Ballard MRN: [id number] DOB: June 20, 1981  Today's TOC FU Call Status: Today's TOC FU Call Status:: Successful TOC FU Call Competed TOC FU Call Complete Date: 03/22/23  Transition Care Management Follow-up Telephone Call Date of Discharge: 03/21/23 Discharge Facility: [facility]) Type of Discharge: Emergency Department (pt has infection in rt eye) Reason for ED Visit: Other: (eye infection) How have you been since you were released from the hospital?: Worse (missed appt C Kaur NP 03/22/23 1 pm due to getting home late from ED; pt cannot see to drive. I asked pt if she would like to reschedule appt  and she responded no that she woulcall and reschedule.UC & ED precautions given and pt voiced understanding.) Any questions or concerns?: No  Items Reviewed: Did you receive and understand the discharge instructions provided?: Yes Medications obtained,verified, and reconciled?: Yes (Medications Reviewed) (reviewed med list with pt and pt is familiar with how to take her meds.) Any new allergies since your discharge?: No Dietary orders reviewed?: NA Do you have support at home?: Yes People in Home: spouse Name of Support/Comfort Primary Source: Landon  Medications Reviewed Today: Medications Reviewed Today     Reviewed by Asencion Partridge, RN (Registered Nurse) on 03/21/23 at 1822  Med List Status: <None>   Medication Order Taking? Sig Documenting Provider Last Dose Status Informant  butalbital-acetaminophen-caffeine (FIORICET) 50-325-40 MG tablet [id number]  Take 1 tablet by mouth every 6 (six) hours as needed for headache. Kem Boroughs B, FNP   Active Self  cyclopentolate (CYCLODRYL,CYCLOGYL) 1 % ophthalmic solution [id number]  Place 1 drop into the right eye 3 (three) times daily. Lurene Shadow, MD  Active Self  gatifloxacin (ZYMAXID) 0.5 % SOLN [id number]  Place 1 drop into the right eye every hour. Lurene Shadow, MD  Active Self  HYDROcodone-acetaminophen (NORCO/VICODIN) 5-325 MG tablet [id number]  Take 1 tablet by mouth every 8 (eight) hours as needed for moderate pain. Lurene Shadow, MD  Active Self  levothyroxine (SYNTHROID) 150 MCG tablet [id number]  Take 1 tablet (150 mcg total) by mouth daily. [name], NP  Active Self  LORazepam (ATIVAN) 0.5 MG tablet [id number]  Take 0.5 mg by mouth 3 (three) times daily as needed for anxiety. [provider]  Active Self  pantoprazole (PROTONIX) 40 MG tablet [id number]  Take 1 tablet (40 mg total) by mouth daily. [name], NP  Active Self  promethazine (PHENERGAN) 25 MG tablet [id number]  Take 1 tablet (25 mg total) by mouth every 8 (eight) hours as needed for nausea or vomiting. Lurene Shadow, MD  Active Self  tirzepatide [facility]) 2.5 MG/0.5ML Pen [id number]  Inject 2.5 mg into the skin once a week. [provider]  Active Self           Med Note (SATTERFIELD, Genoveva Ill   Tue Mar 20, 2023  9:06 PM) Usually take on Saturdays            Home Care and Equipment/Supplies: Were Home Health Services Ordered?: NA Any new equipment or medical supplies ordered?: NA  Functional Questionnaire: Do you need assistance with bathing/showering or dressing?:  No Do you need assistance with meal preparation?: No Do you need assistance with eating?: No Do you have difficulty maintaining continence: No Do you need assistance with getting out of bed/getting out of a chair/moving?: No Do you have difficulty managing or taking your medications?: No  Follow up appointments reviewed: PCP Follow-up appointment confirmed?: No (missed appt C Kaur NP 03/22/23 1 pm due to  getting home late from ED; pt cannot see to drive. I asked pt if she would like to reschedule appt and she responded no that she woulcall and reschedule.UC & ED precautions given and pt voiced understanding.) MD Provider Line Number:[phone] Given: Yes Date of PCP follow-up appointment?: 03/22/23 Follow-up Provider: [facility] Follow-up appointment confirmed?: NA (pt said if needs to see neurologist pt will call to schedule appt.) Do you need transportation to your follow-up appointment?: No Do you understand care options if your condition(s) worsen?: Yes-patient verbalized understanding    SIGNATURE Lewanda Rife, LPN

## 2023-03-23 DIAGNOSIS — Z79899 Other long term (current) drug therapy: Secondary | ICD-10-CM | POA: Diagnosis not present

## 2023-03-23 DIAGNOSIS — G44309 Post-traumatic headache, unspecified, not intractable: Secondary | ICD-10-CM | POA: Diagnosis not present

## 2023-03-23 DIAGNOSIS — Z79890 Hormone replacement therapy (postmenopausal): Secondary | ICD-10-CM | POA: Diagnosis not present

## 2023-03-23 DIAGNOSIS — Z87891 Personal history of nicotine dependence: Secondary | ICD-10-CM | POA: Diagnosis not present

## 2023-03-23 DIAGNOSIS — I10 Essential (primary) hypertension: Secondary | ICD-10-CM | POA: Diagnosis not present

## 2023-03-23 DIAGNOSIS — Z5941 Food insecurity: Secondary | ICD-10-CM | POA: Diagnosis not present

## 2023-03-23 DIAGNOSIS — S0990XA Unspecified injury of head, initial encounter: Secondary | ICD-10-CM | POA: Diagnosis not present

## 2023-03-23 DIAGNOSIS — E890 Postprocedural hypothyroidism: Secondary | ICD-10-CM | POA: Diagnosis not present

## 2023-03-23 DIAGNOSIS — H5711 Ocular pain, right eye: Secondary | ICD-10-CM | POA: Diagnosis not present

## 2023-03-24 DIAGNOSIS — S0990XA Unspecified injury of head, initial encounter: Secondary | ICD-10-CM | POA: Diagnosis not present

## 2023-03-24 DIAGNOSIS — G44309 Post-traumatic headache, unspecified, not intractable: Secondary | ICD-10-CM | POA: Diagnosis not present

## 2023-03-24 LAB — CULTURE, FUNGUS WITHOUT SMEAR

## 2023-03-25 DIAGNOSIS — R519 Headache, unspecified: Secondary | ICD-10-CM | POA: Diagnosis not present

## 2023-03-25 DIAGNOSIS — H5711 Ocular pain, right eye: Secondary | ICD-10-CM | POA: Diagnosis not present

## 2023-03-25 DIAGNOSIS — E0500 Thyrotoxicosis with diffuse goiter without thyrotoxic crisis or storm: Secondary | ICD-10-CM | POA: Diagnosis not present

## 2023-03-25 DIAGNOSIS — E039 Hypothyroidism, unspecified: Secondary | ICD-10-CM | POA: Diagnosis not present

## 2023-03-25 DIAGNOSIS — G44309 Post-traumatic headache, unspecified, not intractable: Secondary | ICD-10-CM | POA: Diagnosis not present

## 2023-03-25 DIAGNOSIS — I10 Essential (primary) hypertension: Secondary | ICD-10-CM | POA: Diagnosis not present

## 2023-03-25 DIAGNOSIS — Z8669 Personal history of other diseases of the nervous system and sense organs: Secondary | ICD-10-CM | POA: Diagnosis not present

## 2023-03-25 DIAGNOSIS — K219 Gastro-esophageal reflux disease without esophagitis: Secondary | ICD-10-CM | POA: Diagnosis not present

## 2023-03-25 DIAGNOSIS — R110 Nausea: Secondary | ICD-10-CM | POA: Diagnosis not present

## 2023-03-25 DIAGNOSIS — R29810 Facial weakness: Secondary | ICD-10-CM | POA: Diagnosis not present

## 2023-03-25 DIAGNOSIS — M542 Cervicalgia: Secondary | ICD-10-CM | POA: Diagnosis not present

## 2023-03-27 DIAGNOSIS — G4489 Other headache syndrome: Secondary | ICD-10-CM | POA: Diagnosis not present

## 2023-03-27 DIAGNOSIS — Z8639 Personal history of other endocrine, nutritional and metabolic disease: Secondary | ICD-10-CM | POA: Diagnosis not present

## 2023-03-27 DIAGNOSIS — U071 COVID-19: Secondary | ICD-10-CM | POA: Diagnosis not present

## 2023-03-27 DIAGNOSIS — R000 Tachycardia, unspecified: Secondary | ICD-10-CM | POA: Diagnosis not present

## 2023-03-27 DIAGNOSIS — R519 Headache, unspecified: Secondary | ICD-10-CM | POA: Diagnosis not present

## 2023-03-27 DIAGNOSIS — A419 Sepsis, unspecified organism: Secondary | ICD-10-CM | POA: Diagnosis not present

## 2023-03-27 DIAGNOSIS — E039 Hypothyroidism, unspecified: Secondary | ICD-10-CM | POA: Diagnosis not present

## 2023-03-27 DIAGNOSIS — R112 Nausea with vomiting, unspecified: Secondary | ICD-10-CM | POA: Diagnosis not present

## 2023-03-27 DIAGNOSIS — J9811 Atelectasis: Secondary | ICD-10-CM | POA: Diagnosis not present

## 2023-03-28 ENCOUNTER — Other Ambulatory Visit: Payer: Self-pay

## 2023-03-28 DIAGNOSIS — H16001 Unspecified corneal ulcer, right eye: Secondary | ICD-10-CM | POA: Diagnosis not present

## 2023-03-28 DIAGNOSIS — R000 Tachycardia, unspecified: Secondary | ICD-10-CM | POA: Diagnosis not present

## 2023-03-28 MED ORDER — BENZONATATE 200 MG PO CAPS
200.0000 mg | ORAL_CAPSULE | Freq: Three times a day (TID) | ORAL | 0 refills | Status: DC | PRN
  Filled 2023-03-28: qty 20, 7d supply, fill #0

## 2023-03-28 MED ORDER — ACETAMINOPHEN 325 MG PO TABS
975.0000 mg | ORAL_TABLET | Freq: Four times a day (QID) | ORAL | 0 refills | Status: DC
  Filled 2023-03-28: qty 100, 9d supply, fill #0

## 2023-03-28 MED ORDER — PROCHLORPERAZINE 25 MG RE SUPP
25.0000 mg | Freq: Two times a day (BID) | RECTAL | 0 refills | Status: DC | PRN
  Filled 2023-03-28: qty 12, 6d supply, fill #0

## 2023-03-28 MED ORDER — FLUTICASONE PROPIONATE 50 MCG/ACT NA SUSP
NASAL | 0 refills | Status: DC
  Filled 2023-03-28: qty 16, 30d supply, fill #0

## 2023-03-28 MED ORDER — MECLIZINE HCL 25 MG PO TABS
12.5000 mg | ORAL_TABLET | Freq: Three times a day (TID) | ORAL | 0 refills | Status: DC | PRN
  Filled 2023-03-28: qty 15, 10d supply, fill #0

## 2023-03-28 MED ORDER — ONDANSETRON 4 MG PO TBDP
4.0000 mg | ORAL_TABLET | Freq: Three times a day (TID) | ORAL | 0 refills | Status: DC | PRN
  Filled 2023-03-28: qty 20, 7d supply, fill #0

## 2023-03-29 ENCOUNTER — Encounter (HOSPITAL_BASED_OUTPATIENT_CLINIC_OR_DEPARTMENT_OTHER): Payer: Self-pay

## 2023-03-29 ENCOUNTER — Encounter: Payer: Self-pay | Admitting: Nurse Practitioner

## 2023-03-29 ENCOUNTER — Other Ambulatory Visit: Payer: Self-pay

## 2023-03-29 ENCOUNTER — Telehealth: Payer: Commercial Managed Care - PPO | Admitting: Nurse Practitioner

## 2023-03-29 ENCOUNTER — Emergency Department (HOSPITAL_BASED_OUTPATIENT_CLINIC_OR_DEPARTMENT_OTHER)
Admission: EM | Admit: 2023-03-29 | Discharge: 2023-03-30 | Disposition: A | Discharge status: Home / Self Care | Payer: Commercial Managed Care - PPO | Source: Home / Self Care | Attending: Emergency Medicine | Admitting: Emergency Medicine

## 2023-03-29 DIAGNOSIS — R0789 Other chest pain: Secondary | ICD-10-CM | POA: Diagnosis not present

## 2023-03-29 DIAGNOSIS — Z79899 Other long term (current) drug therapy: Secondary | ICD-10-CM | POA: Insufficient documentation

## 2023-03-29 DIAGNOSIS — R112 Nausea with vomiting, unspecified: Secondary | ICD-10-CM | POA: Diagnosis not present

## 2023-03-29 DIAGNOSIS — U071 COVID-19: Secondary | ICD-10-CM | POA: Insufficient documentation

## 2023-03-29 DIAGNOSIS — R110 Nausea: Secondary | ICD-10-CM

## 2023-03-29 DIAGNOSIS — R1115 Cyclical vomiting syndrome unrelated to migraine: Secondary | ICD-10-CM

## 2023-03-29 DIAGNOSIS — E860 Dehydration: Secondary | ICD-10-CM | POA: Insufficient documentation

## 2023-03-29 DIAGNOSIS — R079 Chest pain, unspecified: Secondary | ICD-10-CM | POA: Diagnosis not present

## 2023-03-29 LAB — CBC WITH DIFFERENTIAL/PLATELET
Abs Immature Granulocytes: 0 10*3/uL (ref 0.00–0.07)
Basophils Absolute: 0 10*3/uL (ref 0.0–0.1)
Basophils Relative: 1 %
Eosinophils Absolute: 0.1 10*3/uL (ref 0.0–0.5)
Eosinophils Relative: 2 %
HCT: 39.6 % (ref 36.0–46.0)
Hemoglobin: 13.1 g/dL (ref 12.0–15.0)
Immature Granulocytes: 0 %
Lymphocytes Relative: 34 %
Lymphs Abs: 1.7 10*3/uL (ref 0.7–4.0)
MCH: 31 pg (ref 26.0–34.0)
MCHC: 33.1 g/dL (ref 30.0–36.0)
MCV: 93.8 fL (ref 80.0–100.0)
Monocytes Absolute: 0.6 10*3/uL (ref 0.1–1.0)
Monocytes Relative: 12 %
Neutro Abs: 2.5 10*3/uL (ref 1.7–7.7)
Neutrophils Relative %: 51 %
Platelets: 271 10*3/uL (ref 150–400)
RBC: 4.22 MIL/uL (ref 3.87–5.11)
RDW: 12.9 % (ref 11.5–15.5)
WBC: 4.9 10*3/uL (ref 4.0–10.5)
nRBC: 0 % (ref 0.0–0.2)

## 2023-03-29 LAB — COMPREHENSIVE METABOLIC PANEL
ALT: 24 U/L (ref 0–44)
AST: 15 U/L (ref 15–41)
Albumin: 4.3 g/dL (ref 3.5–5.0)
Alkaline Phosphatase: 77 U/L (ref 38–126)
Anion gap: 10 (ref 5–15)
BUN: 17 mg/dL (ref 6–20)
CO2: 26 mmol/L (ref 22–32)
Calcium: 8.7 mg/dL — ABNORMAL LOW (ref 8.9–10.3)
Chloride: 104 mmol/L (ref 98–111)
Creatinine, Ser: 0.96 mg/dL (ref 0.44–1.00)
GFR, Estimated: >60 mL/min (ref >60)
Glucose, Bld: 90 mg/dL (ref 70–99)
Potassium: 3.4 mmol/L — ABNORMAL LOW (ref 3.5–5.1)
Sodium: 140 mmol/L (ref 135–145)
Total Bilirubin: 0.4 mg/dL (ref 0.3–1.2)
Total Protein: 7.1 g/dL (ref 6.5–8.1)

## 2023-03-29 LAB — LIPASE, BLOOD: Lipase: 17 U/L (ref 11–51)

## 2023-03-29 MED ORDER — PROMETHAZINE HCL 12.5 MG RE SUPP
12.5000 mg | Freq: Four times a day (QID) | RECTAL | Status: AC | PRN
  Administered 2023-03-29: 25 mg via RECTAL
  Filled 2023-03-29: qty 2

## 2023-03-29 MED ORDER — SODIUM CHLORIDE 0.9 % IV BOLUS
1000.0000 mL | Freq: Once | INTRAVENOUS | Status: AC
  Administered 2023-03-29: 1000 mL via INTRAVENOUS

## 2023-03-29 MED ORDER — PROCHLORPERAZINE 25 MG RE SUPP
25.0000 mg | Freq: Two times a day (BID) | RECTAL | 0 refills | Status: DC | PRN
Start: 2023-03-29 — End: 2023-05-18

## 2023-03-29 MED ORDER — SODIUM CHLORIDE 0.9 % IV SOLN: INTRAVENOUS | Status: DC

## 2023-03-29 MED ORDER — ONDANSETRON HCL 4 MG/2ML IJ SOLN
4.0000 mg | Freq: Once | INTRAMUSCULAR | Status: AC
  Administered 2023-03-29: 4 mg via INTRAVENOUS
  Filled 2023-03-29: qty 2

## 2023-03-29 MED ORDER — HYDROMORPHONE HCL 1 MG/ML IJ SOLN
1.0000 mg | Freq: Once | INTRAMUSCULAR | Status: AC
  Administered 2023-03-29: 1 mg via INTRAVENOUS
  Filled 2023-03-29: qty 1

## 2023-03-29 MED ORDER — PROMETHAZINE HCL 25 MG/ML IJ SOLN
INTRAMUSCULAR | Status: AC
  Filled 2023-03-29: qty 1

## 2023-03-29 MED ORDER — SODIUM CHLORIDE 0.9 % IV SOLN
12.5000 mg | Freq: Once | INTRAVENOUS | Status: AC
  Administered 2023-03-29: 12.5 mg via INTRAVENOUS
  Filled 2023-03-29: qty 0.5

## 2023-03-29 NOTE — ED Triage Notes (Signed)
Pt reports being discharged from [facility] yesterday w ulceration of R eye, dx w COVID while in the hospital. States she was sent home w "a bunch of nausea medications." Was able to get zofran, but "unable to get suppository because pharmacy had to be delivered." States she has been unable to keep anything down

## 2023-03-29 NOTE — Progress Notes (Signed)
Virtual Visit Consent   Hannah Ballard, you are scheduled for a virtual visit with a [facility] provider today. Just as with appointments in the office, your consent must be obtained to participate. Your consent will be active for this visit and any virtual visit you may have with one of our providers in the next 365 days. If you have a MyChart account, a copy of this consent can be sent to you electronically.  As this is a virtual visit, video technology does not allow for your provider to perform a traditional examination. This may limit your provider's ability to fully assess your condition. If your provider identifies any concerns that need to be evaluated in person or the need to arrange testing (such as labs, EKG, etc.), we will make arrangements to do so. Although advances in technology are sophisticated, we cannot ensure that it will always work on either your end or our end. If the connection with a video visit is poor, the visit may have to be switched to a telephone visit. With either a video or telephone visit, we are not always able to ensure that we have a secure connection.  By engaging in this virtual visit, you consent to the provision of healthcare and authorize for your insurance to be billed (if applicable) for the services provided during this visit. Depending on your insurance coverage, you may receive a charge related to this service.  I need to obtain your verbal consent now. Are you willing to proceed with your visit today? Hannah Ballard has provided verbal consent on 03/29/2023 for a virtual visit (video or telephone). Hannah Simas, FNP  Date: 03/29/2023 11:04 AM  Virtual Visit via Video Note   I, Hannah Ballard, connected with  Hannah Ballard  ([id number], [birth year]) on 03/29/23 at 11:30 AM EDT by a video-enabled telemedicine application and verified that I am speaking with the correct person using two identifiers.  Location: Patient: Virtual Visit Location Patient: Home Provider:  Virtual Visit Location Provider: Home Office   I discussed the limitations of evaluation and management by telemedicine and the availability of in person appointments. The patient expressed understanding and agreed to proceed.    History of Present Illness: Hannah Ballard is a 42 y.o. 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    Has not urinated since last night  Has not been able to keep fluids down today   She was unable to pick up the compazine at her pharmacy yesterday they were unable to fill it yesterday   Problems:  Patient Active Problem List   Diagnosis Date Noted   Acute right eye pain 03/16/2023   Corneal ulcer of right eye 03/16/2023   Hx of cervical spine surgery 02/19/2023   Acquired hypothyroidism 02/19/2023   Insomnia 02/12/2023   Anxiety and depression 02/12/2023   Throbbing headache 01/27/2023   Vitamin D deficiency 01/26/2023   Headache 03/22/2018   Intractable pain 03/22/2018   HTN (hypertension) 03/22/2018  Allergies:  Allergies  Allergen Reactions   Aspirin Swelling and Anaphylaxis    Tongue swelling Other reaction(s): Edema face/lips/tongue Facial swelling    Morphine Anaphylaxis and Swelling   Medications:  Current Outpatient Medications:    acetaminophen (TYLENOL) 325 MG tablet, Take 3 tablets (975 mg total) by mouth every 6 (six) hours for 10 days., Disp: 100 tablet, Rfl: 0   benzonatate (TESSALON) 200 MG capsule, Take 1 capsule (200 mg total) by mouth 3 (three) times daily as  needed for cough, Disp: 20 capsule, Rfl: 0   butalbital-acetaminophen-caffeine (FIORICET) 50-325-40 MG tablet, Take 1 tablet by mouth every 6 (six) hours as needed for headache., Disp: 12 tablet, Rfl: 0   cyclopentolate (CYCLODRYL,CYCLOGYL) 1 % ophthalmic solution, Place 1 drop into the right eye 3 (three) times daily., Disp: 2 mL, Rfl: 0   fluticasone (FLONASE) 50 MCG/ACT nasal spray, Place 2 sprays into both nostrils once daily as needed for Rhinitis, Disp: 16 g, Rfl: 0   gatifloxacin (ZYMAXID) 0.5 % SOLN, Place 1 drop into the right eye every hour., Disp: , Rfl:    HYDROcodone-acetaminophen (NORCO/VICODIN) 5-325 MG tablet, Take 1 tablet by mouth every 8 (eight) hours as needed for moderate pain., Disp: 10 tablet, Rfl: 0   levothyroxine (SYNTHROID) 150 MCG tablet, Take 1 tablet (150 mcg total) by mouth daily., Disp: 90 tablet, Rfl: 3   LORazepam (ATIVAN) 0.5 MG tablet, Take 0.5 mg by mouth 3 (three) times daily as needed for anxiety., Disp: , Rfl:    meclizine (ANTIVERT) 25 MG tablet, Take 1 tablet (25 mg total) by mouth 3 (three) times daily as needed for dizziness., Disp: 30 tablet, Rfl: 0   meclizine (ANTIVERT) 25 MG tablet, Take 0.5 tablets (12.5 mg total) by mouth every 8 (eight) hours as needed., Disp: 15 tablet, Rfl: 0   ondansetron (ZOFRAN-ODT) 4 MG disintegrating tablet, Take 1 tablet (4 mg total) by mouth every 8 (eight) hours as needed., Disp: 20 tablet, Rfl: 0   pantoprazole (PROTONIX) 40 MG tablet, Take 1 tablet (40 mg total) by mouth daily., Disp: 30 tablet, Rfl: 0   prochlorperazine (COMPAZINE) 25 MG suppository, Place 1 suppository (25 mg total) rectally every 12 (twelve) hours as needed for nausea, Disp: 12 suppository, Rfl: 0   promethazine (PHENERGAN) 25 MG tablet, Take 1 tablet (25 mg total) by mouth every 8 (eight) hours as needed for nausea or vomiting., Disp: 10 tablet, Rfl: 0   tirzepatide (ZEPBOUND) 2.5 MG/0.5ML Pen, Inject 2.5 mg into the skin once a week., Disp: , Rfl:    Observations/Objective: Patient is well-developed, well-nourished in no acute distress.  Resting comfortably  at home.  Head is normocephalic, atraumatic.  No labored breathing.  Speech is clear and coherent with logical content.  Patient is alert and oriented at baseline.    Assessment and Plan: 1. Nausea Medication changed to [facility] today: - prochlorperazine (COMPAZINE) 25 MG suppository; Place 1 suppository (25 mg total) rectally every 12 (twelve) hours as needed for nausea  Dispense: 12 suppository; Refill: 0    Strict follow up precautions discussed with patient  If unable to drink or void in 12 hours span she will need ED follow up for hydration   If compazine controls symptoms but they persists schedule follow up with PCP tomorrow     Follow Up Instructions: I discussed the assessment and treatment plan with the patient. The patient was provided an opportunity to ask questions and all were answered. The patient agreed with  the plan and demonstrated an understanding of the instructions.  A copy of instructions were sent to the patient via MyChart unless otherwise noted below.    The patient was advised to call back or seek an in-person evaluation if the symptoms worsen or if the condition fails to improve as anticipated.  Time:  I spent 15 minutes with the patient via telehealth technology discussing the above problems/concerns.    Hannah Simas, FNP

## 2023-03-29 NOTE — Discharge Instructions (Addendum)
Take your Antivert as you can.  Use your Compazine suppository.  Continue to take the ODT Zofran.  Follow-up with your doctor for further evaluation.  Labs today were very reassuring.

## 2023-03-29 NOTE — ED Notes (Signed)
[facility] contacted to courier phenergan suppositiories

## 2023-03-29 NOTE — ED Provider Notes (Addendum)
[place] EMERGENCY DEPARTMENT AT [facility] Provider Note   CSN: [id number] Arrival date & time: 03/29/23  1753     History  Chief Complaint  Patient presents with   Emesis    Hannah Ballard is a 42 y.o. female.  Patient just discharged from [facility] hospitalization patient was admitted July 2 through July 3.  Patient was discharged with Paxlovid Antivert 12 and half milligrams Zofran ODT and Compazine 25 mg suppository.  Patient states she has been having dry heaves ever since.  Still having trouble with headache.  Patient was diagnosed with COVID while in the hospital.  Patient also had an extensive workup for the headaches to include CTs orbital CTs MRI that was all negative.  Felt patient's symptoms a lot had to do with the COVID.  Patient also had a corneal ulcer in the right eye.  Did have follow-up yesterday with her ophthalmologist and that is healing well.  And he has stopped all of the eyedrop medication.  Patient is concerned that she is dehydrated.  Patient complaining of headache like she did at [facility] and persistent nausea and vomiting.  Patient is had gastric bypass surgery.  No significant abdominal pain.  Patient has not been able to get her Compazine suppository filled because the pharmacy had to order it.  Supposed to be in tomorrow.  She tried to get another 1 but her insurance company would not cover that because 1 was already ordered.  Past history significant for hypertension history of migraines thyroid disease patient had abdominal hysterectomy in 2018 appendectomy in 1997.  Patient is a former smoker quit in 2019.       Home Medications Prior to Admission medications   Medication Sig Start Date End Date Taking? Authorizing Provider  acetaminophen (TYLENOL) 325 MG tablet Take 3 tablets (975 mg total) by mouth every 6 (six) hours for 10 days. 03/28/23     benzonatate (TESSALON) 200 MG capsule Take 1 capsule (200 mg total) by mouth 3 (three) times daily as needed for  cough 03/28/23     butalbital-acetaminophen-caffeine (FIORICET) 50-325-40 MG tablet Take 1 tablet by mouth every 6 (six) hours as needed for headache. 03/09/23 03/08/24  Triplett, Rulon Eisenmenger B, FNP  cyclopentolate (CYCLODRYL,CYCLOGYL) 1 % ophthalmic solution Place 1 drop into the right eye 3 (three) times daily. 03/19/23   Lurene Shadow, MD  fluticasone Aleda Grana) 50 MCG/ACT nasal spray Place 2 sprays into both nostrils once daily as needed for Rhinitis 03/28/23     gatifloxacin (ZYMAXID) 0.5 % SOLN Place 1 drop into the right eye every hour. 03/19/23   Lurene Shadow, MD  HYDROcodone-acetaminophen (NORCO/VICODIN) 5-325 MG tablet Take 1 tablet by mouth every 8 (eight) hours as needed for moderate pain. 03/19/23   Lurene Shadow, MD  levothyroxine (SYNTHROID) 150 MCG tablet Take 1 tablet (150 mcg total) by mouth daily. 02/12/23   [name], NP  LORazepam (ATIVAN) 0.5 MG tablet Take 0.5 mg by mouth 3 (three) times daily as needed for anxiety.    [provider]  meclizine (ANTIVERT) 25 MG tablet Take 1 tablet (25 mg total) by mouth 3 (three) times daily as needed for dizziness. 03/21/23   Georga Hacking, MD  meclizine (ANTIVERT) 25 MG tablet Take 0.5 tablets (12.5 mg total) by mouth every 8 (eight) hours as needed. 03/28/23     ondansetron (ZOFRAN-ODT) 4 MG disintegrating tablet Take 1 tablet (4 mg total) by mouth every 8 (eight) hours as needed. 03/28/23  pantoprazole (PROTONIX) 40 MG tablet Take 1 tablet (40 mg total) by mouth daily. 02/08/23   [name], NP  prochlorperazine (COMPAZINE) 25 MG suppository Place 1 suppository (25 mg total) rectally every 12 (twelve) hours as needed for nausea 03/29/23   Viviano Simas, FNP  promethazine (PHENERGAN) 25 MG tablet Take 1 tablet (25 mg total) by mouth every 8 (eight) hours as needed for nausea or vomiting. 03/19/23   Lurene Shadow, MD  tirzepatide (ZEPBOUND) 2.5 MG/0.5ML Pen Inject 2.5 mg into the skin once a week.    [provider]       Allergies    Aspirin and Morphine    Review of Systems   Review of Systems  Constitutional:  Negative for chills and fever.  HENT:  Negative for ear pain and sore throat.   Eyes:  Negative for pain and visual disturbance.  Respiratory:  Negative for cough and shortness of breath.   Cardiovascular:  Negative for chest pain and palpitations.  Gastrointestinal:  Positive for nausea and vomiting. Negative for abdominal pain.  Genitourinary:  Negative for dysuria and hematuria.  Musculoskeletal:  Negative for arthralgias and back pain.  Skin:  Negative for color change and rash.  Neurological:  Positive for headaches. Negative for seizures and syncope.  All other systems reviewed and are negative.   Physical Exam Updated Vital Signs BP (!) 154/103   Pulse 97   Temp 99.2 F (37.3 C) (Oral)   Resp 20   SpO2 98%  Physical Exam Vitals and nursing note reviewed.  Constitutional:      General: She is not in acute distress.    Appearance: Normal appearance. She is well-developed.  HENT:     Head: Normocephalic and atraumatic.     Mouth/Throat:     Mouth: Mucous membranes are moist.     Comments: Mucous membranes are moist which is very reassuring. Eyes:     Extraocular Movements: Extraocular movements intact.     Conjunctiva/sclera: Conjunctivae normal.     Pupils: Pupils are equal, round, and reactive to light.  Cardiovascular:     Rate and Rhythm: Normal rate and regular rhythm.     Heart sounds: No murmur heard. Pulmonary:     Effort: Pulmonary effort is normal. No respiratory distress.     Breath sounds: Normal breath sounds.  Abdominal:     Palpations: Abdomen is soft.     Tenderness: There is no abdominal tenderness. There is no guarding.  Musculoskeletal:        General: No swelling.     Cervical back: Normal range of motion and neck supple.  Skin:    General: Skin is warm and dry.     Capillary Refill: Capillary refill takes less than 2 seconds.  Neurological:      General: No focal deficit present.     Mental Status: She is alert and oriented to person, place, and time.  Psychiatric:        Mood and Affect: Mood normal.     ED Results / Procedures / Treatments   Labs (all labs ordered are listed, but only abnormal results are displayed) Labs Reviewed  COMPREHENSIVE METABOLIC PANEL - Abnormal; Notable for the following components:      Result Value   Potassium 3.4 (*)    Calcium 8.7 (*)    All other components within normal limits  CBC WITH DIFFERENTIAL/PLATELET  LIPASE, BLOOD    EKG None  Radiology No results found.  Procedures Procedures  Medications Ordered in ED Medications  0.9 %  sodium chloride infusion ( Intravenous New Bag/Given 03/29/23 2154)  promethazine (PHENERGAN) 25 MG/ML injection (has no administration in time range)  sodium chloride 0.9 % bolus 1,000 mL (0 mLs Intravenous Stopped 03/29/23 2143)  ondansetron (ZOFRAN) injection 4 mg (4 mg Intravenous Given 03/29/23 1958)  HYDROmorphone (DILAUDID) injection 1 mg (1 mg Intravenous Given 03/29/23 2143)  promethazine (PHENERGAN) 12.5 mg in sodium chloride 0.9 % 50 mL IVPB (0 mg Intravenous Stopped 03/29/23 2253)    ED Course/ Medical Decision Making/ A&P                             Medical Decision Making Amount and/or Complexity of Data Reviewed Labs: ordered.  Risk Prescription drug management.   Patient's mucous membranes are moist.  Abdomen without any acute findings.  Labs here today CBC white count 4.9 hemoglobin 13.1 platelets 271 complete metabolic panel potassium down a little bit at 3.4 renal functions normal greater than 60 patient's creatinine 0.96 liver function test are normal.  Glucose is 90 lipase is normal at 17.  Patient feeling better with IV fluids feeling better with pain medicine feeling better with IV Zofran and IV Phenergan.  Patient should be stable for discharge home no evidence of any significant electrolyte abnormalities or  significant dehydration and kidney function was normal.  She does have follow-up.  She is able to get her a Compazine suppository later on July 5.  Patient should be stable for discharge home.  Patient also does have the Antivert to take as needed.  Patient's lungs very clear here.  Oxygen saturation very reassuring at 98% on room air.   Final Clinical Impression(s) / ED Diagnoses Final diagnoses:  Persistent recurrent vomiting  COVID    Rx / DC Orders ED Discharge Orders     None         Vanetta Mulders, MD 03/29/23 0981    Vanetta Mulders, MD 03/29/23 [phone]

## 2023-03-30 ENCOUNTER — Other Ambulatory Visit (HOSPITAL_BASED_OUTPATIENT_CLINIC_OR_DEPARTMENT_OTHER): Payer: Self-pay

## 2023-03-30 ENCOUNTER — Encounter (HOSPITAL_BASED_OUTPATIENT_CLINIC_OR_DEPARTMENT_OTHER): Payer: Self-pay | Admitting: *Deleted

## 2023-03-30 ENCOUNTER — Emergency Department (HOSPITAL_BASED_OUTPATIENT_CLINIC_OR_DEPARTMENT_OTHER): Payer: Commercial Managed Care - PPO

## 2023-03-30 ENCOUNTER — Other Ambulatory Visit: Payer: Self-pay

## 2023-03-30 ENCOUNTER — Inpatient Hospital Stay (HOSPITAL_BASED_OUTPATIENT_CLINIC_OR_DEPARTMENT_OTHER)
Admission: EM | Admit: 2023-03-30 | Discharge: 2023-04-09 | DRG: 391 | Disposition: A | Discharge status: Home / Self Care | Payer: Commercial Managed Care - PPO | Attending: Internal Medicine | Admitting: Internal Medicine

## 2023-03-30 ENCOUNTER — Emergency Department (HOSPITAL_BASED_OUTPATIENT_CLINIC_OR_DEPARTMENT_OTHER)
Admission: EM | Admit: 2023-03-30 | Discharge: 2023-03-30 | Discharge status: Absent without leave | Payer: Commercial Managed Care - PPO | Source: Home / Self Care

## 2023-03-30 DIAGNOSIS — K3184 Gastroparesis: Secondary | ICD-10-CM | POA: Diagnosis present

## 2023-03-30 DIAGNOSIS — R079 Chest pain, unspecified: Secondary | ICD-10-CM | POA: Diagnosis not present

## 2023-03-30 DIAGNOSIS — L03113 Cellulitis of right upper limb: Secondary | ICD-10-CM | POA: Diagnosis not present

## 2023-03-30 DIAGNOSIS — Z6834 Body mass index (BMI) 34.0-34.9, adult: Secondary | ICD-10-CM

## 2023-03-30 DIAGNOSIS — Z9884 Bariatric surgery status: Secondary | ICD-10-CM

## 2023-03-30 DIAGNOSIS — Z9049 Acquired absence of other specified parts of digestive tract: Secondary | ICD-10-CM | POA: Diagnosis not present

## 2023-03-30 DIAGNOSIS — R0789 Other chest pain: Secondary | ICD-10-CM | POA: Diagnosis not present

## 2023-03-30 DIAGNOSIS — E039 Hypothyroidism, unspecified: Secondary | ICD-10-CM | POA: Diagnosis present

## 2023-03-30 DIAGNOSIS — Z886 Allergy status to analgesic agent status: Secondary | ICD-10-CM | POA: Diagnosis not present

## 2023-03-30 DIAGNOSIS — I10 Essential (primary) hypertension: Secondary | ICD-10-CM | POA: Diagnosis not present

## 2023-03-30 DIAGNOSIS — H16001 Unspecified corneal ulcer, right eye: Secondary | ICD-10-CM | POA: Diagnosis present

## 2023-03-30 DIAGNOSIS — E669 Obesity, unspecified: Secondary | ICD-10-CM | POA: Diagnosis not present

## 2023-03-30 DIAGNOSIS — Z885 Allergy status to narcotic agent status: Secondary | ICD-10-CM

## 2023-03-30 DIAGNOSIS — R110 Nausea: Secondary | ICD-10-CM | POA: Diagnosis not present

## 2023-03-30 DIAGNOSIS — K5900 Constipation, unspecified: Secondary | ICD-10-CM | POA: Diagnosis not present

## 2023-03-30 DIAGNOSIS — T383X5A Adverse effect of insulin and oral hypoglycemic [antidiabetic] drugs, initial encounter: Secondary | ICD-10-CM | POA: Diagnosis present

## 2023-03-30 DIAGNOSIS — Z90710 Acquired absence of both cervix and uterus: Secondary | ICD-10-CM

## 2023-03-30 DIAGNOSIS — R112 Nausea with vomiting, unspecified: Secondary | ICD-10-CM | POA: Diagnosis not present

## 2023-03-30 DIAGNOSIS — E860 Dehydration: Secondary | ICD-10-CM | POA: Diagnosis not present

## 2023-03-30 DIAGNOSIS — Z79890 Hormone replacement therapy (postmenopausal): Secondary | ICD-10-CM | POA: Diagnosis not present

## 2023-03-30 DIAGNOSIS — G43909 Migraine, unspecified, not intractable, without status migrainosus: Secondary | ICD-10-CM | POA: Diagnosis not present

## 2023-03-30 DIAGNOSIS — I82612 Acute embolism and thrombosis of superficial veins of left upper extremity: Secondary | ICD-10-CM | POA: Diagnosis not present

## 2023-03-30 DIAGNOSIS — K316 Fistula of stomach and duodenum: Secondary | ICD-10-CM | POA: Diagnosis present

## 2023-03-30 DIAGNOSIS — R519 Headache, unspecified: Secondary | ICD-10-CM | POA: Diagnosis present

## 2023-03-30 DIAGNOSIS — K224 Dyskinesia of esophagus: Secondary | ICD-10-CM | POA: Diagnosis present

## 2023-03-30 DIAGNOSIS — R1110 Vomiting, unspecified: Secondary | ICD-10-CM

## 2023-03-30 DIAGNOSIS — U071 COVID-19: Secondary | ICD-10-CM | POA: Diagnosis not present

## 2023-03-30 DIAGNOSIS — H5711 Ocular pain, right eye: Secondary | ICD-10-CM | POA: Diagnosis present

## 2023-03-30 DIAGNOSIS — Z87891 Personal history of nicotine dependence: Secondary | ICD-10-CM

## 2023-03-30 DIAGNOSIS — J329 Chronic sinusitis, unspecified: Secondary | ICD-10-CM | POA: Diagnosis not present

## 2023-03-30 LAB — LIPASE, BLOOD: Lipase: 18 U/L (ref 11–51)

## 2023-03-30 LAB — CBC WITH DIFFERENTIAL/PLATELET
Abs Immature Granulocytes: 0.01 10*3/uL (ref 0.00–0.07)
Basophils Absolute: 0 10*3/uL (ref 0.0–0.1)
Basophils Relative: 1 %
Eosinophils Absolute: 0.1 10*3/uL (ref 0.0–0.5)
Eosinophils Relative: 2 %
HCT: 40.8 % (ref 36.0–46.0)
Hemoglobin: 13.7 g/dL (ref 12.0–15.0)
Immature Granulocytes: 0 %
Lymphocytes Relative: 30 %
Lymphs Abs: 1.7 10*3/uL (ref 0.7–4.0)
MCH: 31.6 pg (ref 26.0–34.0)
MCHC: 33.6 g/dL (ref 30.0–36.0)
MCV: 94 fL (ref 80.0–100.0)
Monocytes Absolute: 0.4 10*3/uL (ref 0.1–1.0)
Monocytes Relative: 7 %
Neutro Abs: 3.3 10*3/uL (ref 1.7–7.7)
Neutrophils Relative %: 60 %
Platelets: 283 10*3/uL (ref 150–400)
RBC: 4.34 MIL/uL (ref 3.87–5.11)
RDW: 12.8 % (ref 11.5–15.5)
WBC: 5.6 10*3/uL (ref 4.0–10.5)
nRBC: 0 % (ref 0.0–0.2)

## 2023-03-30 LAB — CBC
HCT: 41.1 % (ref 36.0–46.0)
Hemoglobin: 13.1 g/dL (ref 12.0–15.0)
MCH: 31.2 pg (ref 26.0–34.0)
MCHC: 31.9 g/dL (ref 30.0–36.0)
MCV: 97.9 fL (ref 80.0–100.0)
Platelets: 248 10*3/uL (ref 150–400)
RBC: 4.2 MIL/uL (ref 3.87–5.11)
RDW: 12.5 % (ref 11.5–15.5)
WBC: 5.2 10*3/uL (ref 4.0–10.5)
nRBC: 0 % (ref 0.0–0.2)

## 2023-03-30 LAB — PREGNANCY, URINE: Preg Test, Ur: NEGATIVE

## 2023-03-30 LAB — COMPREHENSIVE METABOLIC PANEL
ALT: 24 U/L (ref 0–44)
AST: 17 U/L (ref 15–41)
Albumin: 4.6 g/dL (ref 3.5–5.0)
Alkaline Phosphatase: 79 U/L (ref 38–126)
Anion gap: 12 (ref 5–15)
BUN: 17 mg/dL (ref 6–20)
CO2: 23 mmol/L (ref 22–32)
Calcium: 8.6 mg/dL — ABNORMAL LOW (ref 8.9–10.3)
Chloride: 105 mmol/L (ref 98–111)
Creatinine, Ser: 0.8 mg/dL (ref 0.44–1.00)
GFR, Estimated: >60 mL/min (ref >60)
Glucose, Bld: 92 mg/dL (ref 70–99)
Potassium: 3.6 mmol/L (ref 3.5–5.1)
Sodium: 140 mmol/L (ref 135–145)
Total Bilirubin: 0.5 mg/dL (ref 0.3–1.2)
Total Protein: 7.6 g/dL (ref 6.5–8.1)

## 2023-03-30 LAB — RAPID URINE DRUG SCREEN, HOSP PERFORMED
Amphetamines: NOT DETECTED
Barbiturates: NOT DETECTED
Benzodiazepines: NOT DETECTED
Cocaine: NOT DETECTED
Opiates: NOT DETECTED
Tetrahydrocannabinol: NOT DETECTED

## 2023-03-30 LAB — CREATININE, SERUM
Creatinine, Ser: 0.73 mg/dL (ref 0.44–1.00)
GFR, Estimated: >60 mL/min (ref >60)

## 2023-03-30 LAB — TROPONIN I (HIGH SENSITIVITY): Troponin I (High Sensitivity): <2 ng/L (ref <18)

## 2023-03-30 MED ORDER — ONDANSETRON 4 MG PO TBDP
4.0000 mg | ORAL_TABLET | Freq: Once | ORAL | Status: AC
  Administered 2023-03-30: 4 mg via ORAL
  Filled 2023-03-30: qty 1

## 2023-03-30 MED ORDER — HYDROMORPHONE HCL 1 MG/ML IJ SOLN
1.0000 mg | Freq: Once | INTRAMUSCULAR | Status: AC
  Administered 2023-03-30: 1 mg via INTRAVENOUS
  Filled 2023-03-30: qty 1

## 2023-03-30 MED ORDER — SODIUM CHLORIDE 0.9% FLUSH
3.0000 mL | Freq: Two times a day (BID) | INTRAVENOUS | Status: DC
  Administered 2023-03-30 – 2023-04-09 (×15): 3 mL via INTRAVENOUS

## 2023-03-30 MED ORDER — ALUM & MAG HYDROXIDE-SIMETH 200-200-20 MG/5ML PO SUSP
30.0000 mL | Freq: Once | ORAL | Status: AC
  Administered 2023-03-30: 30 mL via ORAL
  Filled 2023-03-30: qty 30

## 2023-03-30 MED ORDER — SODIUM CHLORIDE 0.9 % IV SOLN
100.0000 mg | Freq: Every day | INTRAVENOUS | Status: DC
  Administered 2023-03-31: 100 mg via INTRAVENOUS
  Filled 2023-03-30: qty 20

## 2023-03-30 MED ORDER — MECLIZINE HCL 25 MG PO TABS
25.0000 mg | ORAL_TABLET | Freq: Once | ORAL | Status: AC
  Administered 2023-03-30: 25 mg via ORAL
  Filled 2023-03-30: qty 1

## 2023-03-30 MED ORDER — SODIUM CHLORIDE 0.9 % IV BOLUS
1000.0000 mL | Freq: Once | INTRAVENOUS | Status: AC
  Administered 2023-03-30: 1000 mL via INTRAVENOUS

## 2023-03-30 MED ORDER — HYDROMORPHONE HCL 1 MG/ML IJ SOLN
0.5000 mg | Freq: Once | INTRAMUSCULAR | Status: AC
  Administered 2023-03-30: 0.5 mg via INTRAVENOUS
  Filled 2023-03-30: qty 1

## 2023-03-30 MED ORDER — DEXTROSE IN LACTATED RINGERS 5 % IV SOLN: INTRAVENOUS | Status: DC

## 2023-03-30 MED ORDER — MELATONIN 5 MG PO TABS
5.0000 mg | ORAL_TABLET | Freq: Every evening | ORAL | Status: AC | PRN
  Administered 2023-03-30: 5 mg via ORAL
  Filled 2023-03-30: qty 1

## 2023-03-30 MED ORDER — SODIUM CHLORIDE 0.9 % IV SOLN
200.0000 mg | Freq: Once | INTRAVENOUS | Status: AC
  Administered 2023-03-30: 200 mg via INTRAVENOUS
  Filled 2023-03-30: qty 40

## 2023-03-30 MED ORDER — PROMETHAZINE HCL 25 MG/ML IJ SOLN
INTRAMUSCULAR | Status: AC
  Filled 2023-03-30: qty 1

## 2023-03-30 MED ORDER — SODIUM CHLORIDE 0.9 % IV SOLN
25.0000 mg | Freq: Once | INTRAVENOUS | Status: AC
  Administered 2023-03-30: 25 mg via INTRAVENOUS
  Filled 2023-03-30: qty 1

## 2023-03-30 MED ORDER — ONDANSETRON HCL 4 MG/2ML IJ SOLN
4.0000 mg | Freq: Four times a day (QID) | INTRAMUSCULAR | Status: DC | PRN
  Administered 2023-03-30 – 2023-03-31 (×3): 4 mg via INTRAVENOUS
  Filled 2023-03-30 (×3): qty 2

## 2023-03-30 MED ORDER — METOCLOPRAMIDE HCL 5 MG/ML IJ SOLN
5.0000 mg | Freq: Once | INTRAMUSCULAR | Status: AC
  Administered 2023-03-30: 5 mg via INTRAVENOUS
  Filled 2023-03-30: qty 2

## 2023-03-30 MED ORDER — ACETAMINOPHEN 325 MG PO TABS
650.0000 mg | ORAL_TABLET | Freq: Four times a day (QID) | ORAL | Status: DC | PRN
  Administered 2023-04-09: 650 mg via ORAL
  Filled 2023-03-30: qty 2

## 2023-03-30 MED ORDER — ONDANSETRON HCL 4 MG PO TABS: 4.0000 mg | ORAL_TABLET | ORAL | Status: DC | PRN

## 2023-03-30 MED ORDER — POLYETHYLENE GLYCOL 3350 17 G PO PACK
17.0000 g | PACK | Freq: Every day | ORAL | Status: DC | PRN
  Administered 2023-04-03: 17 g via ORAL
  Filled 2023-03-30: qty 1

## 2023-03-30 MED ORDER — ONDANSETRON HCL 4 MG/2ML IJ SOLN
4.0000 mg | Freq: Once | INTRAMUSCULAR | Status: AC
  Administered 2023-03-30: 4 mg via INTRAVENOUS
  Filled 2023-03-30: qty 2

## 2023-03-30 MED ORDER — DIPHENHYDRAMINE HCL 50 MG/ML IJ SOLN
25.0000 mg | Freq: Once | INTRAMUSCULAR | Status: AC
  Administered 2023-03-30: 25 mg via INTRAVENOUS
  Filled 2023-03-30: qty 1

## 2023-03-30 MED ORDER — LIDOCAINE VISCOUS HCL 2 % MT SOLN
15.0000 mL | Freq: Once | OROMUCOSAL | Status: AC
  Administered 2023-03-30: 15 mL via ORAL
  Filled 2023-03-30: qty 15

## 2023-03-30 MED ORDER — ENOXAPARIN SODIUM 40 MG/0.4ML IJ SOSY
40.0000 mg | PREFILLED_SYRINGE | INTRAMUSCULAR | Status: DC
  Administered 2023-03-31 – 2023-04-05 (×6): 40 mg via SUBCUTANEOUS
  Filled 2023-03-30 (×6): qty 0.4

## 2023-03-30 MED ORDER — PROCHLORPERAZINE EDISYLATE 10 MG/2ML IJ SOLN
5.0000 mg | Freq: Four times a day (QID) | INTRAMUSCULAR | Status: AC | PRN
  Administered 2023-03-30 – 2023-03-31 (×2): 5 mg via INTRAVENOUS
  Filled 2023-03-30 (×2): qty 2

## 2023-03-30 MED ORDER — ONDANSETRON HCL 4 MG/2ML IJ SOLN: 4.0000 mg | Freq: Once | INTRAMUSCULAR | Status: DC

## 2023-03-30 MED ORDER — HYDROMORPHONE HCL 1 MG/ML IJ SOLN
0.5000 mg | INTRAMUSCULAR | Status: DC | PRN
  Administered 2023-03-30 – 2023-04-08 (×55): 0.5 mg via INTRAVENOUS
  Filled 2023-03-30 (×55): qty 0.5

## 2023-03-30 MED ORDER — POTASSIUM CHLORIDE 10 MEQ/100ML IV SOLN
10.0000 meq | INTRAVENOUS | Status: AC
  Administered 2023-03-30 (×2): 10 meq via INTRAVENOUS
  Filled 2023-03-30 (×2): qty 100

## 2023-03-30 MED ORDER — LEVOTHYROXINE SODIUM 100 MCG/5ML IV SOLN: 112.0000 ug | Freq: Every day | INTRAVENOUS | Status: DC

## 2023-03-30 MED ORDER — PANTOPRAZOLE SODIUM 40 MG IV SOLR
40.0000 mg | Freq: Two times a day (BID) | INTRAVENOUS | Status: AC
  Administered 2023-03-30 – 2023-04-07 (×16): 40 mg via INTRAVENOUS
  Filled 2023-03-30 (×16): qty 10

## 2023-03-30 MED ORDER — ACETAMINOPHEN 650 MG RE SUPP
650.0000 mg | Freq: Four times a day (QID) | RECTAL | Status: DC | PRN
  Administered 2023-03-31 – 2023-04-02 (×2): 650 mg via RECTAL
  Filled 2023-03-30 (×4): qty 1

## 2023-03-30 NOTE — Assessment & Plan Note (Signed)
Diagnosed on March 27, 2023 at [facility].  Initially treated with remdesivir, unfortunately patient did not tolerate Paxlovid.  Initially placed on Remdesivir here, but due to lack of respiratory sx's, this was discontinued. ? Is this causing her to have increased N/V

## 2023-03-30 NOTE — ED Triage Notes (Signed)
After being discharged last night, pt had a small cake and a drink from [place]; started feeling nauseous and vomiting with cp and sob. Has taked PO zofran x2 and phenergan suppository without improvement of nausea. Covid + 2 days ago. C/o chest heaviness

## 2023-03-30 NOTE — Assessment & Plan Note (Addendum)
Has marked pain from her right corneal ulcer/scarring. Seems to have impacted severe headache with emesis. Will work to control her emesis. Per report this has been managed by ophthalmology as an outpatient.  However patient is having residual pain, patient had good response to IV Dilaudid in the ER, continue same.  Acetaminophen ordered.  Patient reports that she has been advised not to take NSAIDs because of her gastric bypass surgery.  I plan to transition patient to oral pain medication at the same time that we start oral diet.

## 2023-03-30 NOTE — Assessment & Plan Note (Signed)
Unclear etiology at this time, could be related to COVID-19 versus severe pain.  Patient also reports being started on [facility] about a week ago that has since then been stopped.  Pain medications as ordered below.  Remdesivir treatment started.  For the vomiting we will give Zofran as needed.  Empiric pantoprazole treatment.  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  Reviewed with Gen surg and they note this is not an acute cause of N/V and that outpt. Referral back to bypass surgeon is appropriate. Will change to scheduled Zofran and add scopolamine patch.

## 2023-03-30 NOTE — ED Provider Notes (Signed)
[place] EMERGENCY DEPARTMENT AT [facility] Provider Note  CSN: [id number] Arrival date & time: 03/30/23 4098  Chief Complaint(s) Chest Pain  HPI Hannah Ballard is a 42 y.o. female with past medical history as below, significant for htn, migraines, corneal ulcer right eye, covid 19 dx 7/3 who presents to the ED with complaint of nausea/vomiting/dry heaves/chest pain. She was seen here last night with similar complaint, she felt better at time of discharge and on the way home she stopped at gas station for something to eat. Shortly after eating food from gas station began to have stomach spasming sensation and began to vomit and later dry heaves. Following this her headache returned that she had prior to her ED visit last night. She took her home nausea medication overnight with minimal improvement. Last dose of zofran around 0600, reports ongoing nausea now but her primary concern is chest pressure and dib that began after dry heaves/emesis. No dizziness or light headed sensation, no light sensitivity or sound sensitivity. No current headache. No numbness or tingling to extremities. Was dx covid 19 two days ago per pt.   Pt also with anisocoria; last saw ophthalmologist 7/3 per pt, she had her right eye dilated   Past Medical History Past Medical History:  Diagnosis Date   Cancer (HCC)    Hypertension    Migraines    Thyroid disease    Patient Active Problem List   Diagnosis Date Noted   Nausea 03/30/2023   Acute right eye pain 03/16/2023   Corneal ulcer of right eye 03/16/2023   Hx of cervical spine surgery 02/19/2023   Acquired hypothyroidism 02/19/2023   Insomnia 02/12/2023   Anxiety and depression 02/12/2023   Throbbing headache 01/27/2023   Vitamin D deficiency 01/26/2023   Headache 03/22/2018   Intractable pain 03/22/2018   HTN (hypertension) 03/22/2018   Home Medication(s) Prior to Admission medications   Medication Sig Start Date End Date Taking? Authorizing  Provider  acetaminophen (TYLENOL) 325 MG tablet Take 3 tablets (975 mg total) by mouth every 6 (six) hours for 10 days. 03/28/23     benzonatate (TESSALON) 200 MG capsule Take 1 capsule (200 mg total) by mouth 3 (three) times daily as needed for cough 03/28/23     butalbital-acetaminophen-caffeine (FIORICET) 50-325-40 MG tablet Take 1 tablet by mouth every 6 (six) hours as needed for headache. 03/09/23 03/08/24  Triplett, Rulon Eisenmenger B, FNP  cyclopentolate (CYCLODRYL,CYCLOGYL) 1 % ophthalmic solution Place 1 drop into the right eye 3 (three) times daily. 03/19/23   Lurene Shadow, MD  fluticasone Aleda Grana) 50 MCG/ACT nasal spray Place 2 sprays into both nostrils once daily as needed for Rhinitis 03/28/23     gatifloxacin (ZYMAXID) 0.5 % SOLN Place 1 drop into the right eye every hour. 03/19/23   Lurene Shadow, MD  HYDROcodone-acetaminophen (NORCO/VICODIN) 5-325 MG tablet Take 1 tablet by mouth every 8 (eight) hours as needed for moderate pain. 03/19/23   Lurene Shadow, MD  levothyroxine (SYNTHROID) 150 MCG tablet Take 1 tablet (150 mcg total) by mouth daily. 02/12/23   [name], NP  LORazepam (ATIVAN) 0.5 MG tablet Take 0.5 mg by mouth 3 (three) times daily as needed for anxiety.    [provider]  meclizine (ANTIVERT) 25 MG tablet Take 1 tablet (25 mg total) by mouth 3 (three) times daily as needed for dizziness. 03/21/23   Georga Hacking, MD  meclizine (ANTIVERT) 25 MG tablet Take 0.5 tablets (12.5 mg total) by mouth every 8 (eight) hours  as needed. 03/28/23     ondansetron (ZOFRAN-ODT) 4 MG disintegrating tablet Take 1 tablet (4 mg total) by mouth every 8 (eight) hours as needed. 03/28/23     pantoprazole (PROTONIX) 40 MG tablet Take 1 tablet (40 mg total) by mouth daily. 02/08/23   [name], NP  prochlorperazine (COMPAZINE) 25 MG suppository Place 1 suppository (25 mg total) rectally every 12 (twelve) hours as needed for nausea 03/29/23   Viviano Simas, FNP  promethazine (PHENERGAN) 25 MG  tablet Take 1 tablet (25 mg total) by mouth every 8 (eight) hours as needed for nausea or vomiting. 03/19/23   Lurene Shadow, MD  tirzepatide (ZEPBOUND) 2.5 MG/0.5ML Pen Inject 2.5 mg into the skin once a week.    [provider]                                                                                                                                    Past Surgical History Past Surgical History:  Procedure Laterality Date   ABDOMINAL HYSTERECTOMY  2018   ANKLE SURGERY Left    has hardware   ANTERIOR FUSION CERVICAL SPINE  2008   APPENDECTOMY  1997   BREAST BIOPSY     BREAST BIOPSY Right 03/12/2023   MM RT BREAST BX W LOC DEV 1ST LESION IMAGE BX SPEC STEREO GUIDE 03/12/2023 [facility]   CHOLECYSTECTOMY     FRACTURE SURGERY     SHOULDER SURGERY Left    has pins   SPINE SURGERY     Family History Family History  Problem Relation Age of Onset   Healthy Mother    Healthy Father     Social History Social History   Tobacco Use   Smoking status: Former    Types: Cigarettes    Quit date: 2019    Years since quitting: 5.5   Smokeless tobacco: Never  Vaping Use   Vaping Use: Never used  Substance Use Topics   Alcohol use: Never   Drug use: Never   Allergies Aspirin and Morphine  Review of Systems Review of Systems  Constitutional:  Negative for activity change and fever.  HENT:  Negative for facial swelling and trouble swallowing.   Eyes:  Negative for discharge and redness.  Respiratory:  Positive for chest tightness and shortness of breath. Negative for cough.   Cardiovascular:  Positive for chest pain. Negative for palpitations.  Gastrointestinal:  Positive for abdominal pain, nausea and vomiting. Negative for diarrhea.  Genitourinary:  Negative for dysuria and flank pain.  Musculoskeletal:  Negative for back pain and gait problem.  Skin:  Negative for pallor and rash.  Neurological:  Negative for syncope and headaches.    Physical Exam Vital  Signs  I have reviewed the triage vital signs BP (!) 146/90   Pulse 82   Temp 98.4 F (36.9 C) (Oral)   Resp (!) 23   SpO2 99%  Physical Exam  Vitals and nursing note reviewed.  Constitutional:      General: She is not in acute distress.    Appearance: Normal appearance. She is well-developed.  HENT:     Head: Normocephalic and atraumatic.     Right Ear: External ear normal.     Left Ear: External ear normal.     Nose: Nose normal.     Mouth/Throat:     Mouth: Mucous membranes are moist.  Eyes:     General: No scleral icterus.       Right eye: No discharge.        Left eye: No discharge.     Comments: Anisocoria R>L   Cardiovascular:     Rate and Rhythm: Normal rate and regular rhythm.     Pulses: Normal pulses.     Heart sounds: Normal heart sounds. No murmur heard. Pulmonary:     Effort: Pulmonary effort is normal. No respiratory distress.     Breath sounds: Normal breath sounds. No stridor. No wheezing.  Abdominal:     General: Abdomen is flat.     Palpations: Abdomen is soft.     Tenderness: There is no abdominal tenderness. There is no guarding or rebound.  Musculoskeletal:        General: Normal range of motion.     Cervical back: No rigidity.     Right lower leg: No edema.     Left lower leg: No edema.  Skin:    General: Skin is warm and dry.     Capillary Refill: Capillary refill takes less than 2 seconds.  Neurological:     Mental Status: She is alert and oriented to person, place, and time.     GCS: GCS eye subscore is 4. GCS verbal subscore is 5. GCS motor subscore is 6.     Cranial Nerves: No dysarthria.     Motor: No weakness or tremor.     Coordination: Coordination normal.     Gait: Gait is intact.  Psychiatric:        Mood and Affect: Mood normal.        Behavior: Behavior normal.     ED Results and Treatments Labs (all labs ordered are listed, but only abnormal results are displayed) Labs Reviewed  COMPREHENSIVE METABOLIC PANEL - Abnormal;  Notable for the following components:      Result Value   Calcium 8.6 (*)    All other components within normal limits  CBC WITH DIFFERENTIAL/PLATELET  LIPASE, BLOOD  PREGNANCY, URINE  RAPID URINE DRUG SCREEN, HOSP PERFORMED  TROPONIN I (HIGH SENSITIVITY)                                                                                                                          Radiology MR BRAIN WO CONTRAST  Result Date: 03/30/2023 CLINICAL DATA:  Headache, increasing frequency or severity. EXAM: MRI HEAD WITHOUT CONTRAST TECHNIQUE: Multiplanar, multiecho pulse sequences of the brain and surrounding structures were obtained  without intravenous contrast. COMPARISON:  MRI of the brain March 17, 2023. FINDINGS: Incomplete study at patient's request due to headache. Brain: No acute infarction, hemorrhage, hydrocephalus, extra-axial collection or mass lesion. The brain parenchyma has normal morphology and signal characteristics. Vascular: Normal flow voids. Skull and upper cervical spine: Normal marrow signal. Sinuses/Orbits: Mild mucosal thickening throughout the paranasal sinuses, progressed since prior MRI. The orbits are maintained. Other: None. IMPRESSION: 1. Unremarkable MRI of the brain. 2. Mild inflammatory paranasal sinus disease, progressed since prior MRI. Electronically Signed   By: Baldemar Lenis M.D.   On: 03/30/2023 13:35   CT ABDOMEN PELVIS WO CONTRAST  Result Date: 03/30/2023 CLINICAL DATA:  Recent diagnosis of COVID with nausea and vomiting EXAM: CT ABDOMEN AND PELVIS WITHOUT CONTRAST TECHNIQUE: Multidetector CT imaging of the abdomen and pelvis was performed following the standard protocol without IV contrast. RADIATION DOSE REDUCTION: This exam was performed according to the departmental dose-optimization program which includes automated exposure control, adjustment of the mA and/or kV according to patient size and/or use of iterative reconstruction technique. COMPARISON:   CT abdomen and pelvis dated 06/12/2022 FINDINGS: Lower chest: No focal consolidation or pulmonary nodule in the lung bases. No pleural effusion or pneumothorax demonstrated. Partially imaged heart size is normal. Hepatobiliary: No focal hepatic lesions. No intra or extrahepatic biliary ductal dilation. Cholecystectomy. Pancreas: No focal lesions or main ductal dilation. Spleen: Normal in size without focal abnormality. Adrenals/Urinary Tract: No adrenal nodules. No suspicious renal mass, calculi or hydronephrosis. No focal bladder wall thickening. 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 Fluid and small focus of gas within the excluded segment of the stomach. No evidence of bowel wall thickening, distention, or inflammatory changes. Appendectomy. Vascular/Lymphatic: Aortic atherosclerosis. Retroaortic left renal vein. No enlarged abdominal or pelvic lymph nodes. Reproductive: No adnexal masses. Other: No free fluid, fluid collection, or free air. Musculoskeletal: No acute or abnormal lytic or blastic osseous lesions. Unchanged L3 and L4 compression deformities. IMPRESSION: 1. 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 2.  Aortic Atherosclerosis (ICD10-I70.0). Electronically Signed   By: Agustin Cree M.D.   On: 03/30/2023 13:19   DG Chest Port 1 View  Result Date: 03/30/2023 CLINICAL DATA:  Chest pain. Nausea, vomiting and shortness of breath. EXAM: PORTABLE CHEST 1 VIEW COMPARISON:  None Available. FINDINGS: Heart size and mediastinal contours are unremarkable. There is no pleural fluid, interstitial edema, or airspace disease. The visualized osseous structures are unremarkable. IMPRESSION: No acute cardiopulmonary abnormalities. Electronically Signed   By: Signa Kell M.D.   On: 03/30/2023 08:05    Pertinent labs & imaging results that were available during my care of the patient were  reviewed by me and considered in my medical decision making (see MDM for details).  Medications Ordered in ED Medications  promethazine (PHENERGAN) 25 MG/ML injection (  Not Given 03/30/23 1429)  sodium chloride 0.9 % bolus 1,000 mL (has no administration in time range)  sodium chloride 0.9 % bolus 1,000 mL (0 mLs Intravenous Stopped 03/30/23 0958)  promethazine (PHENERGAN) 25 mg in sodium chloride 0.9 % 50 mL IVPB (0 mg Intravenous Stopped 03/30/23 0958)  alum & mag hydroxide-simeth (MAALOX/MYLANTA) 200-200-20 MG/5ML suspension 30 mL (30 mLs Oral Given 03/30/23 0747)    And  lidocaine (XYLOCAINE) 2 % viscous mouth solution 15 mL (15 mLs Oral Given 03/30/23 0747)  HYDROmorphone (DILAUDID) injection 0.5 mg (0.5 mg Intravenous Given 03/30/23 0955)  ondansetron (  ZOFRAN-ODT) disintegrating tablet 4 mg (4 mg Oral Given 03/30/23 0955)  meclizine (ANTIVERT) tablet 25 mg (25 mg Oral Given 03/30/23 1105)  HYDROmorphone (DILAUDID) injection 1 mg (1 mg Intravenous Given 03/30/23 1217)  metoCLOPramide (REGLAN) injection 5 mg (5 mg Intravenous Given 03/30/23 1217)  diphenhydrAMINE (BENADRYL) injection 25 mg (25 mg Intravenous Given 03/30/23 1216)  ondansetron (ZOFRAN) injection 4 mg (4 mg Intravenous Given 03/30/23 1546)                                                                                                                                     Procedures .Critical Care  Performed by: Sloan Leiter, DO Authorized by: Sloan Leiter, DO   Critical care provider statement:    Critical care time (minutes):  40   Critical care time was exclusive of:  Separately billable procedures and treating other patients   Critical care was time spent personally by me on the following activities:  Development of treatment plan with patient or surrogate, discussions with consultants, evaluation of patient's response to treatment, examination of patient, ordering and review of laboratory studies, ordering and review of radiographic  studies, ordering and performing treatments and interventions, pulse oximetry, re-evaluation of patient's condition and review of old charts   Care discussed with: admitting provider   Comments:     Intractable pain Multiple anti-emetics and analgesics    (including critical care time)  Medical Decision Making / ED Course    Medical Decision Making:    Yoali Boze is a 42 y.o. female with hx as above here with recurrent n/v/chest pressure/dib. The complaint involves an extensive differential diagnosis and also carries with it a high risk of complications and morbidity.  Serious etiology was considered. Ddx includes but is not limited to: Differential diagnosis includes but is not exclusive to acute cholecystitis, intrathoracic causes for epigastric abdominal pain, gastritis, duodenitis, pancreatitis, small bowel or large bowel obstruction, abdominal aortic aneurysm, hernia, gastritis, etc. In my evaluation of this patient's dyspnea my DDx includes, but is not limited to, pneumonia, pulmonary embolism, pneumothorax, pulmonary edema, metabolic acidosis, asthma, COPD, cardiac cause, anemia, anxiety, etc.  Differential includes all life-threatening causes for chest pain. This includes but is not exclusive to acute coronary syndrome, aortic dissection, pulmonary embolism, cardiac tamponade, community-acquired pneumonia, pericarditis, musculoskeletal chest wall pain, etc.   Complete initial physical exam performed, notably the patient  was nad, hds, no resp distress, abd not peritoenal .    Reviewed and confirmed nursing documentation for past medical history, family history, social history.  Vital signs reviewed.    Clinical Course as of 03/30/23 1626  Fri Mar 30, 2023  1004 Of note patient started [facility] around 2 wks ago at the approximate onset of the symptoms.  Dx covid 19 two days ago [SG]  1122 Troponin I (High Sensitivity): <2 [SG]  1122 Feeling better after medications, will trial PO  challenge  [  SG]  1223 She has persistent headache, nausea and vomiting.  Will obtain CT imaging of the abdomen.  Will also get MRI of the brain.  She has persistent anisocoria.  She has been seen at [facility] and was admitted at [facility] previously with the similar complaints.  She had MRI brain, MRI orbits, CT venogram and CT head which were all stable.  Persistence of symptoms, persistent headache, dizziness, nausea and vomiting will get repeat MRI brain [SG]  1516 Admitted [facility], intractable n/v [SG]    Clinical Course User Index [SG] Tanda Rockers A, DO   Workup today overall has been stable.  Unclear etiology of her intractable nausea and vomiting.  She has been on [facility] for 2 weeks which is likely making her symptoms worse.  She is also diagnosed with COVID-19 in the past few days which is also contributing to her presentation today.  Imaging today is stable.  Labs today are stable.  She is unable tolerate p.o. intake.  She received multiple antiemetics with minimal improvement.  Will plan to admit patient for intractable nausea and vomiting, track abdominal pain, ongoing headache.  Etiology today likely multifactorial in setting of recent covid 19 infection, Mounjaro and recurrent n/v Unable to tolerate po, ongoing n/v Admitted [facility]        Additional history obtained: -Additional history obtained from na -External records from outside source obtained and reviewed including: Chart review including previous notes, labs, imaging, consultation notes including prior admission, prior ED visits, prior labs and imaging, outside hospital records.   Lab Tests: -I ordered, reviewed, and interpreted labs.   The pertinent results include:   Labs Reviewed  COMPREHENSIVE METABOLIC PANEL - Abnormal; Notable for the following components:      Result Value   Calcium 8.6 (*)    All other components within normal limits  CBC WITH DIFFERENTIAL/PLATELET  LIPASE, BLOOD  PREGNANCY, URINE  RAPID URINE  DRUG SCREEN, HOSP PERFORMED  TROPONIN I (HIGH SENSITIVITY)    Notable for Labs are stable  EKG   EKG Interpretation Date/Time:  Friday March 30 2023 06:54:18 EDT Ventricular Rate:  97 PR Interval:  142 QRS Duration:  112 QT Interval:  361 QTC Calculation: 459 R Axis:   39  Text Interpretation: Sinus rhythm Borderline intraventricular conduction delay Low voltage, precordial leads Confirmed by Tanda Rockers ([id number]) on 03/30/2023 7:17:07 AM         Imaging Studies ordered: I ordered imaging studies including CT abdomen pelvis, chest x-ray, MRI brain I independently visualized the following imaging with scope of interpretation limited to determining acute life threatening conditions related to emergency care; findings noted above, significant for stable imaging I independently visualized and interpreted imaging. I agree with the radiologist interpretation   Medicines ordered and prescription drug management: Meds ordered this encounter  Medications   sodium chloride 0.9 % bolus 1,000 mL   promethazine (PHENERGAN) 25 mg in sodium chloride 0.9 % 50 mL IVPB   AND Linked Order Group    alum & mag hydroxide-simeth (MAALOX/MYLANTA) 200-200-20 MG/5ML suspension 30 mL    lidocaine (XYLOCAINE) 2 % viscous mouth solution 15 mL   promethazine (PHENERGAN) 25 MG/ML injection    Mechele Collin, Brandi R: cabinet override   HYDROmorphone (DILAUDID) injection 0.5 mg   ondansetron (ZOFRAN-ODT) disintegrating tablet 4 mg   meclizine (ANTIVERT) tablet 25 mg   DISCONTD: ondansetron (ZOFRAN) injection 4 mg   HYDROmorphone (DILAUDID) injection 1 mg   metoCLOPramide (REGLAN) injection 5 mg   diphenhydrAMINE (  BENADRYL) injection 25 mg   ondansetron (ZOFRAN) injection 4 mg   sodium chloride 0.9 % bolus 1,000 mL    -I have reviewed the patients home medicines and have made adjustments as needed   Consultations Obtained: na   Cardiac Monitoring: The patient was maintained on a cardiac monitor.  I  personally viewed and interpreted the cardiac monitored which showed an underlying rhythm of: NSR  Social Determinants of Health:  Diagnosis or treatment significantly limited by social determinants of health: former smoker and obesity   Reevaluation: After the interventions noted above, I reevaluated the patient and found that they have stayed the same  Co morbidities that complicate the patient evaluation  Past Medical History:  Diagnosis Date   Cancer (HCC)    Hypertension    Migraines    Thyroid disease       Dispostion: Disposition decision including need for hospitalization was considered, and patient admitted to the hospital.    Final Clinical Impression(s) / ED Diagnoses Final diagnoses:  Intractable nausea and vomiting  Nonintractable headache, unspecified chronicity pattern, unspecified headache type  COVID-19     This chart was dictated using voice recognition software.  Despite best efforts to proofread,  errors can occur which can change the documentation meaning.    Tanda Rockers A, DO 03/30/23 1626

## 2023-03-30 NOTE — Plan of Care (Signed)

## 2023-03-30 NOTE — ED Notes (Signed)
Called [facility] to transport patient to [facility] rm# [phone]

## 2023-03-30 NOTE — H&P (Signed)
History and Physical    Patient: Hannah Ballard ZOX:[id number] DOB: 08/04/1981 DOA: 03/30/2023 DOS: the patient was seen and examined on 03/30/2023 PCP: [name], NP  Patient coming from: Home  Chief Complaint:  Chief Complaint  Patient presents with   Chest Pain   HPI: Hannah Ballard is a 42 y.o. female with medical history significant of diagnosis of right-sided knee laceration about 3 weeks ago.  This was associated with headache and vision changes.  This has been managed by outpatient ophthalmology, unfortunately patient has continued to have right-sided eye pain/headache associated with vision loss in the site.  Per report to me from the patient, she has been advised by ophthalmology to wait overall.  Of time to see if her vision improves.  Patient started having new onset fever approximately 3 days ago as high as 103 at home.  Patient was admitted to the [facility] with finding of COVID-19 positivity.  Patient was treated with intravenous remdesivir, and discharged home on Paxlovid.  Unfortunately, the 2 days that the patient has been home last 2 days, she has had persistent nausea and on any attempts to eat vomiting.  This is associated with headache and the patient vomits.  However much milder headache otherwise.  There is no abdominal pain no diarrhea.  Patient is having a temperature of 101 F at home.  Patient came to the ER today because of nausea vomiting as well as headache.  ER course is notable for multiple investigative studies being done, trial of oral diet failed at outpatient ER medical evaluation is sought.  Review of Systems: As mentioned in the history of present illness. All other systems reviewed and are negative. Past Medical History:  Diagnosis Date   Cancer (HCC)    Hypertension    Migraines    Thyroid disease    Past Surgical History:  Procedure Laterality Date   ABDOMINAL HYSTERECTOMY  2018   ANKLE SURGERY Left    has hardware   ANTERIOR FUSION  CERVICAL SPINE  2008   APPENDECTOMY  1997   BREAST BIOPSY     BREAST BIOPSY Right 03/12/2023   MM RT BREAST BX W LOC DEV 1ST LESION IMAGE BX SPEC STEREO GUIDE 03/12/2023 [facility]   CHOLECYSTECTOMY     FRACTURE SURGERY     SHOULDER SURGERY Left    has pins   SPINE SURGERY     Social History:  reports that she quit smoking about 5 years ago. Her smoking use included cigarettes. She has never used smokeless tobacco. She reports that she does not drink alcohol and does not use drugs.  Allergies  Allergen Reactions   Aspirin Swelling and Anaphylaxis    Tongue swelling Other reaction(s): Edema face/lips/tongue Facial swelling    Morphine Anaphylaxis and Swelling    Family History  Problem Relation Age of Onset   Healthy Mother    Healthy Father     Prior to Admission medications   Medication Sig Start Date End Date Taking? Authorizing Provider  acetaminophen (TYLENOL) 325 MG tablet Take 3 tablets (975 mg total) by mouth every 6 (six) hours for 10 days. 03/28/23  Yes   benzonatate (TESSALON) 200 MG capsule Take 1 capsule (200 mg total) by mouth 3 (three) times daily as needed for cough 03/28/23  Yes   butalbital-acetaminophen-caffeine (FIORICET) 50-325-40 MG tablet Take 1 tablet by mouth every 6 (six) hours as needed for headache. 03/09/23 03/08/24 Yes Triplett, Cari B, FNP  fluticasone (FLONASE) 50 MCG/ACT nasal  spray Place 2 sprays into both nostrils once daily as needed for Rhinitis 03/28/23  Yes   levothyroxine (SYNTHROID) 150 MCG tablet Take 1 tablet (150 mcg total) by mouth daily. 02/12/23  Yes [name], NP  LORazepam (ATIVAN) 0.5 MG tablet Take 0.5 mg by mouth 3 (three) times daily as needed for anxiety.   Yes [provider]  meclizine (ANTIVERT) 25 MG tablet Take 1 tablet (25 mg total) by mouth 3 (three) times daily as needed for dizziness. 03/21/23  Yes Georga Hacking, MD  meclizine (ANTIVERT) 25 MG tablet Take 0.5 tablets (12.5 mg total) by mouth every 8  (eight) hours as needed. 03/28/23  Yes   ondansetron (ZOFRAN-ODT) 4 MG disintegrating tablet Take 1 tablet (4 mg total) by mouth every 8 (eight) hours as needed. 03/28/23  Yes   pantoprazole (PROTONIX) 40 MG tablet Take 1 tablet (40 mg total) by mouth daily. 02/08/23  Yes [name], NP  prochlorperazine (COMPAZINE) 25 MG suppository Place 1 suppository (25 mg total) rectally every 12 (twelve) hours as needed for nausea 03/29/23  Yes Viviano Simas, FNP  promethazine (PHENERGAN) 25 MG tablet Take 1 tablet (25 mg total) by mouth every 8 (eight) hours as needed for nausea or vomiting. 03/19/23  Yes Lurene Shadow, MD  tirzepatide (ZEPBOUND) 2.5 MG/0.5ML Pen Inject 2.5 mg into the skin once a week.   Yes [provider]  cyclopentolate (CYCLODRYL,CYCLOGYL) 1 % ophthalmic solution Place 1 drop into the right eye 3 (three) times daily. 03/19/23   Lurene Shadow, MD  gatifloxacin (ZYMAXID) 0.5 % SOLN Place 1 drop into the right eye every hour. 03/19/23   Lurene Shadow, MD  HYDROcodone-acetaminophen (NORCO/VICODIN) 5-325 MG tablet Take 1 tablet by mouth every 8 (eight) hours as needed for moderate pain. 03/19/23   Lurene Shadow, MD   Patient only takes levothyroxine at home as well as Paxlovid, that she has been unable to take at home Physical Exam: Vitals:   03/30/23 1515 03/30/23 1518 03/30/23 1658 03/30/23 1800  BP: (!) 146/90  (!) 156/104   Pulse: 82  76   Resp: (!) 23  20   Temp:  98.4 F (36.9 C) 98.1 F (36.7 C)   TempSrc:  Oral Oral   SpO2: 99%  99%   Weight:    104.8 kg  Height:    5\' 9"  (1.753 m)   Neural: Alert awake oriented x 3 no distress Right pupillary dilation, submillimeter corneal ulceration in the lower lateral part of the right cornea.  Gazes are equal and pain-free Respiratory; bilateral intravesicular Cardiovascular exam S1-S2 normal Abdomen soft nontender Extremities warm without edema. Data Reviewed:  Labs on Admission:  Results for orders placed or performed  during the hospital encounter of 03/30/23 (from the past 24 hour(s))  Troponin I (High Sensitivity)     Status: None   Collection Time: 03/30/23  7:35 AM  Result Value Ref Range   Troponin I (High Sensitivity) <2 <18 ng/L  Comprehensive metabolic panel     Status: Abnormal   Collection Time: 03/30/23  7:35 AM  Result Value Ref Range   Sodium 140 135 - 145 mmol/L   Potassium 3.6 3.5 - 5.1 mmol/L   Chloride 105 98 - 111 mmol/L   CO2 23 22 - 32 mmol/L   Glucose, Bld 92 70 - 99 mg/dL   BUN 17 6 - 20 mg/dL   Creatinine, Ser 1.61 0.44 - 1.00 mg/dL   Calcium 8.6 (L) 8.9 - 10.3 mg/dL  Total Protein 7.6 6.5 - 8.1 g/dL   Albumin 4.6 3.5 - 5.0 g/dL   AST 17 15 - 41 U/L   ALT 24 0 - 44 U/L   Alkaline Phosphatase 79 38 - 126 U/L   Total Bilirubin 0.5 0.3 - 1.2 mg/dL   GFR, Estimated >16 >10 mL/min   Anion gap 12 5 - 15  CBC with Differential     Status: None   Collection Time: 03/30/23  7:35 AM  Result Value Ref Range   WBC 5.6 4.0 - 10.5 K/uL   RBC 4.34 3.87 - 5.11 MIL/uL   Hemoglobin 13.7 12.0 - 15.0 g/dL   HCT 96.0 45.4 - 09.8 %   MCV 94.0 80.0 - 100.0 fL   MCH 31.6 26.0 - 34.0 pg   MCHC 33.6 30.0 - 36.0 g/dL   RDW 11.9 14.7 - 82.9 %   Platelets 283 150 - 400 K/uL   nRBC 0.0 0.0 - 0.2 %   Neutrophils Relative % 60 %   Neutro Abs 3.3 1.7 - 7.7 K/uL   Lymphocytes Relative 30 %   Lymphs Abs 1.7 0.7 - 4.0 K/uL   Monocytes Relative 7 %   Monocytes Absolute 0.4 0.1 - 1.0 K/uL   Eosinophils Relative 2 %   Eosinophils Absolute 0.1 0.0 - 0.5 K/uL   Basophils Relative 1 %   Basophils Absolute 0.0 0.0 - 0.1 K/uL   Immature Granulocytes 0 %   Abs Immature Granulocytes 0.01 0.00 - 0.07 K/uL  Lipase, blood     Status: None   Collection Time: 03/30/23  7:35 AM  Result Value Ref Range   Lipase 18 11 - 51 U/L  Pregnancy, urine     Status: None   Collection Time: 03/30/23 11:30 AM  Result Value Ref Range   Preg Test, Ur NEGATIVE NEGATIVE   Basic Metabolic Panel: Recent Labs  Lab  03/29/23 1956 03/30/23 0735  NA 140 140  K 3.4* 3.6  CL 104 105  CO2 26 23  GLUCOSE 90 92  BUN 17 17  CREATININE 0.96 0.80  CALCIUM 8.7* 8.6*   Liver Function Tests: Recent Labs  Lab 03/29/23 1956 03/30/23 0735  AST 15 17  ALT 24 24  ALKPHOS 77 79  BILITOT 0.4 0.5  PROT 7.1 7.6  ALBUMIN 4.3 4.6   Recent Labs  Lab 03/29/23 1956 03/30/23 0735  LIPASE 17 18   No results for input(s): "AMMONIA" in the last 168 hours. CBC: Recent Labs  Lab 03/29/23 1956 03/30/23 0735  WBC 4.9 5.6  NEUTROABS 2.5 3.3  HGB 13.1 13.7  HCT 39.6 40.8  MCV 93.8 94.0  PLT 271 283   Cardiac Enzymes: Recent Labs  Lab 03/30/23 0735  TROPONINIHS <2    BNP (last 3 results) No results for input(s): "PROBNP" in the last 8760 hours. CBG: No results for input(s): "GLUCAP" in the last 168 hours.  Radiological Exams on Admission:  MR BRAIN WO CONTRAST  Result Date: 03/30/2023 CLINICAL DATA:  Headache, increasing frequency or severity. EXAM: MRI HEAD WITHOUT CONTRAST TECHNIQUE: Multiplanar, multiecho pulse sequences of the brain and surrounding structures were obtained without intravenous contrast. COMPARISON:  MRI of the brain March 17, 2023. FINDINGS: Incomplete study at patient's request due to headache. Brain: No acute infarction, hemorrhage, hydrocephalus, extra-axial collection or mass lesion. The brain parenchyma has normal morphology and signal characteristics. Vascular: Normal flow voids. Skull and upper cervical spine: Normal marrow signal. Sinuses/Orbits: Mild mucosal thickening throughout the paranasal  sinuses, progressed since prior MRI. The orbits are maintained. Other: None. IMPRESSION: 1. Unremarkable MRI of the brain. 2. Mild inflammatory paranasal sinus disease, progressed since prior MRI. Electronically Signed   By: Baldemar Lenis M.D.   On: 03/30/2023 13:35   CT ABDOMEN PELVIS WO CONTRAST  Result Date: 03/30/2023 CLINICAL DATA:  Recent diagnosis of COVID with  nausea and vomiting EXAM: CT ABDOMEN AND PELVIS WITHOUT CONTRAST TECHNIQUE: Multidetector CT imaging of the abdomen and pelvis was performed following the standard protocol without IV contrast. RADIATION DOSE REDUCTION: This exam was performed according to the departmental dose-optimization program which includes automated exposure control, adjustment of the mA and/or kV according to patient size and/or use of iterative reconstruction technique. COMPARISON:  CT abdomen and pelvis dated 06/12/2022 FINDINGS: Lower chest: No focal consolidation or pulmonary nodule in the lung bases. No pleural effusion or pneumothorax demonstrated. Partially imaged heart size is normal. Hepatobiliary: No focal hepatic lesions. No intra or extrahepatic biliary ductal dilation. Cholecystectomy. Pancreas: No focal lesions or main ductal dilation. Spleen: Normal in size without focal abnormality. Adrenals/Urinary Tract: No adrenal nodules. No suspicious renal mass, calculi or hydronephrosis. No focal bladder wall thickening. 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 Fluid and small focus of gas within the excluded segment of the stomach. No evidence of bowel wall thickening, distention, or inflammatory changes. Appendectomy. Vascular/Lymphatic: Aortic atherosclerosis. Retroaortic left renal vein. No enlarged abdominal or pelvic lymph nodes. Reproductive: No adnexal masses. Other: No free fluid, fluid collection, or free air. Musculoskeletal: No acute or abnormal lytic or blastic osseous lesions. Unchanged L3 and L4 compression deformities. IMPRESSION: 1. 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 2.  Aortic Atherosclerosis (ICD10-I70.0). Electronically Signed   By: Agustin Cree M.D.   On: 03/30/2023 13:19   DG Chest Port 1 View  Result Date: 03/30/2023 CLINICAL DATA:  Chest pain. Nausea, vomiting and shortness of breath.  EXAM: PORTABLE CHEST 1 VIEW COMPARISON:  None Available. FINDINGS: Heart size and mediastinal contours are unremarkable. There is no pleural fluid, interstitial edema, or airspace disease. The visualized osseous structures are unremarkable. IMPRESSION: No acute cardiopulmonary abnormalities. Electronically Signed   By: Signa Kell M.D.   On: 03/30/2023 08:05    EKG: Independently reviewed. NSR   Assessment and Plan: Vomiting Unclear etiology at this time, could be related to COVID-19 versus severe pain.  Patient also reports being started on [facility] about a week ago that has since then been stopped.  Pain medications as ordered below.  Remdesivir treatment started.  For the vomiting we will give Zofran as needed.  Empiric pantoprazole treatment.  I will check stool for H. pylori.  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  This may be reviewed by specialist routinely.  I believe this is a normal finding after this surgery.  However we may need a specialist opinion.  COVID-19 virus infection Diagnosed on March 27, 2023 at [facility].  Initially treated with remdesivir, unfortunately patient did not tolerate Paxlovid, I will restart remdesivir.  Patient is not hypoxic at this time.  Acute right eye pain Has marked pain from her right corneal ulcer/scarring.  Per report this has been managed by ophthalmology as an outpatient.  However patient is having residual pain, patient had good response to IV Dilaudid in the ER, continue same.      Advance Care Planning:   Code  Status: Full Code   Consults: None at this time  Family Communication: As per patient  Severity of Illness: The appropriate patient status for this patient is OBSERVATION. Observation status is judged to be reasonable and necessary in order to provide the required intensity of service to ensure the patient's safety. The patient's presenting symptoms, physical exam findings, and  initial radiographic and laboratory data in the context of their medical condition is felt to place them at decreased risk for further clinical deterioration. Furthermore, it is anticipated that the patient will be medically stable for discharge from the hospital within 2 midnights of admission.   Author: Nolberto Hanlon, MD 03/30/2023 7:08 PM  For on call review [email or web address].

## 2023-03-30 NOTE — Progress Notes (Signed)
Patient arrived to [facility] accompanied by EMS. Patient is awake, alert, orient x4. [facility] hospitalist notified of patient's arrival. Patient orientated to unit. Safety precautions maintained.   03/30/23 1658  Vitals  Temp 98.1 F (36.7 C)  Temp Source Oral  BP (!) 156/104  MAP (mmHg) 121  BP Location Right Arm  BP Method Automatic  Patient Position (if appropriate) Sitting  Pulse Rate 76  Pulse Rate Source Monitor  Resp 20  Level of Consciousness  Level of Consciousness Alert  MEWS COLOR  MEWS Score Color Green  Oxygen Therapy  SpO2 99 %  O2 Device Room Air  MEWS Score  MEWS Temp 0  MEWS Systolic 0  MEWS Pulse 0  MEWS RR 0  MEWS LOC 0  MEWS Score 0

## 2023-03-31 DIAGNOSIS — B342 Coronavirus infection, unspecified: Secondary | ICD-10-CM | POA: Diagnosis not present

## 2023-03-31 DIAGNOSIS — R600 Localized edema: Secondary | ICD-10-CM | POA: Diagnosis not present

## 2023-03-31 DIAGNOSIS — H5710 Ocular pain, unspecified eye: Secondary | ICD-10-CM | POA: Diagnosis not present

## 2023-03-31 DIAGNOSIS — K2950 Unspecified chronic gastritis without bleeding: Secondary | ICD-10-CM | POA: Diagnosis not present

## 2023-03-31 DIAGNOSIS — E860 Dehydration: Secondary | ICD-10-CM | POA: Diagnosis not present

## 2023-03-31 DIAGNOSIS — U071 COVID-19: Secondary | ICD-10-CM | POA: Diagnosis not present

## 2023-03-31 DIAGNOSIS — R079 Chest pain, unspecified: Secondary | ICD-10-CM | POA: Diagnosis present

## 2023-03-31 DIAGNOSIS — Z6834 Body mass index (BMI) 34.0-34.9, adult: Secondary | ICD-10-CM | POA: Diagnosis not present

## 2023-03-31 DIAGNOSIS — I82612 Acute embolism and thrombosis of superficial veins of left upper extremity: Secondary | ICD-10-CM | POA: Diagnosis not present

## 2023-03-31 DIAGNOSIS — K224 Dyskinesia of esophagus: Secondary | ICD-10-CM | POA: Diagnosis not present

## 2023-03-31 DIAGNOSIS — E669 Obesity, unspecified: Secondary | ICD-10-CM | POA: Diagnosis not present

## 2023-03-31 DIAGNOSIS — Z79890 Hormone replacement therapy (postmenopausal): Secondary | ICD-10-CM | POA: Diagnosis not present

## 2023-03-31 DIAGNOSIS — K5900 Constipation, unspecified: Secondary | ICD-10-CM | POA: Diagnosis not present

## 2023-03-31 DIAGNOSIS — K449 Diaphragmatic hernia without obstruction or gangrene: Secondary | ICD-10-CM | POA: Diagnosis not present

## 2023-03-31 DIAGNOSIS — K5710 Diverticulosis of small intestine without perforation or abscess without bleeding: Secondary | ICD-10-CM | POA: Diagnosis not present

## 2023-03-31 DIAGNOSIS — R110 Nausea: Secondary | ICD-10-CM | POA: Diagnosis not present

## 2023-03-31 DIAGNOSIS — T383X5A Adverse effect of insulin and oral hypoglycemic [antidiabetic] drugs, initial encounter: Secondary | ICD-10-CM | POA: Diagnosis not present

## 2023-03-31 DIAGNOSIS — Z87891 Personal history of nicotine dependence: Secondary | ICD-10-CM | POA: Diagnosis not present

## 2023-03-31 DIAGNOSIS — Z9884 Bariatric surgery status: Secondary | ICD-10-CM | POA: Diagnosis not present

## 2023-03-31 DIAGNOSIS — M7989 Other specified soft tissue disorders: Secondary | ICD-10-CM | POA: Diagnosis not present

## 2023-03-31 DIAGNOSIS — M79601 Pain in right arm: Secondary | ICD-10-CM | POA: Diagnosis not present

## 2023-03-31 DIAGNOSIS — G43909 Migraine, unspecified, not intractable, without status migrainosus: Secondary | ICD-10-CM | POA: Diagnosis present

## 2023-03-31 DIAGNOSIS — F419 Anxiety disorder, unspecified: Secondary | ICD-10-CM | POA: Diagnosis not present

## 2023-03-31 DIAGNOSIS — R519 Headache, unspecified: Secondary | ICD-10-CM | POA: Diagnosis not present

## 2023-03-31 DIAGNOSIS — H16001 Unspecified corneal ulcer, right eye: Secondary | ICD-10-CM | POA: Diagnosis present

## 2023-03-31 DIAGNOSIS — K2100 Gastro-esophageal reflux disease with esophagitis, without bleeding: Secondary | ICD-10-CM | POA: Diagnosis not present

## 2023-03-31 DIAGNOSIS — E039 Hypothyroidism, unspecified: Secondary | ICD-10-CM | POA: Diagnosis not present

## 2023-03-31 DIAGNOSIS — Z980 Intestinal bypass and anastomosis status: Secondary | ICD-10-CM | POA: Diagnosis not present

## 2023-03-31 DIAGNOSIS — K316 Fistula of stomach and duodenum: Secondary | ICD-10-CM | POA: Diagnosis not present

## 2023-03-31 DIAGNOSIS — Z9049 Acquired absence of other specified parts of digestive tract: Secondary | ICD-10-CM | POA: Diagnosis not present

## 2023-03-31 DIAGNOSIS — L03113 Cellulitis of right upper limb: Secondary | ICD-10-CM | POA: Diagnosis not present

## 2023-03-31 DIAGNOSIS — K3184 Gastroparesis: Secondary | ICD-10-CM | POA: Diagnosis present

## 2023-03-31 DIAGNOSIS — Z90710 Acquired absence of both cervix and uterus: Secondary | ICD-10-CM | POA: Diagnosis not present

## 2023-03-31 DIAGNOSIS — R112 Nausea with vomiting, unspecified: Secondary | ICD-10-CM | POA: Diagnosis not present

## 2023-03-31 DIAGNOSIS — I10 Essential (primary) hypertension: Secondary | ICD-10-CM | POA: Diagnosis not present

## 2023-03-31 DIAGNOSIS — Z885 Allergy status to narcotic agent status: Secondary | ICD-10-CM | POA: Diagnosis not present

## 2023-03-31 DIAGNOSIS — Z886 Allergy status to analgesic agent status: Secondary | ICD-10-CM | POA: Diagnosis not present

## 2023-03-31 LAB — APTT: aPTT: 29 seconds (ref 24–36)

## 2023-03-31 LAB — PROTIME-INR
INR: 1.1 (ref 0.8–1.2)
Prothrombin Time: 14.4 seconds (ref 11.4–15.2)

## 2023-03-31 LAB — CULTURE, BLOOD (ROUTINE X 2): Special Requests: ADEQUATE

## 2023-03-31 MED ORDER — MELATONIN 5 MG PO TABS
5.0000 mg | ORAL_TABLET | Freq: Every evening | ORAL | Status: AC | PRN
  Administered 2023-03-31 – 2023-04-01 (×2): 5 mg via ORAL
  Filled 2023-03-31 (×2): qty 1

## 2023-03-31 MED ORDER — ORAL CARE MOUTH RINSE: 15.0000 mL | OROMUCOSAL | Status: DC | PRN

## 2023-03-31 MED ORDER — ONDANSETRON HCL 4 MG/2ML IJ SOLN
4.0000 mg | INTRAMUSCULAR | Status: DC
  Administered 2023-03-31 – 2023-04-04 (×24): 4 mg via INTRAVENOUS
  Filled 2023-03-31 (×24): qty 2

## 2023-03-31 MED ORDER — SCOPOLAMINE 1 MG/3DAYS TD PT72
1.0000 | MEDICATED_PATCH | TRANSDERMAL | Status: DC
  Administered 2023-03-31 – 2023-04-06 (×3): 1.5 mg via TRANSDERMAL
  Filled 2023-03-31 (×4): qty 1

## 2023-03-31 MED ORDER — ONDANSETRON HCL 4 MG PO TABS
4.0000 mg | ORAL_TABLET | ORAL | Status: DC
  Administered 2023-04-02 – 2023-04-04 (×3): 4 mg via ORAL
  Filled 2023-03-31 (×4): qty 1

## 2023-03-31 MED ORDER — PROCHLORPERAZINE EDISYLATE 10 MG/2ML IJ SOLN
5.0000 mg | Freq: Four times a day (QID) | INTRAMUSCULAR | Status: AC | PRN
  Administered 2023-03-31 – 2023-04-01 (×2): 5 mg via INTRAVENOUS
  Filled 2023-03-31 (×2): qty 2

## 2023-03-31 NOTE — Plan of Care (Signed)
Patient awake, alert, orient x4. Remains on RA. Ongoing c/o headache, right pain- relieved with Diluadid. Ongoing c/o nausea given schedule Zofran with good relief noted. Up ambulating in hallway this shift. Tolerating clear liquids at times. Spouse at bedside. Safety precautions maintained. Problem: Education: Goal: Knowledge of General Education information will improve Description: Including pain rating scale, medication(s)/side effects and non-pharmacologic comfort measures Outcome: Progressing   Problem: Health Behavior/Discharge Planning: Goal: Ability to manage health-related needs will improve Outcome: Progressing   Problem: Clinical Measurements: Goal: Ability to maintain clinical measurements within normal limits will improve Outcome: Progressing Goal: Will remain free from infection Outcome: Progressing Goal: Diagnostic test results will improve Outcome: Progressing Goal: Respiratory complications will improve Outcome: Progressing Goal: Cardiovascular complication will be avoided Outcome: Progressing   Problem: Activity: Goal: Risk for activity intolerance will decrease Outcome: Progressing   Problem: Nutrition: Goal: Adequate nutrition will be maintained Outcome: Progressing   Problem: Coping: Goal: Level of anxiety will decrease Outcome: Progressing   Problem: Elimination: Goal: Will not experience complications related to bowel motility Outcome: Progressing Goal: Will not experience complications related to urinary retention Outcome: Progressing   Problem: Pain Managment: Goal: General experience of comfort will improve Outcome: Progressing   Problem: Safety: Goal: Ability to remain free from injury will improve Outcome: Progressing   Problem: Skin Integrity: Goal: Risk for impaired skin integrity will decrease Outcome: Progressing   Problem: Education: Goal: Knowledge of risk factors and measures for prevention of condition will improve Outcome:  Progressing   Problem: Coping: Goal: Psychosocial and spiritual needs will be supported Outcome: Progressing   Problem: Respiratory: Goal: Will maintain a patent airway Outcome: Progressing Goal: Complications related to the disease process, condition or treatment will be avoided or minimized Outcome: Progressing

## 2023-03-31 NOTE — Progress Notes (Signed)
Progress Note   Patient: Hannah Ballard:[id number] DOB: 08/28/1981 DOA: 03/30/2023     1 DOS: the patient was seen and examined on 03/31/2023   Brief hospital course: Hannah Ballard is a 42 y.o. female with medical history significant of diagnosis of right-sided knee laceration about 3 weeks ago.  This was associated with headache and vision changes.  This has been managed by outpatient ophthalmology, unfortunately patient has continued to have right-sided eye pain/headache associated with vision loss in the site.  Per report to me from the patient, she has been advised by ophthalmology to wait overall.  Of time to see if her vision improves.   Patient started having new onset fever approximately 3 days ago as high as 103 at home.  Patient was admitted to the [facility] healthcare system with finding of COVID-19 positivity.  Patient was treated with intravenous remdesivir, and discharged home on Paxlovid.  Unfortunately, the 2 days that the patient has been home last 2 days, she has had persistent nausea and on any attempts to eat vomiting.  This is associated with headache and the patient vomits.  However much milder headache otherwise.  There is no abdominal pain no diarrhea.  Patient is having a temperature of 101 F at home.  Patient came to the ER today because of nausea vomiting as well as headache.   ER course is notable for multiple investigative studies being done, trial of oral diet failed at outpatient ER medical evaluation is sought.    Assessment and Plan: Headache Patient with intense pain with acute emesis. Will work to aggressively control her emesis.  Dehydration Continue IV fluid repletion since she is unable to keep liquids down.  Vomiting Unclear etiology at this time, could be related to COVID-19 versus severe pain.  Patient also reports being started on [facility] about a week ago that has since then been stopped.  Pain medications as ordered below.  Remdesivir treatment started.  For  the vomiting we will give Zofran as needed.  Empiric pantoprazole treatment.  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  Reviewed with Gen surg and they note this is not an acute cause of N/V and that outpt. Referral back to bypass surgeon is appropriate. Will change to scheduled Zofran and add scopolamine patch.  COVID-19 virus infection Diagnosed on March 27, 2023 at [facility].  Initially treated with remdesivir, unfortunately patient did not tolerate Paxlovid, I will restart remdesivir.  Patient is not hypoxic at this time. ? Is this causing her to have increased N/V  Acute right eye pain Has marked pain from her right corneal ulcer/scarring. Seems to have impacted severe headache with emesis. Will work to control her emesis. Per report this has been managed by ophthalmology as an outpatient.  However patient is having residual pain, patient had good response to IV Dilaudid in the ER, continue same.  Acetaminophen ordered.  Patient reports that she has been advised not to take NSAIDs because of her gastric bypass surgery.  I plan to transition patient to oral pain medication at the same time that we start oral diet.   Subjective: Continues to have significant nausea and dry heaves today.  She denies significant abdominal pain  Physical Exam: Vitals:   03/30/23 1800 03/30/23 2055 03/31/23 0103 03/31/23 0549  BP:  (!) 157/84 124/65 96/78  Pulse:  84 71 72  Resp:  18 18 18   Temp:  97.9 F (36.6 C) 97.9 F (36.6  C) 97.6 F (36.4 C)  TempSrc:  Oral  Oral  SpO2:  100% 96% 96%  Weight: 104.8 kg     Height: 5\' 9"  (1.753 m)      Physical Examination: General appearance - alert, well appearing, and in no distress Chest -normal effort Heart - normal rate and regular rhythm Abdomen - soft, nontender, nondistended, no masses or organomegaly  Data Reviewed: Results for orders placed or performed during the hospital encounter of 03/30/23 (from the  past 24 hour(s))  CBC     Status: None   Collection Time: 03/30/23  8:35 PM  Result Value Ref Range   WBC 5.2 4.0 - 10.5 K/uL   RBC 4.20 3.87 - 5.11 MIL/uL   Hemoglobin 13.1 12.0 - 15.0 g/dL   HCT 16.1 09.6 - 04.5 %   MCV 97.9 80.0 - 100.0 fL   MCH 31.2 26.0 - 34.0 pg   MCHC 31.9 30.0 - 36.0 g/dL   RDW 40.9 81.1 - 91.4 %   Platelets 248 150 - 400 K/uL   nRBC 0.0 0.0 - 0.2 %  Creatinine, serum     Status: None   Collection Time: 03/30/23  8:35 PM  Result Value Ref Range   Creatinine, Ser 0.73 0.44 - 1.00 mg/dL   GFR, Estimated >78 >29 mL/min  Culture, blood (Routine X 2) w Reflex to ID Panel     Status: None (Preliminary result)   Collection Time: 03/30/23  8:35 PM   Specimen: BLOOD  Result Value Ref Range   Specimen Description      BLOOD Performed at [facility], [address], [place], [place] [address]    Special Requests      Blood Culture adequate volume BOTTLES DRAWN AEROBIC AND ANAEROBIC Performed at [facility], [address], [place], [place] [address]    Culture      NO GROWTH < 12 HOURS Performed at [facility], [address], [place], [place] [address]    Report Status PENDING   Culture, blood (Routine X 2) w Reflex to ID Panel     Status: None (Preliminary result)   Collection Time: 03/30/23  8:35 PM   Specimen: BLOOD  Result Value Ref Range   Specimen Description      BLOOD Performed at [facility], [address] [place], [place] [address]    Special Requests      Blood Culture adequate volume BOTTLES DRAWN AEROBIC AND ANAEROBIC Performed at [facility], [address], [place], [place] [address]    Culture      NO GROWTH < 12 HOURS Performed at [facility], [address] [place], [place] [address]    Report Status PENDING   Rapid urine drug screen (hospital performed)     Status: None   Collection Time: 03/30/23  9:24 PM  Result Value Ref Range    Opiates NONE DETECTED NONE DETECTED   Cocaine NONE DETECTED NONE DETECTED   Benzodiazepines NONE DETECTED NONE DETECTED   Amphetamines NONE DETECTED NONE DETECTED   Tetrahydrocannabinol NONE DETECTED NONE DETECTED   Barbiturates NONE DETECTED NONE DETECTED  APTT     Status: None   Collection Time: 03/31/23  3:28 AM  Result Value Ref Range   aPTT 29 24 - 36 seconds  Protime-INR     Status: None   Collection Time: 03/31/23  3:28 AM  Result Value Ref Range   Prothrombin Time 14.4  11.4 - 15.2 seconds   INR 1.1 0.8 - 1.2     Family Communication: Husband at bedside  Disposition: Status is: Inpatient Remains inpatient appropriate because: Continues to need IV fluid repletion due to dehydration and inability to keep p.o. down   Planned Discharge Destination: Home   Time spent: 42 minutes  Author: Reva Bores, MD 03/31/2023 11:52 AM  For on call review [email or web address].

## 2023-03-31 NOTE — Assessment & Plan Note (Signed)
Continue IV fluid repletion since she is unable to keep liquids down.

## 2023-03-31 NOTE — Assessment & Plan Note (Signed)
Patient with intense pain with acute emesis. Will work to aggressively control her emesis.

## 2023-03-31 NOTE — Hospital Course (Signed)
Hannah Ballard is a 42 y.o. female with medical history significant of diagnosis of right-sided knee laceration about 3 weeks ago.  This was associated with headache and vision changes.  This has been managed by outpatient ophthalmology, unfortunately patient has continued to have right-sided eye pain/headache associated with vision loss in the site.  Per report to me from the patient, she has been advised by ophthalmology to wait overall.  Of time to see if her vision improves.   Patient started having new onset fever approximately 3 days ago as high as 103 at home.  Patient was admitted to the [facility] healthcare system with finding of COVID-19 positivity.  Patient was treated with intravenous remdesivir, and discharged home on Paxlovid.  Unfortunately, the 2 days that the patient has been home last 2 days, she has had persistent nausea and on any attempts to eat vomiting.  This is associated with headache and the patient vomits.  However much milder headache otherwise.  There is no abdominal pain no diarrhea.  Patient is having a temperature of 101 F at home.  Patient came to the ER today because of nausea vomiting as well as headache.   ER course is notable for multiple investigative studies being done, trial of oral diet failed at outpatient ER medical evaluation is sought.

## 2023-04-01 ENCOUNTER — Inpatient Hospital Stay (HOSPITAL_COMMUNITY): Payer: Commercial Managed Care - PPO

## 2023-04-01 LAB — CULTURE, BLOOD (ROUTINE X 2)
Culture: NO GROWTH
Culture: NO GROWTH
Special Requests: ADEQUATE

## 2023-04-01 LAB — TSH: TSH: 41.514 u[IU]/mL — ABNORMAL HIGH (ref 0.350–4.500)

## 2023-04-01 MED ORDER — PROCHLORPERAZINE EDISYLATE 10 MG/2ML IJ SOLN: 10.0000 mg | Freq: Four times a day (QID) | INTRAMUSCULAR | Status: DC

## 2023-04-01 MED ORDER — PROCHLORPERAZINE EDISYLATE 10 MG/2ML IJ SOLN
10.0000 mg | Freq: Four times a day (QID) | INTRAMUSCULAR | Status: DC
  Administered 2023-04-01 – 2023-04-09 (×31): 10 mg via INTRAVENOUS
  Filled 2023-04-01 (×32): qty 2

## 2023-04-01 MED ORDER — GUAIFENESIN-DM 100-10 MG/5ML PO SYRP
5.0000 mL | ORAL_SOLUTION | ORAL | Status: DC | PRN
  Administered 2023-04-01 – 2023-04-02 (×2): 5 mL via ORAL
  Filled 2023-04-01 (×2): qty 5

## 2023-04-01 NOTE — Progress Notes (Signed)
Progress Note   Patient: Hannah Ballard EXB:[id number] DOB: [birth year] DOA: 03/30/2023     2 DOS: the patient was seen and examined on 04/01/2023   Brief hospital course: Hannah Ballard is a 42 y.o. female with medical history significant of diagnosis of right-sided knee laceration about 3 weeks ago.  This was associated with headache and vision changes.  This has been managed by outpatient ophthalmology, unfortunately patient has continued to have right-sided eye pain/headache associated with vision loss in the site.  Per report to me from the patient, she has been advised by ophthalmology to wait overall.  Of time to see if her vision improves.   Patient started having new onset fever approximately 3 days ago as high as 103 at home.  Patient was admitted to the [facility] healthcare system with finding of COVID-19 positivity.  Patient was treated with intravenous remdesivir, and discharged home on Paxlovid.  Unfortunately, the 2 days that the patient has been home last 2 days, she has had persistent nausea and on any attempts to eat vomiting.  This is associated with headache and the patient vomits.  However much milder headache otherwise.  There is no abdominal pain no diarrhea.  Patient is having a temperature of 101 F at home.  Patient came to the ER today because of nausea vomiting as well as headache.   ER course is notable for multiple investigative studies being done, trial of oral diet failed at outpatient ER medical evaluation is sought.    Assessment and Plan: Headache Patient with intense pain with acute emesis. Will work to aggressively control her emesis.  Dehydration Continue IV fluid repletion since she is unable to keep liquids down.  Vomiting Unclear etiology at this time, could be related to COVID-19 versus severe pain related to eye.   Patient also reports being started on [facility] about a week ago that has since then been stopped.   Zofran scheduled. Empiric pantoprazole treatment.   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  Reviewed with Gen surg and they note this is not an acute cause of N/V and that outpt. Referral back to bypass surgeon is appropriate. GI consult Scopolamine patch. Scheduled compazine  COVID-19 virus infection Diagnosed on March 27, 2023 at [facility].  Initially treated with remdesivir, unfortunately patient did not tolerate Paxlovid.  Initially placed on Remdesivir here, but due to lack of respiratory sx's, this was discontinued. ? Is this causing her to have increased N/V  Acute right eye pain Has marked pain from her right corneal ulcer/scarring. Seems to have impacted severe headache with emesis.  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  Required rescue Compazine yesterday.  Physical Exam: Vitals:   03/31/23 0549 03/31/23 1324 03/31/23 1913 04/01/23 0337  BP: 96/78 (!) 145/95 (!) 122/95 (!) 131/92  Pulse: 72 86 74 72  Resp: 18  17 16   Temp: 97.6 F (36.4 C) 98.2 F (36.8 C) 98.3 F (36.8 C) 98.1 F (36.7 C)  TempSrc: Oral Oral Oral Oral  SpO2: 96% 100% 100% 95%  Weight:      Height:       Physical Examination: General appearance - alert, well appearing, and in no distress Chest -normal effort Heart - normal rate and regular rhythm Abdomen - soft, nontender, nondistended, no masses or organomegaly  Data Reviewed: No results found for this or  any previous visit (from the past 24 hour(s)).  Family Communication: Husband at bedside  Disposition: Status is: Inpatient Remains inpatient appropriate because: Need for IV antiemetics and IV fluids  Planned Discharge Destination: Home DVT prophylaxis: Lovenox Time spent: 26 minutes  Author: Reva Bores, MD 04/01/2023 11:04 AM  For on call review [email or web address].

## 2023-04-02 ENCOUNTER — Inpatient Hospital Stay (HOSPITAL_COMMUNITY): Payer: Commercial Managed Care - PPO

## 2023-04-02 DIAGNOSIS — R110 Nausea: Secondary | ICD-10-CM | POA: Diagnosis not present

## 2023-04-02 DIAGNOSIS — U071 COVID-19: Secondary | ICD-10-CM | POA: Diagnosis not present

## 2023-04-02 DIAGNOSIS — R519 Headache, unspecified: Secondary | ICD-10-CM | POA: Diagnosis not present

## 2023-04-02 DIAGNOSIS — R112 Nausea with vomiting, unspecified: Secondary | ICD-10-CM | POA: Diagnosis not present

## 2023-04-02 DIAGNOSIS — H5710 Ocular pain, unspecified eye: Secondary | ICD-10-CM

## 2023-04-02 LAB — CORTISOL-AM, BLOOD: Cortisol - AM: 3.4 ug/dL — ABNORMAL LOW (ref 6.7–22.6)

## 2023-04-02 LAB — T4, FREE: Free T4: 0.83 ng/dL (ref 0.61–1.12)

## 2023-04-02 LAB — CULTURE, BLOOD (ROUTINE X 2)

## 2023-04-02 MED ORDER — FLUTICASONE PROPIONATE 50 MCG/ACT NA SUSP: 1.0000 | Freq: Every day | NASAL | Status: DC | PRN

## 2023-04-02 MED ORDER — COSYNTROPIN 0.25 MG IJ SOLR
0.2500 mg | Freq: Once | INTRAMUSCULAR | Status: AC
  Administered 2023-04-03: 0.25 mg via INTRAVENOUS
  Filled 2023-04-02 (×2): qty 0.25

## 2023-04-02 MED ORDER — PHENOL 1.4 % MT LIQD
1.0000 | OROMUCOSAL | Status: DC | PRN
  Administered 2023-04-02 – 2023-04-04 (×3): 1 via OROMUCOSAL
  Filled 2023-04-02: qty 177

## 2023-04-02 MED ORDER — LACTATED RINGERS IV SOLN: INTRAVENOUS | Status: DC

## 2023-04-02 MED ORDER — OXYCODONE-ACETAMINOPHEN 5-325 MG PO TABS
1.0000 | ORAL_TABLET | Freq: Four times a day (QID) | ORAL | Status: DC | PRN
  Administered 2023-04-02 – 2023-04-08 (×11): 1 via ORAL
  Filled 2023-04-02 (×12): qty 1

## 2023-04-02 MED ORDER — BUTALBITAL-APAP-CAFFEINE 50-325-40 MG PO TABS
1.0000 | ORAL_TABLET | Freq: Four times a day (QID) | ORAL | Status: DC | PRN
  Administered 2023-04-03 – 2023-04-09 (×2): 1 via ORAL
  Filled 2023-04-02 (×2): qty 1

## 2023-04-02 MED ORDER — LEVOTHYROXINE SODIUM 100 MCG/5ML IV SOLN
100.0000 ug | Freq: Every day | INTRAVENOUS | Status: DC
  Administered 2023-04-02 – 2023-04-05 (×4): 100 ug via INTRAVENOUS
  Filled 2023-04-02 (×5): qty 5

## 2023-04-02 MED ORDER — LEVOTHYROXINE SODIUM 100 MCG/5ML IV SOLN: 112.0000 ug | Freq: Every day | INTRAVENOUS | Status: DC

## 2023-04-02 MED ORDER — MECLIZINE HCL 25 MG PO TABS: 25.0000 mg | ORAL_TABLET | Freq: Three times a day (TID) | ORAL | Status: DC | PRN

## 2023-04-02 MED ORDER — LEVOTHYROXINE SODIUM 100 MCG/5ML IV SOLN
112.0000 ug | Freq: Every day | INTRAVENOUS | Status: DC
  Filled 2023-04-02: qty 10

## 2023-04-02 MED ORDER — MELATONIN 5 MG PO TABS: 5.0000 mg | ORAL_TABLET | Freq: Every evening | ORAL | Status: DC | PRN

## 2023-04-02 NOTE — Progress Notes (Signed)
   04/02/23 1218  TOC Brief Assessment  Insurance and Status Reviewed  Patient has primary care physician Yes  Home environment has been reviewed home with spouse  Prior level of function: independent  Prior/Current Home Services No current home services  Social Determinants of Health Reivew SDOH reviewed no interventions necessary  Readmission risk has been reviewed Yes  Transition of care needs no transition of care needs at this time

## 2023-04-02 NOTE — Plan of Care (Signed)
Pt alert and oriented x 4. Up adlib. Pt received zofran and compazine scheduled. Meds overlapped the am. Pt refused compazine and wanted to save for later. Currently not nauseated. Pt has received 3 doses of dilaudid. Pt reports feeling improved with n/v/pain.  Problem: Education: Goal: Knowledge of General Education information will improve Description: Including pain rating scale, medication(s)/side effects and non-pharmacologic comfort measures Outcome: Progressing   Problem: Health Behavior/Discharge Planning: Goal: Ability to manage health-related needs will improve Outcome: Progressing   Problem: Clinical Measurements: Goal: Ability to maintain clinical measurements within normal limits will improve Outcome: Progressing Goal: Will remain free from infection Outcome: Progressing Goal: Diagnostic test results will improve Outcome: Progressing Goal: Respiratory complications will improve Outcome: Progressing Goal: Cardiovascular complication will be avoided Outcome: Progressing   Problem: Activity: Goal: Risk for activity intolerance will decrease Outcome: Progressing   Problem: Nutrition: Goal: Adequate nutrition will be maintained Outcome: Progressing   Problem: Coping: Goal: Level of anxiety will decrease Outcome: Progressing   Problem: Elimination: Goal: Will not experience complications related to bowel motility Outcome: Progressing Goal: Will not experience complications related to urinary retention Outcome: Progressing   Problem: Pain Managment: Goal: General experience of comfort will improve Outcome: Progressing   Problem: Safety: Goal: Ability to remain free from injury will improve Outcome: Progressing   Problem: Skin Integrity: Goal: Risk for impaired skin integrity will decrease Outcome: Progressing   Problem: Education: Goal: Knowledge of risk factors and measures for prevention of condition will improve Outcome: Progressing   Problem:  Coping: Goal: Psychosocial and spiritual needs will be supported Outcome: Progressing   Problem: Respiratory: Goal: Will maintain a patent airway Outcome: Progressing Goal: Complications related to the disease process, condition or treatment will be avoided or minimized Outcome: Progressing   Problem: Education: Goal: Knowledge of General Education information will improve Description: Including pain rating scale, medication(s)/side effects and non-pharmacologic comfort measures Outcome: Progressing   Problem: Health Behavior/Discharge Planning: Goal: Ability to manage health-related needs will improve Outcome: Progressing   Problem: Clinical Measurements: Goal: Ability to maintain clinical measurements within normal limits will improve Outcome: Progressing Goal: Will remain free from infection Outcome: Progressing Goal: Diagnostic test results will improve Outcome: Progressing Goal: Respiratory complications will improve Outcome: Progressing Goal: Cardiovascular complication will be avoided Outcome: Progressing   Problem: Activity: Goal: Risk for activity intolerance will decrease Outcome: Progressing   Problem: Nutrition: Goal: Adequate nutrition will be maintained Outcome: Progressing   Problem: Coping: Goal: Level of anxiety will decrease Outcome: Progressing   Problem: Elimination: Goal: Will not experience complications related to bowel motility Outcome: Progressing Goal: Will not experience complications related to urinary retention Outcome: Progressing   Problem: Pain Managment: Goal: General experience of comfort will improve Outcome: Progressing   Problem: Safety: Goal: Ability to remain free from injury will improve Outcome: Progressing   Problem: Skin Integrity: Goal: Risk for impaired skin integrity will decrease Outcome: Progressing   Problem: Education: Goal: Knowledge of risk factors and measures for prevention of condition will  improve Outcome: Progressing   Problem: Coping: Goal: Psychosocial and spiritual needs will be supported Outcome: Progressing   Problem: Respiratory: Goal: Will maintain a patent airway Outcome: Progressing Goal: Complications related to the disease process, condition or treatment will be avoided or minimized Outcome: Progressing

## 2023-04-02 NOTE — Hospital Course (Addendum)
42 y.o.f w/ diagnosis of right-sided knee laceration about 3 weeks ago.This was associated with headache and vision changes.  This has been managed by outpatient ophthalmology, unfortunately patient has continued to have right-sided eye pain/headache associated with vision loss in the site.Per report from the patient, she has been advised by ophthalmology to wait  overtime to see if her vision improves. Patient started having new onset fever approximately 3 days PTA upto 103 at home.She was admitted to the [facility] healthcare system with finding of COVID-19 positivity.  Patient was treated with intravenous remdesivir, and discharged home on Paxlovid.  Patient has been home last 2 days PTA, she has had persistent nausea and on any attempts to eat vomiting w/ associated headache when patient vomits.However much milder headache otherwise.  There is no abdominal pain no diarrhea. She presented to the ED with vomiting and headache. In the ED vitals stable.  Lites and labs fairly unremarkable.  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 Patient failed trial of oral diet and admission was requested

## 2023-04-02 NOTE — Progress Notes (Signed)
PROGRESS NOTE Hannah Ballard  XBJ:[id number] DOB: 18-Jun-1981 DOA: 03/30/2023 PCP: [name], NP  Brief Narrative/Hospital Course: 42 y.o.f w/ diagnosis of right-sided knee laceration about 3 weeks ago.This was associated with headache and vision changes.  This has been managed by outpatient ophthalmology, unfortunately patient has continued to have right-sided eye pain/headache associated with vision loss in the site.Per report from the patient, she has been advised by ophthalmology to wait  overtime to see if her vision improves. Patient started having new onset fever approximately 3 days PTA upto 103 at home.She was admitted to the [facility] healthcare system with finding of COVID-19 positivity.  Patient was treated with intravenous remdesivir, and discharged home on Paxlovid.  Patient has been home last 2 days PTA, she has had persistent nausea and on any attempts to eat vomiting w/ associated headache when patient vomits.However much milder headache otherwise.  There is no abdominal pain no diarrhea. She presented to the ED with vomiting and headache. In the ED vitals stable.  Lites and labs fairly unremarkable.  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 Patient failed trial of oral diet and admission was requested     Subjective: Patient seen and examined this morning Nursing home that patient has been throwing up unable to tolerate p.o. intake, has lost IV access requesting PICC line    Assessment and Plan: Principal Problem:   Nausea Active Problems:   Headache   Acute right eye pain   COVID-19 virus infection   Vomiting   Dehydration   Nausea and vomiting Dehydration Esophageal dysmotility: Started 2 weeks ago after restarting Mounjaro 4 weeks ago  and also recently on antibiotics and had COVID infection.  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  UDS, urine pregnancy, troponin, lipase and LFTs test negative/normal. Continue supportive care antiemetics IV fluids PPI diet as tolerated.  Upper GI series with KUB 7/8 suspect esophageal dysmotility with poor primary stripping and disordered tertiary contraction  Headache: Reports headache is better  Acute right eye pain/recent right corneal ulcer: Reports ulcer has healed, had some diminished vision and being followed by outpatient ophthalmology.  No new issue currently stable  COVID-19 virus infection: Initially diagnosed on July 2 at [facility] and managed with remdesivir discharged on Paxlovid but unable to tolerate-initially received remdesivir but due to lack of respiratory symptoms has been discontinued-?  Question if it was causing her to have increased nausea vomiting.  Low cortisol: Check cosyntropin test in the morning Tsh HIGH- CHECK FT4 Difficult IV access-midline has been requested  Class I Obesity:Patient's Body mass index is 34.12 kg/m. : Will benefit with PCP follow-up, weight loss  healthy lifestyle and outpatient sleep evaluation.   DVT prophylaxis: enoxaparin (LOVENOX) injection 40 mg Start: 03/31/23 0800 SCDs Start: 03/30/23 1908 Code Status:   Code Status: Full Code Family Communication: plan of care discussed with patient at bedside. Patient status is: Inpatient because of intractable nausea and vomiting Level of care: Med-Surg   Dispo: The patient is from: home            Anticipated disposition: home tbd Objective: Vitals last 24 hrs: Vitals:   04/01/23 0337 04/01/23 1211 04/01/23 1928 04/02/23 0251  BP: (!) 131/92 Marland Kitchen)  127/92 (!) 131/91 119/80  Pulse: 72 85 88 66  Resp: 16 18 17    Temp: 98.1 F (36.7 C) 98 F (36.7 C) 98.1 F (36.7 C)  98.3 F (36.8 C)  TempSrc: Oral  Oral   SpO2: 95% 95% 100% 96%  Weight:      Height:       Weight change:   Physical Examination: General exam: alert awake, older than stated age HEENT:Oral mucosa moist, Ear/Nose WNL grossly Respiratory system: bilaterally clear BS, no use of accessory muscle Cardiovascular system: S1 & S2 +, No JVD. Gastrointestinal system: Abdomen soft,NT,ND, BS+ Nervous System:Alert, awake, moving extremities. Extremities: LE edema neg ,distal peripheral pulses palpable.  Skin: No rashes,no icterus. MSK: Normal muscle bulk,tone, power  Medications reviewed:  Scheduled Meds:  [START ON 04/03/2023] cosyntropin  0.25 mg Intravenous Once   enoxaparin (LOVENOX) injection  40 mg Subcutaneous Q24H   [START ON 04/06/2023] levothyroxine  112 mcg Intravenous Daily   ondansetron  4 mg Oral Q4H   Or   ondansetron (ZOFRAN) IV  4 mg Intravenous Q4H   pantoprazole (PROTONIX) IV  40 mg Intravenous Q12H   prochlorperazine  10 mg Intravenous Q6H   scopolamine  1 patch Transdermal Q72H   sodium chloride flush  3 mL Intravenous Q12H   Continuous Infusions:    Diet Order             Diet clear liquid Room service appropriate? Yes; Fluid consistency: Thin  Diet effective now                  Intake/Output Summary (Last 24 hours) at 04/02/2023 1235 Last data filed at 04/01/2023 1445 Gross per 24 hour  Intake 1068.55 ml  Output --  Net 1068.55 ml   Net IO Since Admission: 6,233.93 mL [04/02/23 1235]  Wt Readings from Last 3 Encounters:  03/30/23 104.8 kg  03/16/23 104.8 kg  03/16/23 104.8 kg     Unresulted Labs (From admission, onward)     Start     Ordered   04/06/23 0500  Creatinine, serum  (enoxaparin (LOVENOX)    CrCl >/= 30 ml/min)  Weekly,   R     Comments: while on enoxaparin therapy    03/30/23 1907   04/03/23 0500  ACTH stimulation, 3 time points  (cosyntropin (CORTROSYN) test and labs)  Tomorrow morning,   R       Comments: For inpatient lab  collection, please call lab to coordinate timing of collections with phlebotomy PRIOR to administering cosyntropin (CORTROSYN).   Question:  Specimen collection method  Answer:  Lab=Lab collect   04/02/23 1217   04/02/23 0851  T4, free  Add-on,   AD       Question:  Specimen collection method  Answer:  Lab=Lab collect   04/02/23 0850          Data Reviewed: I have personally reviewed following labs and imaging studies CBC: Recent Labs  Lab 03/29/23 1956 03/30/23 0735 03/30/23 2035  WBC 4.9 5.6 5.2  NEUTROABS 2.5 3.3  --   HGB 13.1 13.7 13.1  HCT 39.6 40.8 41.1  MCV 93.8 94.0 97.9  PLT 271 283 248   Basic Metabolic Panel: Recent Labs  Lab 03/29/23 1956 03/30/23 0735 03/30/23 2035  NA 140 140  --   K 3.4* 3.6  --   CL 104 105  --   CO2 26 23  --   GLUCOSE 90 92  --   BUN 17 17  --  CREATININE 0.96 0.80 0.73  CALCIUM 8.7* 8.6*  --    GFR: Estimated Creatinine Clearance: 119.2 mL/min (by C-G formula based on SCr of 0.73 mg/dL). Liver Function Tests: Recent Labs  Lab 03/29/23 1956 03/30/23 0735  AST 15 17  ALT 24 24  ALKPHOS 77 79  BILITOT 0.4 0.5  PROT 7.1 7.6  ALBUMIN 4.3 4.6   Recent Labs  Lab 03/29/23 1956 03/30/23 0735  LIPASE 17 18   No results for input(s): "AMMONIA" in the last 168 hours. Coagulation Profile: Recent Labs  Lab 03/31/23 0328  INR 1.1   Recent Labs    04/01/23 1758  TSH 41.514*   Sepsis Labs: No results for input(s): "PROCALCITON", "LATICACIDVEN" in the last 168 hours.  Recent Results (from the past 240 hour(s))  Culture, blood (Routine X 2) w Reflex to ID Panel     Status: None (Preliminary result)   Collection Time: 03/30/23  8:35 PM   Specimen: BLOOD  Result Value Ref Range Status   Specimen Description   Final    BLOOD Performed at [facility], [address], [place], [place] [address]    Special Requests   Final    Blood Culture adequate volume BOTTLES DRAWN AEROBIC AND  ANAEROBIC Performed at [facility], [address], [place], [place] [address]    Culture   Final    NO GROWTH 3 DAYS Performed at [facility], [address] [place], [place] [address]    Report Status PENDING  Incomplete  Culture, blood (Routine X 2) w Reflex to ID Panel     Status: None (Preliminary result)   Collection Time: 03/30/23  8:35 PM   Specimen: BLOOD  Result Value Ref Range Status   Specimen Description   Final    BLOOD Performed at [facility], [address], [place], [place] [address]    Special Requests   Final    Blood Culture adequate volume BOTTLES DRAWN AEROBIC AND ANAEROBIC Performed at [facility], [address] [place], [place] [address]    Culture   Final    NO GROWTH 3 DAYS Performed at [facility], [address] [place], [place] [address]    Report Status PENDING  Incomplete    Antimicrobials: Anti-infectives (From admission, onward)    Start     Dose/Rate Route Frequency Ordered Stop   03/31/23 1000  remdesivir 100 mg in sodium chloride 0.9 % 100 mL IVPB  Status:  Discontinued       See Hyperspace for full Linked Orders Report.   100 mg 200 mL/hr over 30 Minutes Intravenous Daily 03/30/23 1837 03/31/23 1357   03/30/23 2000  remdesivir 200 mg in sodium chloride 0.9% 250 mL IVPB       See Hyperspace for full Linked Orders Report.   200 mg 580 mL/hr over 30 Minutes Intravenous Once 03/30/23 1837 03/30/23 2355      Culture/Microbiology    Component Value Date/Time   SDES  03/30/2023 2035    BLOOD Performed at [facility], [address] [place], [place] [address]    SDES  03/30/2023 2035    BLOOD Performed at [facility], [address] [place], [place] [address]    SPECREQUEST  03/30/2023 2035    Blood Culture adequate volume BOTTLES DRAWN AEROBIC AND ANAEROBIC Performed at [facility], [address] [place], [place] [address]  SPECREQUEST  03/30/2023 2035    Blood Culture adequate volume BOTTLES DRAWN AEROBIC AND ANAEROBIC Performed at [facility], [address], [place], [place] [address]    CULT  03/30/2023 2035    NO GROWTH 3 DAYS Performed at [facility], [address], [place], [place] [address]    CULT  03/30/2023 2035    NO GROWTH 3 DAYS Performed at [facility], [address] [place], [place] [address]    REPTSTATUS PENDING 03/30/2023 2035   REPTSTATUS PENDING 03/30/2023 2035  Other culture-see note  Radiology Studies: DG UGI W SINGLE CM (SOL OR THIN BA)  Result Date: 04/02/2023 CLINICAL DATA:  Patient with previous gastric bypass in September of 2023. Recent coronavirus infection, discharged after inpatient treatment few days ago. Persistent nausea and vomiting. She states that the nausea and vomiting was present for a few weeks prior to the coronavirus infection. EXAM: UPPER GI SERIES WITH KUB TECHNIQUE: After obtaining a scout radiograph a routine upper GI series was performed using thin barium FLUOROSCOPY: Radiation Exposure Index (as provided by the fluoroscopic device): 2 minutes 30 seconds. COMPARISON:  CT 03/30/2023 FINDINGS: Preliminary KUB shows a normal bowel gas pattern. There is small amount of gas in the sigmoid colon region but no abnormal small bowel pattern. Patient drank thin barium. Oropharyngeal and cervical phase are normal without evidence of aspiration, penetration or upper esophageal structure. The thoracic esophagus does not show any fixed lesion. No hiatal hernia. Patient does demonstrate esophageal dysmotility with poor primary stripping and to and fro motion of barium with some disordered tertiary contractions. Barium tablet passes slowly through the region because of the dysmotility. There is no evidence of dilatation/achalasia, which has been reported to occur in rare instances following COVID. Small  postsurgical gastric pouch appears intact. No fistula demonstrated as was questioned by CT. Free flow into the efferent loop which is not dilated. No evidence of regional ulcer disease. IMPRESSION: 1. No evidence of fistula or obstruction. No evidence of ulcer disease. 2. Esophageal dysmotility with poor primary stripping and disordered tertiary contractions. Barium tablet passes slowly through the region because of the dysmotility. Electronically Signed   By: Paulina Fusi M.D.   On: 04/02/2023 10:11     LOS: 3 days   Lanae Boast, MD [facility]  04/02/2023, 12:35 PM

## 2023-04-02 NOTE — Progress Notes (Addendum)
A midline has been ordered for this patient. The [facility] RN has reviewed the patient's medical record including any arm restrictions, current creatinine clearance, length IV therapy is needed, and infusions needed/ordered to determine if a midline is the appropriate line for this individual patient. If there are contraindications, the physician and primary RN has been contacted by [facility] RN for further discussion. Midline Education: a midline is a long peripheral IV placed in the upper arm with the tip located at or near the axilla and distal to the shoulder should not be used as a CLABSI preventative measure   it has one lumen only it can remain in place for up to 29 days  Safe for Vancomycin infusion LESS THAN 6 days Is safe for power injection if good blood return can be obtained and line flushes easily it CANNOT be used for continuous infusion of vesicants. Including TPN and chemotherapy Should NOT be placed for sole intent of obtaining labs as there is no guarantee blood can be successfully drawn from line  contraindicated in patients with thrombosis, hypercoagulability, decreased venous flow to the extremities, ESRD (without a nephrologist's approval), small vessels, allergy to polyurethane, or known/suspected presence of a device-related infection, bacteremia, or septicemia  Midline placed R cephalic vein due to edema and pain in L upper arm.

## 2023-04-02 NOTE — Consult Note (Addendum)
Consultation  Referring Provider: Dr. 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 female with a past medical history as listed below including thyroid disease and prior Roux-en-Y gastric bypass, who presented to the ER on 03/30/2023 with chest pain.    At time of admission patient described having new onset fever 3 days ago as high as 103, admitted to the [facility] healthcare system with findings of COVID-19, treated with IV Remdesivir and discharged home on Paxlovid, unfortunately she had persistent nausea and could not eat.  Also describes ongoing headache.  Also history of Mounjaro started about a week ago which has since been stopped.  Patient started on Scopolamine patch and scheduled Zofran.    Today, patient found with her husband by her side who does assist with history.  She has a very complicated health history over the past year and a half or so it appears.  Apparently that a year ago she had a gastric bypass surgery.  She then had complications and eventually had her gallbladder out about 8 months ago now.  Most recently about 4 weeks ago she started [facility] and 3 weeks ago started with nausea and a couple of days later developed right eye pain found to have a corneal ulcer, this was treated with antibiotics.  She remained nauseous with a headache during that time and was told it was related to the ulcer.  Apparently the ulcer cleared up and she remained nauseous which increased to the point where she cannot hold down a meal over the past week and a half, she then developed a fever and was diagnosed with COVID about 5 days ago.  Tells me that she has had Phenergan suppositories which did not hold back her nausea.  Overnight she has had a scopolamine patch as  well as Compazine and scheduled Zofran.  She tells me after the Zofran she feels well for about 2 to 2 and half hours and during that time can take sips and eat some clear liquids, but then all of it comes rushing back.  She is worried about the fistula seen on imaging.  Tells me it was not there 8 months ago.  Her husband wonders if she has ulcers.  Associated symptoms include epigastric abdominal pain.  No recent bowel movement but the patient has not eaten solid foods in about 4 to 5 days.    Denies blood in her stool.  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  GI history: None  Past Medical History:  Diagnosis Date   Cancer (HCC)    Hypertension    Migraines    Thyroid disease     Past Surgical History:  Procedure Laterality Date   ABDOMINAL HYSTERECTOMY  2018   ANKLE SURGERY Left    has hardware   ANTERIOR FUSION CERVICAL SPINE  2008   APPENDECTOMY  1997   BREAST BIOPSY     BREAST BIOPSY Right 03/12/2023   MM RT BREAST BX W LOC DEV 1ST LESION IMAGE BX SPEC STEREO GUIDE 03/12/2023 [facility]   CHOLECYSTECTOMY     FRACTURE SURGERY  SHOULDER SURGERY Left    has pins   SPINE SURGERY      Family History  Problem Relation Age of Onset   Healthy Mother    Healthy Father     Social History   Tobacco Use   Smoking status: Former    Types: Cigarettes    Quit date: 2019    Years since quitting: 5.5   Smokeless tobacco: Never  Vaping Use   Vaping Use: Never used  Substance Use Topics   Alcohol use: Never   Drug use: Never    Prior to Admission medications   Medication Sig Start Date End Date Taking? Authorizing Provider  acetaminophen (TYLENOL) 325 MG tablet Take 3 tablets (975 mg total) by mouth every 6 (six) hours for 10 days. 03/28/23  Yes   benzonatate (TESSALON) 200 MG capsule Take 1 capsule (200 mg total) by mouth 3 (three) times daily as  needed for cough 03/28/23  Yes   butalbital-acetaminophen-caffeine (FIORICET) 50-325-40 MG tablet Take 1 tablet by mouth every 6 (six) hours as needed for headache. 03/09/23 03/08/24 Yes Triplett, Kasandra Knudsen, FNP  fluticasone (FLONASE) 50 MCG/ACT nasal spray Place 2 sprays into both nostrils once daily as needed for Rhinitis 03/28/23  Yes   levothyroxine (SYNTHROID) 150 MCG tablet Take 1 tablet (150 mcg total) by mouth daily. 02/12/23  Yes [name], NP  LORazepam (ATIVAN) 0.5 MG tablet Take 0.5 mg by mouth 3 (three) times daily as needed for anxiety.   Yes [provider]  meclizine (ANTIVERT) 25 MG tablet Take 1 tablet (25 mg total) by mouth 3 (three) times daily as needed for dizziness. 03/21/23  Yes Georga Hacking, MD  meclizine (ANTIVERT) 25 MG tablet Take 0.5 tablets (12.5 mg total) by mouth every 8 (eight) hours as needed. 03/28/23  Yes   ondansetron (ZOFRAN-ODT) 4 MG disintegrating tablet Take 1 tablet (4 mg total) by mouth every 8 (eight) hours as needed. 03/28/23  Yes   pantoprazole (PROTONIX) 40 MG tablet Take 1 tablet (40 mg total) by mouth daily. Patient taking differently: Take 40 mg by mouth 2 (two) times daily. 02/08/23  Yes [name], NP  prochlorperazine (COMPAZINE) 25 MG suppository Place 1 suppository (25 mg total) rectally every 12 (twelve) hours as needed for nausea 03/29/23  Yes Viviano Simas, FNP  promethazine (PHENERGAN) 25 MG tablet Take 1 tablet (25 mg total) by mouth every 8 (eight) hours as needed for nausea or vomiting. 03/19/23  Yes Lurene Shadow, MD  tirzepatide (ZEPBOUND) 2.5 MG/0.5ML Pen Inject 2.5 mg into the skin once a week.   Yes [provider]  cyclopentolate (CYCLODRYL,CYCLOGYL) 1 % ophthalmic solution Place 1 drop into the right eye 3 (three) times daily. 03/19/23   Lurene Shadow, MD  gatifloxacin (ZYMAXID) 0.5 % SOLN Place 1 drop into the right eye every hour. 03/19/23   Lurene Shadow, MD  HYDROcodone-acetaminophen (NORCO/VICODIN) 5-325 MG  tablet Take 1 tablet by mouth every 8 (eight) hours as needed for moderate pain. 03/19/23   Lurene Shadow, MD    Current Facility-Administered Medications  Medication Dose Route Frequency Provider Last Rate Last Admin   acetaminophen (TYLENOL) tablet 650 mg  650 mg Oral Q6H PRN Nolberto Hanlon, MD       Or   acetaminophen (TYLENOL) suppository 650 mg  650 mg Rectal Q6H PRN Nolberto Hanlon, MD   650 mg at 03/31/23 1807   dextrose 5 % in lactated ringers infusion   Intravenous Continuous Nolberto Hanlon, MD 100  mL/hr at 04/02/23 0100 New Bag at 04/02/23 0100   enoxaparin (LOVENOX) injection 40 mg  40 mg Subcutaneous Q24H Nolberto Hanlon, MD   40 mg at 04/02/23 0808   guaiFENesin-dextromethorphan (ROBITUSSIN DM) 100-10 MG/5ML syrup 5 mL  5 mL Oral Q4H PRN Reva Bores, MD   5 mL at 04/01/23 2220   HYDROmorphone (DILAUDID) injection 0.5 mg  0.5 mg Intravenous Q3H PRN Nolberto Hanlon, MD   0.5 mg at 04/02/23 0814   [START ON 04/06/2023] levothyroxine (SYNTHROID, LEVOTHROID) injection 112 mcg  112 mcg Intravenous Daily Nolberto Hanlon, MD       ondansetron [facility]) tablet 4 mg  4 mg Oral Q4H Reva Bores, MD       Or   ondansetron [facility]) injection 4 mg  4 mg Intravenous Q4H Reva Bores, MD   4 mg at 04/02/23 1610   Oral care mouth rinse  15 mL Mouth Rinse PRN Reva Bores, MD       pantoprazole (PROTONIX) injection 40 mg  40 mg Intravenous Conchita Paris, MD   40 mg at 04/01/23 2220   polyethylene glycol (MIRALAX / GLYCOLAX) packet 17 g  17 g Oral Daily PRN Nolberto Hanlon, MD       prochlorperazine (COMPAZINE) injection 10 mg  10 mg Intravenous Q6H Reva Bores, MD   10 mg at 04/01/23 2227   scopolamine (TRANSDERM-SCOP) 1 MG/3DAYS 1.5 mg  1 patch Transdermal Q72H Reva Bores, MD   1.5 mg at 03/31/23 1420   sodium chloride flush (NS) 0.9 % injection 3 mL  3 mL Intravenous Q12H Nolberto Hanlon, MD   3 mL at 04/01/23 2226    Allergies as of 03/30/2023 - Review Complete 03/30/2023  Allergen Reaction Noted    Aspirin Swelling and Anaphylaxis 03/21/2012   Morphine Anaphylaxis and Swelling 04/20/2021     Review of Systems:    Constitutional: No fever or chills Skin: No rash or itching Cardiovascular: No chest pain  Respiratory: No SOB Gastrointestinal: See HPI and otherwise negative Genitourinary: No dysuria  Neurological: + headache Musculoskeletal: No new muscle or joint pain Hematologic: No bleeding  Psychiatric: No history of depression or anxiety    Physical Exam:  Vital signs in last 24 hours: Temp:  [98 F (36.7 C)-98.3 F (36.8 C)] 98.3 F (36.8 C) (07/08 0251) Pulse Rate:  [66-88] 66 (07/08 0251) Resp:  [17-18] 17 (07/07 1928) BP: (119-131)/(80-92) 119/80 (07/08 0251) SpO2:  [95 %-100 %] 96 % (07/08 0251) Last BM Date : 03/30/23 General:   Pleasant Caucasian female appears to be in NAD, Well developed, Well nourished, alert and cooperative Head:  Normocephalic and atraumatic. Eyes:   PEERL, EOMI. No icterus. Conjunctiva pink. Ears:  Normal auditory acuity. Neck:  Supple Throat: Oral cavity and pharynx without inflammation, swelling or lesion. Teeth in good condition. Lungs: Respirations even and unlabored. Lungs clear to auscultation bilaterally.   No wheezes, crackles, or rhonchi.  Heart: Normal S1, S2. No MRG. Regular rate and rhythm. No peripheral edema, cyanosis or pallor.  Abdomen:  Soft, nondistended, mild epigastric ttp, No rebound or guarding. Normal bowel sounds. No appreciable masses or hepatomegaly. Rectal:  Not performed.  Msk:  Symmetrical without gross deformities. Peripheral pulses intact.  Extremities:  Without edema, no deformity or joint abnormality.  Neurologic:  Alert and  oriented x4;  grossly normal neurologically.  Skin:   Dry and intact without significant lesions or rashes. Psychiatric: Demonstrates good judgement and reason without abnormal  affect or behaviors.   LAB RESULTS: Recent Labs    03/30/23 2035  WBC 5.2  HGB 13.1  HCT 41.1  PLT  248   BMET Recent Labs    03/30/23 2035  CREATININE 0.73   PT/INR Recent Labs    03/31/23 0328  LABPROT 14.4  INR 1.1     Impression / Plan:   Impression: 1.  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  Headache 3.  Eye pain: Right corneal ulcer/scarring-apparently healed per patient 4.  COVID-19: Active infection, diagnosed 5 days ago  Plan: 1.  Discussed with patient and her family that I am not sure an urgent/emergent EGD in the setting of her active COVID-19 infection is necessary.  Likely if she continues with nausea and vomiting regardless of medications we could pursue this or if she continues with these things after her COVID-19 is better. 2.  For now allow clear liquids/advance diet as tolerated 3.  Will await results from upper GI study today. 4. Agree with Pantoprazole 40mg  IV BID  Thank you for your kind consultation, we will continue to follow.  Violet Baldy [facility]  04/02/2023, 8:52 AM    Attending physician's note   I have taken history, reviewed the chart and examined the patient. I performed a substantive portion of this encounter, including complete performance of at least one of the key components, in conjunction with the APP. I agree with the Advanced Practitioner's note, impression and recommendations.   N/V- likely related to [facility].  Although CT (on adm) shows ?GG fistula. Neg UGI series. Hence, doubt if she has GG fistula.  Acute COVID-19 could be contributing  H/O prior lap RYGB 2022. Adm 04/2022 with N/V/RUQ pain-extensive neg WU @ [facility] including CT enterography, [place], EGD, MRCP, HIDA.  Underwent Dx laparoscopy at [facility] 05/26/2022 and rpt IntraOp EGD with repair of internal hernia,  cholecystectomy. No HH, marginal ulcer or GG fistula noted.  Plan: -Clear liquid diet.  Advance as tolerated -IV Protonix -Hold Mounjaro -Consider Reglan 10mg  IV or PO Q8hrs x 2 weeks (pharmacy to check for any interactions) -Consider EGD and CTE as outpt. -Recommend FU at [facility] bariatric clinic    Edman Circle, MD [facility] [phone]

## 2023-04-03 DIAGNOSIS — K5900 Constipation, unspecified: Secondary | ICD-10-CM

## 2023-04-03 DIAGNOSIS — K5710 Diverticulosis of small intestine without perforation or abscess without bleeding: Secondary | ICD-10-CM

## 2023-04-03 DIAGNOSIS — R112 Nausea with vomiting, unspecified: Secondary | ICD-10-CM | POA: Diagnosis not present

## 2023-04-03 DIAGNOSIS — R110 Nausea: Secondary | ICD-10-CM | POA: Diagnosis not present

## 2023-04-03 DIAGNOSIS — R519 Headache, unspecified: Secondary | ICD-10-CM | POA: Diagnosis not present

## 2023-04-03 LAB — ACTH STIMULATION, 3 TIME POINTS
Cortisol, 30 Min: 20.3 ug/dL
Cortisol, 60 Min: 24.6 ug/dL
Cortisol, Base: 3.9 ug/dL

## 2023-04-03 LAB — CULTURE, BLOOD (ROUTINE X 2)

## 2023-04-03 MED ORDER — CHLORHEXIDINE GLUCONATE CLOTH 2 % EX PADS
6.0000 | MEDICATED_PAD | Freq: Every day | CUTANEOUS | Status: DC
  Administered 2023-04-03 – 2023-04-09 (×7): 6 via TOPICAL

## 2023-04-03 MED ORDER — METOCLOPRAMIDE HCL 5 MG/ML IJ SOLN
10.0000 mg | Freq: Three times a day (TID) | INTRAMUSCULAR | Status: DC
  Administered 2023-04-03 – 2023-04-09 (×18): 10 mg via INTRAVENOUS
  Filled 2023-04-03 (×18): qty 2

## 2023-04-03 MED ORDER — SODIUM CHLORIDE 0.9% FLUSH
10.0000 mL | Freq: Two times a day (BID) | INTRAVENOUS | Status: DC
  Administered 2023-04-05 – 2023-04-06 (×4): 10 mL
  Administered 2023-04-07: 40 mL
  Administered 2023-04-07 – 2023-04-09 (×3): 10 mL

## 2023-04-03 NOTE — Progress Notes (Signed)
PROGRESS NOTE Hannah Ballard  UJW:[id number] DOB: 12-Jul-1981 DOA: 03/30/2023 PCP: [name], NP  Brief Narrative/Hospital Course: 42 y.o.f w/ diagnosis of right-sided knee laceration about 3 weeks ago.This was associated with headache and vision changes.  This has been managed by outpatient ophthalmology, unfortunately patient has continued to have right-sided eye pain/headache associated with vision loss in the site.Per report from the patient, she has been advised by ophthalmology to wait  overtime to see if her vision improves. Patient started having new onset fever approximately 3 days PTA upto 103 at home.She was admitted to the [facility] healthcare system with finding of COVID-19 positivity.  Patient was treated with intravenous remdesivir, and discharged home on Paxlovid.  Patient has been home last 2 days PTA, she has had persistent nausea and on any attempts to eat vomiting w/ associated headache when patient vomits.However much milder headache otherwise.  There is no abdominal pain no diarrhea. She presented to the ED with vomiting and headache. In the ED vitals stable.  Lites and labs fairly unremarkable.  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 Patient failed trial of oral diet and admission was requested     Subjective: Seen and examined this morning Overnight afebrile BP stable not hypoxic Afebrile and vitals stable Every day she is feeling slightly better-, able to tolerate popsicle, needing Zofran, some vomiting Some blurry vision on the right chronic unchanged, no new dizziness focal weakness numbness tingling or double vision    Assessment and Plan: Principal Problem:   Nausea Active Problems:   Headache   Acute right eye pain   COVID-19  virus infection   Vomiting   Dehydration   Intractable nausea and vomiting Dehydration Esophageal dysmotility: Started 2 weeks ago after restarting Mounjaro 4 weeks ago and also recently on antibiotics and had COVID infection.  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 As per GI no plan for emergent endoscopy while hospitalized, likely multifactorial related to [facility] [place], COVID infection, negative upper GI series other than gastroparesis.  Advised PPI twice daily, Reglan every 8 hours x 2 weeks trial, pharmacy on consult for diet, to consider EGD and CT as outpatient and follow-up at [facility] bariatric clinic Continue hydrations, supportive care prn Dilaudid, meclizine  Headache: Reports headache is better.  MRI head without contrast unremarkable on 7/5  Acute right eye pain/recent right corneal ulcer: Reports ulcer has healed, having some diminished vision for some time and being followed by outpatient ophthalmology and currently no new issues  COVID-19 virus infection: Initially diagnosed on July 2 at [facility] and managed with remdesivir discharged on Paxlovid but unable to tolerate-initially received remdesivir but due to lack of respiratory symptoms has been discontinued Monitoring continue supportive care  Low cortisol level:cosyntropin test ordered and in process 04/03/23 am  Abnormal thyroid labs >Tsh HIGH-but normal free T4 likely in the setting of acute illness advised to recheck TSH in 3 to 4 weeks  Difficult IV access-midline has been placed 7/8  Class I Obesity:Patient's Body mass index is 34.12 kg/m. : Will benefit with PCP follow-up, weight loss  healthy lifestyle and outpatient sleep evaluation.   DVT  prophylaxis: enoxaparin (LOVENOX) injection 40 mg Start:  03/31/23 0800 SCDs Start: 03/30/23 1908 Code Status:   Code Status: Full Code Family Communication: plan of care discussed with patient at bedside. Patient status is: Inpatient because of intractable nausea and vomiting Level of care: Med-Surg   Dispo: The patient is from: home            Anticipated disposition: home tbd Objective: Vitals last 24 hrs: Vitals:   04/02/23 1320 04/02/23 1935 04/02/23 2332 04/03/23 0305  BP: 125/85 134/83 135/88 125/86  Pulse: 71 68 74 68  Resp:  18 20 18   Temp: 98.1 F (36.7 C) 98 F (36.7 C)  98.1 F (36.7 C)  TempSrc: Oral Oral  Oral  SpO2: 94% 95% 98% 96%  Weight:      Height:       Weight change:   Physical Examination: General exam: alert awake, oriented at baseline, older than stated age HEENT:Oral mucosa moist, Ear/Nose WNL grossly Respiratory system: Bilaterally clear BS,no use of accessory muscle Cardiovascular system: S1 & S2 +, No JVD. Gastrointestinal system: Abdomen soft,NT,ND, BS+ Nervous System: Alert, awake, moving all extremities,and following commands. Extremities: LE edema neg,distal peripheral pulses palpable and warm.  Skin: No rashes,no icterus. MSK: Normal muscle bulk,tone, power   Medications reviewed:  Scheduled Meds:  Chlorhexidine Gluconate Cloth  6 each Topical Daily   enoxaparin (LOVENOX) injection  40 mg Subcutaneous Q24H   levothyroxine  100 mcg Intravenous Daily   metoCLOPramide (REGLAN) injection  10 mg Intravenous Q8H   ondansetron  4 mg Oral Q4H   Or   ondansetron (ZOFRAN) IV  4 mg Intravenous Q4H   pantoprazole (PROTONIX) IV  40 mg Intravenous Q12H   prochlorperazine  10 mg Intravenous Q6H   scopolamine  1 patch Transdermal Q72H   sodium chloride flush  10-40 mL Intracatheter Q12H   sodium chloride flush  3 mL Intravenous Q12H   Continuous Infusions:  lactated ringers 75 mL/hr at 04/03/23 0252      Diet Order             Diet clear liquid Room service appropriate? Yes; Fluid consistency:  Thin  Diet effective now                  Intake/Output Summary (Last 24 hours) at 04/03/2023 1154 Last data filed at 04/03/2023 0252 Gross per 24 hour  Intake 374.33 ml  Output --  Net 374.33 ml   Net IO Since Admission: 6,608.26 mL [04/03/23 1154]  Wt Readings from Last 3 Encounters:  03/30/23 104.8 kg  03/16/23 104.8 kg  03/16/23 104.8 kg     Unresulted Labs (From admission, onward)     Start     Ordered   04/06/23 0500  Creatinine, serum  (enoxaparin (LOVENOX)    CrCl >/= 30 ml/min)  Weekly,   R     Comments: while on enoxaparin therapy    03/30/23 1907   04/03/23 0500  ACTH stimulation, 3 time points  (cosyntropin (CORTROSYN) test and labs)  Tomorrow morning,   R       Comments: For inpatient lab collection, please call lab to coordinate timing of collections with phlebotomy PRIOR to administering cosyntropin (CORTROSYN).   Question:  Specimen collection method  Answer:  Lab=Lab collect   04/02/23 1217          Data Reviewed: I have personally reviewed following labs and imaging studies CBC: Recent Labs  Lab 03/29/23 1956 03/30/23 0735 03/30/23 2035  WBC 4.9 5.6 5.2  NEUTROABS 2.5 3.3  --   HGB 13.1 13.7 13.1  HCT 39.6 40.8 41.1  MCV 93.8 94.0 97.9  PLT 271 283 248   Basic Metabolic Panel: Recent Labs  Lab 03/29/23 1956 03/30/23 0735 03/30/23 2035  NA 140 140  --   K 3.4* 3.6  --   CL 104 105  --   CO2 26 23  --   GLUCOSE 90 92  --   BUN 17 17  --   CREATININE 0.96 0.80 0.73  CALCIUM 8.7* 8.6*  --    GFR: Estimated Creatinine Clearance: 119.2 mL/min (by C-G formula based on SCr of 0.73 mg/dL). Liver Function Tests: Recent Labs  Lab 03/29/23 1956 03/30/23 0735  AST 15 17  ALT 24 24  ALKPHOS 77 79  BILITOT 0.4 0.5  PROT 7.1 7.6  ALBUMIN 4.3 4.6   Recent Labs  Lab 03/29/23 1956 03/30/23 0735  LIPASE 17 18   No results for input(s): "AMMONIA" in the last 168 hours. Coagulation Profile: Recent Labs  Lab 03/31/23 0328  INR 1.1    Recent Labs    04/01/23 1758 04/02/23 0441  TSH 41.514*  --   FREET4  --  0.83   Sepsis Labs: No results for input(s): "PROCALCITON", "LATICACIDVEN" in the last 168 hours.  Recent Results (from the past 240 hour(s))  Culture, blood (Routine X 2) w Reflex to ID Panel     Status: None (Preliminary result)   Collection Time: 03/30/23  8:35 PM   Specimen: BLOOD  Result Value Ref Range Status   Specimen Description   Final    BLOOD Performed at [facility], [address] [place], [place] [address]    Special Requests   Final    Blood Culture adequate volume BOTTLES DRAWN AEROBIC AND ANAEROBIC Performed at [facility], [address], [place], [place] [address]    Culture   Final    NO GROWTH 4 DAYS Performed at [facility], [address], [place], [place] [address]    Report Status PENDING  Incomplete  Culture, blood (Routine X 2) w Reflex to ID Panel     Status: None (Preliminary result)   Collection Time: 03/30/23  8:35 PM   Specimen: BLOOD  Result Value Ref Range Status   Specimen Description   Final    BLOOD Performed at [facility], [address], [place], [place] [address]    Special Requests   Final    Blood Culture adequate volume BOTTLES DRAWN AEROBIC AND ANAEROBIC Performed at [facility], [address] [place], [place] [address]    Culture   Final    NO GROWTH 4 DAYS Performed at [facility], [address] [place], [place] [address]    Report Status PENDING  Incomplete    Antimicrobials: Anti-infectives (From admission, onward)    Start     Dose/Rate Route Frequency Ordered Stop   03/31/23 1000  remdesivir 100 mg in sodium chloride 0.9 % 100 mL IVPB  Status:  Discontinued       See Hyperspace for full Linked Orders Report.   100 mg 200 mL/hr over 30 Minutes Intravenous Daily 03/30/23 1837 03/31/23 1357   03/30/23 2000  remdesivir 200 mg in sodium  chloride 0.9% 250 mL IVPB       See Hyperspace for full Linked Orders Report.   200 mg 580 mL/hr over 30  Minutes Intravenous Once 03/30/23 1837 03/30/23 2355      Culture/Microbiology    Component Value Date/Time   SDES  03/30/2023 2035    BLOOD Performed at [facility], [address], [place], [place] [address]    SDES  03/30/2023 2035    BLOOD Performed at [facility], [address] [place], [place] [address]    SPECREQUEST  03/30/2023 2035    Blood Culture adequate volume BOTTLES DRAWN AEROBIC AND ANAEROBIC Performed at [facility], [address], [place], [place] [address]    SPECREQUEST  03/30/2023 2035    Blood Culture adequate volume BOTTLES DRAWN AEROBIC AND ANAEROBIC Performed at [facility], [address] [place], [place] [address]    CULT  03/30/2023 2035    NO GROWTH 4 DAYS Performed at [facility], [address], [place], [place] [address]    CULT  03/30/2023 2035    NO GROWTH 4 DAYS Performed at [facility], [address] [place], [place] [address]    REPTSTATUS PENDING 03/30/2023 2035   REPTSTATUS PENDING 03/30/2023 2035  Other culture-see note  Radiology Studies: DG UGI W SINGLE CM (SOL OR THIN BA)  Result Date: 04/02/2023 CLINICAL DATA:  Patient with previous gastric bypass in September of 2023. Recent coronavirus infection, discharged after inpatient treatment few days ago. Persistent nausea and vomiting. She states that the nausea and vomiting was present for a few weeks prior to the coronavirus infection. EXAM: UPPER GI SERIES WITH KUB TECHNIQUE: After obtaining a scout radiograph a routine upper GI series was performed using thin barium FLUOROSCOPY: Radiation Exposure Index (as provided by the fluoroscopic device): 2 minutes 30 seconds. COMPARISON:  CT 03/30/2023 FINDINGS: Preliminary KUB shows a normal bowel gas pattern. There is small amount of gas in  the sigmoid colon region but no abnormal small bowel pattern. Patient drank thin barium. Oropharyngeal and cervical phase are normal without evidence of aspiration, penetration or upper esophageal structure. The thoracic esophagus does not show any fixed lesion. No hiatal hernia. Patient does demonstrate esophageal dysmotility with poor primary stripping and to and fro motion of barium with some disordered tertiary contractions. Barium tablet passes slowly through the region because of the dysmotility. There is no evidence of dilatation/achalasia, which has been reported to occur in rare instances following COVID. Small postsurgical gastric pouch appears intact. No fistula demonstrated as was questioned by CT. Free flow into the efferent loop which is not dilated. No evidence of regional ulcer disease. IMPRESSION: 1. No evidence of fistula or obstruction. No evidence of ulcer disease. 2. Esophageal dysmotility with poor primary stripping and disordered tertiary contractions. Barium tablet passes slowly through the region because of the dysmotility. Electronically Signed   By: Paulina Fusi M.D.   On: 04/02/2023 10:11     LOS: 4 days   Lanae Boast, MD [facility]  04/03/2023, 11:54 AM

## 2023-04-03 NOTE — Progress Notes (Addendum)
Progress Note   Subjective  Chief Complaint: Nausea and vomiting  Today, patient tells me that she is maybe slightly better, every day she feels just barely better than the day before.  Still able to tolerate popsicles excetra after just having had her Zofran.  She has not received Reglan yet.  She and her husband are both confused about why this is all going on still and trying to come up with reasoning.  She also continues with a headache.  Interestingly she tells me that yesterday she had trouble when moving her head and that it would make her somewhat dizzy and more nauseous (?  Vertigo symptoms).  This was different from her normal nausea and headaches before.  No bowel movement in the past 3 to 4 days and patient tells me now no passage of gas over the last 24 hours.  No abdominal pain though.   Objective   Vital signs in last 24 hours: Temp:  [98 F (36.7 C)-98.1 F (36.7 C)] 98.1 F (36.7 C) (07/09 0305) Pulse Rate:  [68-74] 68 (07/09 0305) Resp:  [18-20] 18 (07/09 0305) BP: (125-135)/(83-88) 125/86 (07/09 0305) SpO2:  [94 %-98 %] 96 % (07/09 0305) Last BM Date : 03/30/23 General:    white female in NAD Heart:  Regular rate and rhythm; no murmurs Lungs: Respirations even and unlabored, lungs CTA bilaterally Abdomen:  Soft, nontender and nondistended.  Slightly decreased bowel sounds all 4 quadrants. Psych:  Cooperative. Normal mood and affect.  Intake/Output from previous day: 07/08 0701 - 07/09 0700 In: 374.3 [P.O.:240; I.V.:134.3] Out: -    Studies/Results: DG UGI W SINGLE CM (SOL OR THIN BA)  Result Date: 04/02/2023 CLINICAL DATA:  Patient with previous gastric bypass in September of 2023. Recent coronavirus infection, discharged after inpatient treatment few days ago. Persistent nausea and vomiting. She states that the nausea and vomiting was present for a few weeks prior to the coronavirus infection. EXAM: UPPER GI SERIES WITH KUB TECHNIQUE: After obtaining a scout  radiograph a routine upper GI series was performed using thin barium FLUOROSCOPY: Radiation Exposure Index (as provided by the fluoroscopic device): 2 minutes 30 seconds. COMPARISON:  CT 03/30/2023 FINDINGS: Preliminary KUB shows a normal bowel gas pattern. There is small amount of gas in the sigmoid colon region but no abnormal small bowel pattern. Patient drank thin barium. Oropharyngeal and cervical phase are normal without evidence of aspiration, penetration or upper esophageal structure. The thoracic esophagus does not show any fixed lesion. No hiatal hernia. Patient does demonstrate esophageal dysmotility with poor primary stripping and to and fro motion of barium with some disordered tertiary contractions. Barium tablet passes slowly through the region because of the dysmotility. There is no evidence of dilatation/achalasia, which has been reported to occur in rare instances following COVID. Small postsurgical gastric pouch appears intact. No fistula demonstrated as was questioned by CT. Free flow into the efferent loop which is not dilated. No evidence of regional ulcer disease. IMPRESSION: 1. No evidence of fistula or obstruction. No evidence of ulcer disease. 2. Esophageal dysmotility with poor primary stripping and disordered tertiary contractions. Barium tablet passes slowly through the region because of the dysmotility. Electronically Signed   By: Paulina Fusi M.D.   On: 04/02/2023 10:11     Assessment / Plan:   Assessment: 1.  Nausea and vomiting: Again thought related to [facility] +/- COVID-19 contributing, negative upper GI series other than gastroparesis, regardless of CT with question of GG fistula 2.  Headache 3.  Eye pain 4.  COVID-19: Diagnosed 6 days ago  Plan: 1.  No plans for emergent EGD while patient is hospitalized.  Again multiple reasons for symptoms, now with addition of possible vertigo. 2.  Continue clear liquid diet and advance as tolerated 3.  Continue Protonix 40 mg IV  twice daily.  Discussed with patient and her husband that this is the same medicine she would be on if we found an ulcer. 4.  Again consider Reglan 10 mg IV or p.o. every 8 hours x 2 weeks-I have placed a pharmacy consult to see if this would be okay for her. 5.  Consider EGD and CTE as outpatient 6.  Recommend patient follow-up at [facility] bariatric clinic at discharge  Thank you for your kind consultation, we will continue to follow.   LOS: 4 days   Unk Lightning  04/03/2023, 9:30 AM  Addendum: Per pharmacy consult there are no contraindications to Reglan, will start 10 mg IV every 8 hours.  Hyacinth Meeker, PA-C    Attending physician's note   I have taken history, reviewed the chart and examined the patient. I performed a substantive portion of this encounter, including complete performance of at least one of the key components, in conjunction with the APP. I agree with the Advanced Practitioner's note, impression and recommendations.   N/V- likely related to [facility], acute Covid-19, assoc gastroparesis or functional.  Although CT (on adm) shows ?GG fistula. Neg UGI series. Hence, doubt if she has GG fistula. On Zofran   H/O prior lap RYGB 2022. Adm 04/2022 with N/V/RUQ pain-extensive neg WU @ [facility] including CT enterography, [place], EGD, MRCP, HIDA.  Underwent Dx laparoscopy at [facility] 05/26/2022 and rpt IntraOp EGD with repair of internal hernia, cholecystectomy. No HH, marginal ulcer or GG fistula noted.   Plan: -Full liquid diet.  Advance as tolerated -IV Protonix/Zofran/prochlorperazine/scopolamine.  Added Reglan -Hold Mounjaro -No plans for endoscopic procedures this admission given neg UGI series, prev EGD x 2 and CTE.  -Can always consider repeating EGD/CTA as outpt -Recommend FU at [facility] bariatric clinic -Minimize narcotics.   Edman Circle, MD [facility] [phone]

## 2023-04-04 DIAGNOSIS — R519 Headache, unspecified: Secondary | ICD-10-CM | POA: Diagnosis not present

## 2023-04-04 DIAGNOSIS — R112 Nausea with vomiting, unspecified: Secondary | ICD-10-CM | POA: Diagnosis not present

## 2023-04-04 DIAGNOSIS — K5710 Diverticulosis of small intestine without perforation or abscess without bleeding: Secondary | ICD-10-CM | POA: Diagnosis not present

## 2023-04-04 DIAGNOSIS — K5900 Constipation, unspecified: Secondary | ICD-10-CM | POA: Diagnosis not present

## 2023-04-04 DIAGNOSIS — R110 Nausea: Secondary | ICD-10-CM | POA: Diagnosis not present

## 2023-04-04 LAB — CULTURE, BLOOD (ROUTINE X 2)

## 2023-04-04 MED ORDER — POLYETHYLENE GLYCOL 3350 17 G PO PACK
17.0000 g | PACK | Freq: Every day | ORAL | Status: DC
  Administered 2023-04-04 – 2023-04-07 (×4): 17 g via ORAL
  Filled 2023-04-04 (×6): qty 1

## 2023-04-04 MED ORDER — ONDANSETRON 4 MG PO TBDP
4.0000 mg | ORAL_TABLET | ORAL | Status: DC
  Administered 2023-04-04 – 2023-04-09 (×25): 4 mg via ORAL
  Filled 2023-04-04 (×24): qty 1

## 2023-04-04 MED ORDER — ONDANSETRON HCL 4 MG/2ML IJ SOLN
4.0000 mg | INTRAMUSCULAR | Status: DC
  Administered 2023-04-06 – 2023-04-07 (×2): 4 mg via INTRAVENOUS
  Filled 2023-04-04 (×3): qty 2

## 2023-04-04 MED ORDER — BISACODYL 5 MG PO TBEC: 5.0000 mg | DELAYED_RELEASE_TABLET | Freq: Every day | ORAL | Status: DC | PRN

## 2023-04-04 MED ORDER — HYOSCYAMINE SULFATE 0.125 MG SL SUBL: 0.1250 mg | SUBLINGUAL_TABLET | SUBLINGUAL | Status: DC | PRN

## 2023-04-04 MED ORDER — ONDANSETRON 4 MG PO TBDP
4.0000 mg | ORAL_TABLET | Freq: Three times a day (TID) | ORAL | Status: DC | PRN
  Filled 2023-04-04: qty 1

## 2023-04-04 NOTE — Progress Notes (Addendum)
Progress Note Primary Gastroenterologist: [facility]       Subjective  Chief Complaint: Nausea and vomiting  Patient lying in bed, states she continues to have nausea, vomiting headache and dizziness. Patient states she had vomiting last night 11 PM and again 5 AM this morning.  She has continuous nausea and dry heaving, this morning had lime green bile.  Can also have abdominal spasming with dry heaving after eating.  Patient states she can sometimes eat small italian ice and water after Zofran immediately.  States that she has not felt a difference with the Reglan that she has had. Patient continues to have headache and had to have IV Dilaudid last night which has helped, has had some dizziness with walking and turning her head.    Objective   Vital signs in last 24 hours: Temp:  [97.8 F (36.6 C)-98 F (36.7 C)] 97.8 F (36.6 C) (07/10 0440) Pulse Rate:  [66-78] 66 (07/10 0440) Resp:  [16-20] 16 (07/10 0440) BP: (120-137)/(82-91) 137/91 (07/10 0440) SpO2:  [95 %-100 %] 95 % (07/10 0440) Last BM Date : 03/30/23 General:    white female in NAD Heart:  Regular rate and rhythm; no murmurs Lungs: Respirations even and unlabored, lungs CTA bilaterally Abdomen:  Soft, left upper quadrant left lower quadrant abdominal tenderness, no rebound and nondistended.  Slightly decreased bowel sounds all 4 quadrants. Psych:  Cooperative. Normal mood and affect.  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 Recent coronavirus infection, discharged after inpatient treatment few days ago. Persistent nausea and vomiting. She states that the nausea and vomiting was present for a few weeks prior to the coronavirus infection. EXAM: UPPER GI SERIES WITH KUB TECHNIQUE: After obtaining a  scout radiograph a routine upper GI series was performed using thin barium FLUOROSCOPY: Radiation Exposure Index (as provided by the fluoroscopic device): 2 minutes 30 seconds. COMPARISON:  CT 03/30/2023 FINDINGS: Preliminary KUB shows a normal bowel gas pattern. There is small amount of gas in the sigmoid colon region but no abnormal small bowel pattern. Patient drank thin barium. Oropharyngeal and cervical phase are normal without evidence of aspiration, penetration or upper esophageal structure. The thoracic esophagus does not show any fixed lesion. No hiatal hernia. Patient does demonstrate esophageal dysmotility with poor primary stripping and to and fro motion of barium with some disordered tertiary contractions. Barium tablet passes slowly through the region because of the dysmotility. There is no evidence of dilatation/achalasia, which has been reported to occur in rare instances following COVID. Small postsurgical gastric pouch appears intact. No fistula demonstrated as was questioned by CT. Free flow into the efferent loop which is not dilated. No evidence of regional ulcer disease. IMPRESSION: 1. No evidence of fistula or obstruction. No evidence of ulcer disease. 2. Esophageal dysmotility with poor primary stripping and disordered tertiary contractions. Barium tablet passes slowly through the region because of the dysmotility. Electronically Signed   By: Paulina Fusi M.D.   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  05/2022 diagnostic laparoscopy [facility] repair of internal hernia, cholecystectomy, no hiatal hernia marginal ulcer no GG fistula.  Presented with  fever, nausea vomiting acutely.    Assessment / Plan:     Nausea and vomiting:  Possible related to [facility] +/- COVID-19 +/- Migraine/ Vertigo Negative UGI  series other than gastroparesis, no signs of GG fistula seen on CT -[facility] -No plans for endoscopic procedures this admission -Continue to hold Mounjaro -Diet full liquid diet, advance as tolerated. -Continue supportive care with IV Protonix BID, Zofran q 5 hours, scopolamine -Reglan started yesterday, has had 3 doses 10 mg every 8 hours, continue for now -Patient's had 2 doses of IV Dilaudid earlier this morning, has not had any p.o. oxycodone-try to minimize narcotics - add on levsin for AB cramping -Can consider repeating EGD/CTA outpatient -Recommend follow-up [facility] bariatric clinic  Constipation Last bowel movement 5 days ago Could be due to decreased p.o. intake Some left upper and lower abdominal discomfort, possibly from constipation Add MiraLAX daily, add Levsin as needed for abdominal/stomach cramping Consider KUB Consider Motegrity for constipation/can help gastroparesis  Headache Eye pain COVID-19: Diagnosed 6 days ago History of Roux en Y 2022    LOS: 5 days   Doree Albee  04/04/2023, 8:33 AM      Attending physician's note   I have taken history, reviewed the chart and examined the patient. I performed a substantive portion of this encounter, including complete performance of at least one of the key components, in conjunction with the APP. I agree with the Advanced Practitioner's note, impression and recommendations.   N/V- likely related to [facility], acute Covid-19, assoc gastroparesis or functional or migraine/vertigo.  Although CT (on adm) shows ?GG fistula. Neg UGI series. Hence, doubt if she has GG fistula. On Zofran   H/O prior lap RYGB 2022. Adm 04/2022 with N/V/RUQ pain-extensive neg WU @ [facility] including CT enterography, [place], EGD, MRCP, HIDA.  Underwent Dx laparoscopy at [facility] 05/26/2022 and rpt IntraOp EGD with repair of internal hernia, cholecystectomy. No HH, marginal ulcer or GG fistula  noted.  Constipation.   Plan: -Advance diet as tolerated. -Add MiraLAX. -IV Protonix/Zofran/prochlorperazine/scopolamine/ Reglan -Hold Mounjaro -No plans for endoscopic procedures this admission given neg UGI series, prev EGD x 2 and CTE.  -Can always consider repeating EGD/CTA as outpt -Recommend FU at [facility] bariatric clinic -Minimize narcotics. -Will sign off for now   Edman Circle, MD [facility] [phone]

## 2023-04-04 NOTE — Progress Notes (Signed)
PROGRESS NOTE    Hannah Ballard  DGU:[id number] DOB: 16-Jul-1981 DOA: 03/30/2023 PCP: [name], NP   Brief Narrative: 42 year old female with history of [name] admitted with fever nausea and vomiting abdominal cramping after diagnosis of COVID.  Patient was initially admitted to [facility] with COVID treated with remdesivir and discharged home on p.o. Paxlovid.  She gets readmitted here with complaints of intractable nausea vomiting and abdominal cramps.  His CT of the abdomen and pelvis did not reveal any acute findings MRI of the brain shows inflammation of the paranasal sinuses which is worsened.  Assessment & Plan:   Principal Problem:   Nausea Active Problems:   Headache   Acute right eye pain   COVID-19 virus infection   Vomiting   Dehydration   #1 intractable nausea vomiting dehydration-which probably is multifactorial in the setting of COVID and Mounjaro use.  Patient was started on Mounjaro 4 weeks prior to admission, 2 weeks ago patient started with nausea and vomiting.  Patient still continues to be nauseous and vomited this morning.  She has been n.p.o. on IV fluids. 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 Upper GI series shows gastroparesis.  Continue PPI Reglan.  EGD as an outpatient.  Continue IV fluids pain control and supportive care. \Patient followed at [facility] for bariatric surgery.  She is not able to tolerate a diet today.  #2 constipation though she reports she has not been eating she has not had a bowel movement for over 5 days.  Noted GI added MiraLAX and Levsin.  #3 Headache: Reports headache is better. ?  Sinus inflammation noted on MRI will add Flonase  # 4 Acute right eye pain/recent right corneal ulcer: Reports ulcer has healed, having some diminished vision for some time and being followed by outpatient ophthalmology and currently no new issues   # 5 COVID-19 virus infection: Initially  diagnosed on July 2 at [facility] and managed with remdesivir discharged on Paxlovid but unable to tolerate-initially received remdesivir but due to lack of respiratory symptoms has been discontinued Monitoring continue supportive care   #6 Low cortisol level: Cortisol 30 minutes and 60 minutes random is normal.   #7 Abnormal thyroid labs >Tsh HIGH-but normal free T4 likely in the setting of acute illness advised to recheck TSH in 3 to 4 weeks   Difficult IV access-midline has been placed 7/8   Class I Obesity:Patient's Body mass index is 34.12 kg/m. : Will benefit with PCP follow-up, weight loss  healthy lifestyle and outpatient sleep evaluation.     DVT prophylaxis: enoxaparin  SCDs  Code Status:   Code Status: Full Code Family Communication: Husband at bedside. Patient status is: Inpatient because of intractable nausea and vomiting Level of care: Med-Surg    Dispo: The patient is from: home            Anticipated disposition: home tbd   Estimated body mass index is 34.12 kg/m as calculated from the following:   Height as of this encounter: 5\' 9"  (1.753 m).   Weight as of this encounter: 104.8 kg.  Subjective: She is resting in bed she vomited bile green color stuff around 5 AM this morning continues to have abdominal discomfort not able to eat anything or keep down anything however she has not had a bowel movement in few days  Objective: Vitals:   04/03/23 2005 04/03/23 2213 04/04/23 0100 04/04/23 0440  BP: 133/88  129/82 127/88 (!) 137/91  Pulse: 77 71 72 66  Resp: 18 20  16   Temp: 97.8 F (36.6 C)   97.8 F (36.6 C)  TempSrc: Oral   Oral  SpO2: 98% 95% 96% 95%  Weight:      Height:        Intake/Output Summary (Last 24 hours) at 04/04/2023 1124 Last data filed at 04/03/2023 2005 Gross per 24 hour  Intake 120 ml  Output --  Net 120 ml   Filed Weights   03/30/23 1800  Weight: 104.8 kg    Examination:  General exam: Appears calm and comfortable  Respiratory  system: Clear to auscultation. Respiratory effort normal. Cardiovascular system: S1 & S2 heard, RRR. No JVD, murmurs, rubs, gallops or clicks. No pedal edema. Gastrointestinal system: Soft tender  Central nervous system: Alert and oriented. No focal neurological deficits. Extremities: Symmetric 5 x 5 power. Skin: Left upper extremity with erythema at the site of previous IV    Data Reviewed: I have personally reviewed following labs and imaging studies  CBC: Recent Labs  Lab 03/29/23 1956 03/30/23 0735 03/30/23 2035  WBC 4.9 5.6 5.2  NEUTROABS 2.5 3.3  --   HGB 13.1 13.7 13.1  HCT 39.6 40.8 41.1  MCV 93.8 94.0 97.9  PLT 271 283 248   Basic Metabolic Panel: Recent Labs  Lab 03/29/23 1956 03/30/23 0735 03/30/23 2035  NA 140 140  --   K 3.4* 3.6  --   CL 104 105  --   CO2 26 23  --   GLUCOSE 90 92  --   BUN 17 17  --   CREATININE 0.96 0.80 0.73  CALCIUM 8.7* 8.6*  --    GFR: Estimated Creatinine Clearance: 119.2 mL/min (by C-G formula based on SCr of 0.73 mg/dL). Liver Function Tests: Recent Labs  Lab 03/29/23 1956 03/30/23 0735  AST 15 17  ALT 24 24  ALKPHOS 77 79  BILITOT 0.4 0.5  PROT 7.1 7.6  ALBUMIN 4.3 4.6   Recent Labs  Lab 03/29/23 1956 03/30/23 0735  LIPASE 17 18   No results for input(s): "AMMONIA" in the last 168 hours. Coagulation Profile: Recent Labs  Lab 03/31/23 0328  INR 1.1   Cardiac Enzymes: No results for input(s): "CKTOTAL", "CKMB", "CKMBINDEX", "TROPONINI" in the last 168 hours. BNP (last 3 results) No results for input(s): "PROBNP" in the last 8760 hours. HbA1C: No results for input(s): "HGBA1C" in the last 72 hours. CBG: No results for input(s): "GLUCAP" in the last 168 hours. Lipid Profile: No results for input(s): "CHOL", "HDL", "LDLCALC", "TRIG", "CHOLHDL", "LDLDIRECT" in the last 72 hours. Thyroid Function Tests: Recent Labs    04/01/23 1758 04/02/23 0441  TSH 41.514*  --   FREET4  --  0.83   Anemia Panel: No  results for input(s): "VITAMINB12", "FOLATE", "FERRITIN", "TIBC", "IRON", "RETICCTPCT" in the last 72 hours. Sepsis Labs: No results for input(s): "PROCALCITON", "LATICACIDVEN" in the last 168 hours.  Recent Results (from the past 240 hour(s))  Culture, blood (Routine X 2) w Reflex to ID Panel     Status: None   Collection Time: 03/30/23  8:35 PM   Specimen: BLOOD  Result Value Ref Range Status   Specimen Description   Final    BLOOD Performed at [facility], [address], [place], [place] [address]    Special Requests   Final    Blood Culture adequate volume BOTTLES DRAWN AEROBIC AND ANAEROBIC Performed at [facility], [address] [place], [place] [address]    Culture   Final    NO GROWTH 5 DAYS Performed at [facility], [address] [place], [place] [address]    Report Status 04/04/2023 FINAL  Final  Culture, blood (Routine X 2) w Reflex to ID Panel     Status: None   Collection Time: 03/30/23  8:35 PM   Specimen: BLOOD  Result Value Ref Range Status   Specimen Description   Final    BLOOD Performed at [facility], [address] [place], [place] [address]    Special Requests   Final    Blood Culture adequate volume BOTTLES DRAWN AEROBIC AND ANAEROBIC Performed at [facility], [address], [place], [place] [address]    Culture   Final    NO GROWTH 5 DAYS Performed at [facility], [address], [place], [place] [address]    Report Status 04/04/2023 FINAL  Final         Radiology Studies: No results found.      Scheduled Meds:  Chlorhexidine Gluconate Cloth  6 each Topical Daily   enoxaparin (LOVENOX) injection  40 mg Subcutaneous Q24H   levothyroxine  100 mcg Intravenous Daily   metoCLOPramide (REGLAN) injection  10 mg Intravenous Q8H   ondansetron  4 mg Oral Q4H   Or   ondansetron (ZOFRAN) IV  4 mg Intravenous Q4H   pantoprazole (PROTONIX) IV  40  mg Intravenous Q12H   polyethylene glycol  17 g Oral Daily   prochlorperazine  10 mg Intravenous Q6H   scopolamine  1 patch Transdermal Q72H   sodium chloride flush  10-40 mL Intracatheter Q12H   sodium chloride flush  3 mL Intravenous Q12H   Continuous Infusions:  lactated ringers 75 mL/hr at 04/04/23 0442     LOS: 5 days    Time spent: 77 MIN Alwyn Ren, MD  04/04/2023, 11:24 AM

## 2023-04-05 ENCOUNTER — Inpatient Hospital Stay (HOSPITAL_COMMUNITY): Payer: Commercial Managed Care - PPO

## 2023-04-05 DIAGNOSIS — R519 Headache, unspecified: Secondary | ICD-10-CM | POA: Diagnosis not present

## 2023-04-05 DIAGNOSIS — M7989 Other specified soft tissue disorders: Secondary | ICD-10-CM

## 2023-04-05 DIAGNOSIS — K5900 Constipation, unspecified: Secondary | ICD-10-CM | POA: Diagnosis not present

## 2023-04-05 DIAGNOSIS — R112 Nausea with vomiting, unspecified: Secondary | ICD-10-CM | POA: Diagnosis not present

## 2023-04-05 DIAGNOSIS — K5710 Diverticulosis of small intestine without perforation or abscess without bleeding: Secondary | ICD-10-CM | POA: Diagnosis not present

## 2023-04-05 DIAGNOSIS — R110 Nausea: Secondary | ICD-10-CM | POA: Diagnosis not present

## 2023-04-05 LAB — CBC
HCT: 38.1 % (ref 36.0–46.0)
Hemoglobin: 12.5 g/dL (ref 12.0–15.0)
MCH: 31.3 pg (ref 26.0–34.0)
MCHC: 32.8 g/dL (ref 30.0–36.0)
MCV: 95.5 fL (ref 80.0–100.0)
Platelets: 226 10*3/uL (ref 150–400)
RBC: 3.99 MIL/uL (ref 3.87–5.11)
RDW: 12.4 % (ref 11.5–15.5)
WBC: 4.5 10*3/uL (ref 4.0–10.5)
nRBC: 0 % (ref 0.0–0.2)

## 2023-04-05 LAB — COMPREHENSIVE METABOLIC PANEL
ALT: 46 U/L — ABNORMAL HIGH (ref 0–44)
AST: 91 U/L — ABNORMAL HIGH (ref 15–41)
Albumin: 3.6 g/dL (ref 3.5–5.0)
Alkaline Phosphatase: 119 U/L (ref 38–126)
Anion gap: 8 (ref 5–15)
BUN: <5 mg/dL — ABNORMAL LOW (ref 6–20)
CO2: 29 mmol/L (ref 22–32)
Calcium: 7.5 mg/dL — ABNORMAL LOW (ref 8.9–10.3)
Chloride: 102 mmol/L (ref 98–111)
Creatinine, Ser: 0.93 mg/dL (ref 0.44–1.00)
GFR, Estimated: >60 mL/min (ref >60)
Glucose, Bld: 79 mg/dL (ref 70–99)
Potassium: 3.3 mmol/L — ABNORMAL LOW (ref 3.5–5.1)
Sodium: 139 mmol/L (ref 135–145)
Total Bilirubin: 0.6 mg/dL (ref 0.3–1.2)
Total Protein: 6.7 g/dL (ref 6.5–8.1)

## 2023-04-05 MED ORDER — LORAZEPAM 0.5 MG PO TABS
0.5000 mg | ORAL_TABLET | Freq: Four times a day (QID) | ORAL | Status: DC | PRN
  Administered 2023-04-05 – 2023-04-06 (×3): 0.5 mg via ORAL
  Filled 2023-04-05 (×3): qty 1

## 2023-04-05 MED ORDER — LEVOTHYROXINE SODIUM 100 MCG/5ML IV SOLN
125.0000 ug | Freq: Every day | INTRAVENOUS | Status: AC
  Administered 2023-04-06 – 2023-04-07 (×2): 125 ug via INTRAVENOUS
  Filled 2023-04-05 (×2): qty 10

## 2023-04-05 NOTE — Progress Notes (Signed)
PROGRESS NOTE    Hannah Ballard  ZOX:[id number] DOB: [birth year] DOA: 03/30/2023 PCP: [name], NP   Brief Narrative: 42 year old female with history of [name] admitted with fever nausea and vomiting abdominal cramping after diagnosis of COVID.  Patient was initially admitted to [facility] with COVID treated with remdesivir and discharged home on p.o. Paxlovid.  She gets readmitted here with complaints of intractable nausea vomiting and abdominal cramps.  His CT of the abdomen and pelvis did not reveal any acute findings MRI of the brain shows inflammation of the paranasal sinuses which is worsened.  Assessment & Plan:   Principal Problem:   Nausea Active Problems:   Headache   Acute right eye pain   COVID-19 virus infection   Vomiting   Dehydration   #1 intractable nausea vomiting dehydration-which probably is multifactorial in the setting of COVID and Mounjaro use.  Patient was started on Mounjaro 4 weeks prior to admission, 2 weeks ago patient started with nausea and vomiting.  Patient still continues to be nauseous and vomited this morning.  She has been n.p.o. on IV fluids. 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 Upper GI series shows gastroparesis GG fistula not seen.  Continue PPI Reglan.  EGD TOMORROW. Continue IV fluids pain control and supportive care.Patient followed at [facility] for bariatric surgery.  She is not able to tolerate a diet   #2 constipation though she reports she has not been eating she has not had a bowel movement for over 5 days.  Noted GI added MiraLAX and Levsin.  #3 Headache: Reports headache is better. ?  Sinus inflammation noted on MRI will add Flonase  # 4 Acute right eye pain/recent right corneal ulcer: Reports ulcer has healed, having some diminished vision for some time and being followed by outpatient ophthalmology and currently no new issues   # 5 COVID-19 virus infection: Initially  diagnosed on July 2 at [facility] and managed with remdesivir discharged on Paxlovid but unable to tolerate-initially received remdesivir but due to lack of respiratory symptoms has been discontinued Monitoring continue supportive care   #6 Low cortisol level: Cortisol 30 minutes and 60 minutes random is normal.   #7 Abnormal thyroid labs >Tsh HIGH-but normal free T4 adjust dose to 125 mcg IV   Difficult IV access-midline has been placed 7/8   Class I Obesity:Patient's Body mass index is 34.12 kg/m. : Will benefit with PCP follow-up, weight loss  healthy lifestyle and outpatient sleep evaluation.    Superficial thrombophlebitis- supportive treatments  DVT prophylaxis: enoxaparin  SCDs  Code Status:   Code Status: Full Code Family Communication: Husband at bedside. Patient status is: Inpatient because of intractable nausea and vomiting Level of care: Med-Surg    Dispo: The patient is from: home            Anticipated disposition: home tbd   Estimated body mass index is 34.12 kg/m as calculated from the following:   Height as of this encounter: 5\' 9"  (1.753 m).   Weight as of this encounter: 104.8 kg.  Subjective: She is still nauseous and vomiting  Remains on IVF  Objective: Vitals:   04/04/23 1453 04/04/23 2029 04/04/23 2354 04/05/23 0333  BP: 132/74 (!) 142/97 110/77 (!) 152/95  Pulse: 61 64 67 83  Resp: 16 20  20   Temp: 97.8 F (36.6 C) 98.2 F (36.8 C)  97.6 F (36.4 C)  TempSrc:  Oral  Oral  SpO2: 99% 97%  99%  Weight:      Height:        Intake/Output Summary (Last 24 hours) at 04/05/2023 1229 Last data filed at 04/04/2023 2200 Gross per 24 hour  Intake 240 ml  Output --  Net 240 ml   Filed Weights   03/30/23 1800  Weight: 104.8 kg    Examination:  General exam: Appears calm and comfortable  Respiratory system: Clear to auscultation. Respiratory effort normal. Cardiovascular system: S1 & S2 heard, RRR. No JVD, murmurs, rubs, gallops or clicks. No pedal  edema. Gastrointestinal system: Soft tender  Central nervous system: Alert and oriented. No focal neurological deficits. Extremities: Symmetric 5 x 5 power. Skin: Left upper extremity with erythema at the site of previous IV IMPROVED FROM YESTERDAY    Data Reviewed: I have personally reviewed following labs and imaging studies  CBC: Recent Labs  Lab 03/29/23 1956 03/30/23 0735 03/30/23 2035  WBC 4.9 5.6 5.2  NEUTROABS 2.5 3.3  --   HGB 13.1 13.7 13.1  HCT 39.6 40.8 41.1  MCV 93.8 94.0 97.9  PLT 271 283 248   Basic Metabolic Panel: Recent Labs  Lab 03/29/23 1956 03/30/23 0735 03/30/23 2035  NA 140 140  --   K 3.4* 3.6  --   CL 104 105  --   CO2 26 23  --   GLUCOSE 90 92  --   BUN 17 17  --   CREATININE 0.96 0.80 0.73  CALCIUM 8.7* 8.6*  --    GFR: Estimated Creatinine Clearance: 119.2 mL/min (by C-G formula based on SCr of 0.73 mg/dL). Liver Function Tests: Recent Labs  Lab 03/29/23 1956 03/30/23 0735  AST 15 17  ALT 24 24  ALKPHOS 77 79  BILITOT 0.4 0.5  PROT 7.1 7.6  ALBUMIN 4.3 4.6   Recent Labs  Lab 03/29/23 1956 03/30/23 0735  LIPASE 17 18   No results for input(s): "AMMONIA" in the last 168 hours. Coagulation Profile: Recent Labs  Lab 03/31/23 0328  INR 1.1   Cardiac Enzymes: No results for input(s): "CKTOTAL", "CKMB", "CKMBINDEX", "TROPONINI" in the last 168 hours. BNP (last 3 results) No results for input(s): "PROBNP" in the last 8760 hours. HbA1C: No results for input(s): "HGBA1C" in the last 72 hours. CBG: No results for input(s): "GLUCAP" in the last 168 hours. Lipid Profile: No results for input(s): "CHOL", "HDL", "LDLCALC", "TRIG", "CHOLHDL", "LDLDIRECT" in the last 72 hours. Thyroid Function Tests: No results for input(s): "TSH", "T4TOTAL", "FREET4", "T3FREE", "THYROIDAB" in the last 72 hours.  Anemia Panel: No results for input(s): "VITAMINB12", "FOLATE", "FERRITIN", "TIBC", "IRON", "RETICCTPCT" in the last 72 hours. Sepsis  Labs: No results for input(s): "PROCALCITON", "LATICACIDVEN" in the last 168 hours.  Recent Results (from the past 240 hour(s))  Culture, blood (Routine X 2) w Reflex to ID Panel     Status: None   Collection Time: 03/30/23  8:35 PM   Specimen: BLOOD  Result Value Ref Range Status   Specimen Description   Final    BLOOD Performed at [facility], [address] [place], [place] [address]    Special Requests   Final    Blood Culture adequate volume BOTTLES DRAWN AEROBIC AND ANAEROBIC Performed at [facility], [address] [place], [place] [address]    Culture   Final    NO GROWTH 5 DAYS Performed at [facility], [address], [place], [place] [address]  Report Status 04/04/2023 FINAL  Final  Culture, blood (Routine X 2) w Reflex to ID Panel     Status: None   Collection Time: 03/30/23  8:35 PM   Specimen: BLOOD  Result Value Ref Range Status   Specimen Description   Final    BLOOD Performed at [facility], [address], [place], [place] [address]    Special Requests   Final    Blood Culture adequate volume BOTTLES DRAWN AEROBIC AND ANAEROBIC Performed at [facility], [address], [place], [place] [address]    Culture   Final    NO GROWTH 5 DAYS Performed at [facility], [address] [place], [place] [address]    Report Status 04/04/2023 FINAL  Final         Radiology Studies: VAS [place] UPPER EXTREMITY VENOUS DUPLEX  Result Date: 04/05/2023 UPPER VENOUS STUDY  Patient Name:  Hannah Ballard  Date of Exam:   06/08/7828 Medical Rec #: [id number]    Accession #:    [id number] Date of Birth: June 16, 1981     Patient Gender: F Patient Age:   57 years Exam Location:  [facility] Procedure:      VAS [place] UPPER EXTREMITY VENOUS DUPLEX Referring Phys: Kathlen Mody --------------------------------------------------------------------------------  Indications: recent IV, swelling,  covid Comparison Study: No previous study. Performing Technologist: McKayla Maag RVT, [place]  Examination Guidelines: A complete evaluation includes B-mode imaging, spectral Doppler, color Doppler, and power Doppler as needed of all accessible portions of each vessel. Bilateral testing is considered an integral part of a complete examination. Limited examinations for reoccurring indications may be performed as noted.  Right Findings: +----------+------------+---------+-----------+----------+-------+ RIGHT     CompressiblePhasicitySpontaneousPropertiesSummary +----------+------------+---------+-----------+----------+-------+ Subclavian    Full       Yes       Yes                      +----------+------------+---------+-----------+----------+-------+  Left Findings: +----------+------------+---------+-----------+----------+-------+ LEFT      CompressiblePhasicitySpontaneousPropertiesSummary +----------+------------+---------+-----------+----------+-------+ IJV           Full       Yes       Yes                      +----------+------------+---------+-----------+----------+-------+ Subclavian    Full       Yes       Yes                      +----------+------------+---------+-----------+----------+-------+ Axillary      Full       Yes       Yes                      +----------+------------+---------+-----------+----------+-------+ Brachial      Full       Yes       Yes                      +----------+------------+---------+-----------+----------+-------+ Radial        Full                                          +----------+------------+---------+-----------+----------+-------+ Ulnar         Full                                          +----------+------------+---------+-----------+----------+-------+  Cephalic      None       No        No                Acute  +----------+------------+---------+-----------+----------+-------+ Basilic       Full                                           +----------+------------+---------+-----------+----------+-------+  Summary:  Right: No evidence of thrombosis in the subclavian.  Left: No evidence of deep vein thrombosis in the upper extremity. Findings consistent with acute superficial vein thrombosis involving the left cephalic vein,proximal upper arm to proximal forearm.  *See table(s) above for measurements and observations.  Diagnosing physician: Sherald Hess MD Electronically signed by Sherald Hess MD on 04/05/2023 at 11:12:22 AM.    Final         Scheduled Meds:  Chlorhexidine Gluconate Cloth  6 each Topical Daily   levothyroxine  100 mcg Intravenous Daily   metoCLOPramide (REGLAN) injection  10 mg Intravenous Q8H   ondansetron  4 mg Oral Q4H   Or   ondansetron (ZOFRAN) IV  4 mg Intravenous Q4H   pantoprazole (PROTONIX) IV  40 mg Intravenous Q12H   polyethylene glycol  17 g Oral Daily   prochlorperazine  10 mg Intravenous Q6H   scopolamine  1 patch Transdermal Q72H   sodium chloride flush  10-40 mL Intracatheter Q12H   sodium chloride flush  3 mL Intravenous Q12H   Continuous Infusions:  lactated ringers 75 mL/hr at 04/05/23 0646     LOS: 6 days    Time spent: 35 MIN Alwyn Ren, MD  04/05/2023, 12:29 PM

## 2023-04-05 NOTE — Progress Notes (Addendum)
[facility] Progress Note  CC:  Nausea and vomiting   Subjective:  Still with constant nausea and vomiting.  Upper abdomen sore from retching.  Zofran helps for about an hour only.  Of note: H/O prior lap RYGB 2022. Adm 04/2022 with N/V/RUQ pain-extensive neg WU @ [facility] including CT enterography, [place], EGD, MRCP, HIDA. Underwent Dx laparoscopy at [facility] 05/26/2022 and rpt IntraOp EGD with repair of internal hernia, cholecystectomy. No HH, marginal ulcer or GG fistula noted.   Objective:  Vital signs in last 24 hours: Temp:  [97.6 F (36.4 C)-98.2 F (36.8 C)] 97.6 F (36.4 C) (07/11 0333) Pulse Rate:  [61-83] 83 (07/11 0333) Resp:  [16-20] 20 (07/11 0333) BP: (110-152)/(74-97) 152/95 (07/11 0333) SpO2:  [97 %-99 %] 99 % (07/11 0333) Last BM Date : 03/30/23 General:  Alert, Well-developed, in NAD Heart:  Regular rate and rhythm; no murmurs Pulm:  CTAB.  No W/R/R. Abdomen:  Soft, non-distended.  BS present.  Upper abdominal TTP. Extremities:  Without edema. Neurologic:  Alert and oriented x 4;  grossly normal neurologically. Psych:  Alert and cooperative. Normal mood and affect.  Intake/Output from previous day: 07/10 0701 - 07/11 0700 In: 240 [P.O.:240] Out: -   VAS [place] UPPER EXTREMITY VENOUS DUPLEX  Result Date: 04/05/2023 UPPER VENOUS STUDY  Patient Name:  Hannah Ballard  Date of Exam:   1/61/[birth year] Medical Rec #: [id number]    Accession #:    [id number] Date of Birth: 24-Aug-1981     Patient Gender: F Patient Age:   42 years Exam Location:  [facility] Procedure:      VAS [place] UPPER EXTREMITY VENOUS DUPLEX Referring Phys: Kathlen Mody --------------------------------------------------------------------------------  Indications: recent IV, swelling, covid Comparison Study: No previous study. Performing Technologist: McKayla Maag RVT, VT  Examination Guidelines: A complete evaluation includes B-mode imaging, spectral Doppler, color Doppler, and power Doppler as needed  of all accessible portions of each vessel. Bilateral testing is considered an integral part of a complete examination. Limited examinations for reoccurring indications may be performed as noted.  Right Findings: +----------+------------+---------+-----------+----------+-------+ RIGHT     CompressiblePhasicitySpontaneousPropertiesSummary +----------+------------+---------+-----------+----------+-------+ Subclavian    Full       Yes       Yes                      +----------+------------+---------+-----------+----------+-------+  Left Findings: +----------+------------+---------+-----------+----------+-------+ LEFT      CompressiblePhasicitySpontaneousPropertiesSummary +----------+------------+---------+-----------+----------+-------+ IJV           Full       Yes       Yes                      +----------+------------+---------+-----------+----------+-------+ Subclavian    Full       Yes       Yes                      +----------+------------+---------+-----------+----------+-------+ Axillary      Full       Yes       Yes                      +----------+------------+---------+-----------+----------+-------+ Brachial      Full       Yes       Yes                      +----------+------------+---------+-----------+----------+-------+ Radial  Full                                          +----------+------------+---------+-----------+----------+-------+ Ulnar         Full                                          +----------+------------+---------+-----------+----------+-------+ Cephalic      None       No        No                Acute  +----------+------------+---------+-----------+----------+-------+ Basilic       Full                                          +----------+------------+---------+-----------+----------+-------+  Summary:  Right: No evidence of thrombosis in the subclavian.  Left: No evidence of deep vein thrombosis in the  upper extremity. Findings consistent with acute superficial vein thrombosis involving the left cephalic vein,proximal upper arm to proximal forearm.  *See table(s) above for measurements and observations.    Preliminary     Assessment / Plan:  Nausea and vomiting:  Possible related to [facility] +/- COVID-19 +/- Migraine/ Vertigo.  TSH is very elevated as well at 41.514 so this may be contributing. Negative UGI series other than gastroparesis, no signs of GG fistula that was seen on CT. -Previous extensive workup less than a year ago at [facility] -Continue to hold [place] -Continue supportive care with IV Protonix BID, Zofran q 4 hours, scopolamine patch -Reglan 10 mg every 8 hours started 7/9, continue for now -Still using dilaudid quite frequently-try to minimize narcotics -Levsin added for AB cramping on 7/10 -Will plan for EGD 7/12 with Dr. Chales Abrahams. -Recommend follow-up [facility] bariatric clinic   Constipation Last bowel movement 6 days ago Could be due to decreased p.o. intake Added MiraLAX daily, add Levsin as needed for abdominal/stomach cramping Consider KUB Consider Motegrity for constipation/can help gastroparesis   Headache Eye pain COVID-19: Diagnosed 7 days ago History of Roux en Y 2022    LOS: 6 days   Jessica D. Zehr  04/05/2023, 9:55 AM     Attending physician's note   I have taken history, reviewed the chart and examined the patient. I performed a substantive portion of this encounter, including complete performance of at least one of the key components, in conjunction with the APP. I agree with the Advanced Practitioner's note, impression and recommendations.   Persistent N/V despite multiple antiemetics For EGD in AM   Edman Circle, MD [facility] [phone]

## 2023-04-05 NOTE — Plan of Care (Signed)

## 2023-04-05 NOTE — H&P (View-Only) (Signed)
   [facility] Progress Note  CC:  Nausea and vomiting   Subjective:  Still with constant nausea and vomiting.  Upper abdomen sore from retching.  Zofran helps for about an hour only.  Of note: H/O prior lap RYGB 2022. Adm 04/2022 with N/V/RUQ pain-extensive neg WU @ [facility] including CT enterography, US, EGD, MRCP, HIDA. Underwent Dx laparoscopy at [facility] 05/26/2022 and rpt IntraOp EGD with repair of internal hernia, cholecystectomy. No HH, marginal ulcer or GG fistula noted.   Objective:  Vital signs in last 24 hours: Temp:  [97.6 F (36.4 C)-98.2 F (36.8 C)] 97.6 F (36.4 C) (07/11 0333) Pulse Rate:  [61-83] 83 (07/11 0333) Resp:  [16-20] 20 (07/11 0333) BP: (110-152)/(74-97) 152/95 (07/11 0333) SpO2:  [97 %-99 %] 99 % (07/11 0333) Last BM Date : 03/30/23 General:  Alert, Well-developed, in NAD Heart:  Regular rate and rhythm; no murmurs Pulm:  CTAB.  No W/R/R. Abdomen:  Soft, non-distended.  BS present.  Upper abdominal TTP. Extremities:  Without edema. Neurologic:  Alert and oriented x 4;  grossly normal neurologically. Psych:  Alert and cooperative. Normal mood and affect.  Intake/Output from previous day: 07/10 0701 - 07/11 0700 In: 240 [P.O.:240] Out: -   VAS US UPPER EXTREMITY VENOUS DUPLEX  Result Date: 04/05/2023 UPPER VENOUS STUDY  Patient Name:  Hannah Ballard  Date of Exam:   04/05/2023 Medical Rec #: [id number]    Accession #:    [id number] Date of Birth: 04/28/1981     Patient Gender: F Patient Age:   42 years Exam Location:  [facility] Procedure:      VAS US UPPER EXTREMITY VENOUS DUPLEX Referring Phys: VIJAYA AKULA --------------------------------------------------------------------------------  Indications: recent IV, swelling, covid Comparison Study: No previous study. Performing Technologist: McKayla Maag RVT, VT  Examination Guidelines: A complete evaluation includes B-mode imaging, spectral Doppler, color Doppler, and power Doppler as needed  of all accessible portions of each vessel. Bilateral testing is considered an integral part of a complete examination. Limited examinations for reoccurring indications may be performed as noted.  Right Findings: +----------+------------+---------+-----------+----------+-------+ RIGHT     CompressiblePhasicitySpontaneousPropertiesSummary +----------+------------+---------+-----------+----------+-------+ Subclavian    Full       Yes       Yes                      +----------+------------+---------+-----------+----------+-------+  Left Findings: +----------+------------+---------+-----------+----------+-------+ LEFT      CompressiblePhasicitySpontaneousPropertiesSummary +----------+------------+---------+-----------+----------+-------+ IJV           Full       Yes       Yes                      +----------+------------+---------+-----------+----------+-------+ Subclavian    Full       Yes       Yes                      +----------+------------+---------+-----------+----------+-------+ Axillary      Full       Yes       Yes                      +----------+------------+---------+-----------+----------+-------+ Brachial      Full       Yes       Yes                      +----------+------------+---------+-----------+----------+-------+ Radial          Full                                          +----------+------------+---------+-----------+----------+-------+ Ulnar         Full                                          +----------+------------+---------+-----------+----------+-------+ Cephalic      None       No        No                Acute  +----------+------------+---------+-----------+----------+-------+ Basilic       Full                                          +----------+------------+---------+-----------+----------+-------+  Summary:  Right: No evidence of thrombosis in the subclavian.  Left: No evidence of deep vein thrombosis in the  upper extremity. Findings consistent with acute superficial vein thrombosis involving the left cephalic vein,proximal upper arm to proximal forearm.  *See table(s) above for measurements and observations.    Preliminary     Assessment / Plan:  Nausea and vomiting:  Possible related to Mounjaro +/- COVID-19 +/- Migraine/ Vertigo.  TSH is very elevated as well at 41.514 so this may be contributing. Negative UGI series other than gastroparesis, no signs of GG fistula that was seen on CT. -Previous extensive workup less than a year ago at [facility] -Continue to hold Mounjaro -Continue supportive care with IV Protonix BID, Zofran q 4 hours, scopolamine patch -Reglan 10 mg every 8 hours started 7/9, continue for now -Still using dilaudid quite frequently-try to minimize narcotics -Levsin added for AB cramping on 7/10 -Will plan for EGD 7/12 with Dr. Ermine Spofford. -Recommend follow-up [facility] bariatric clinic   Constipation Last bowel movement 6 days ago Could be due to decreased p.o. intake Added MiraLAX daily, add Levsin as needed for abdominal/stomach cramping Consider KUB Consider Motegrity for constipation/can help gastroparesis   Headache Eye pain COVID-19: Diagnosed 7 days ago History of Roux en Y 2022    LOS: 6 days   Jessica D. Zehr  04/05/2023, 9:55 AM     Attending physician's note   I have taken history, reviewed the chart and examined the patient. I performed a substantive portion of this encounter, including complete performance of at least one of the key components, in conjunction with the APP. I agree with the Advanced Practitioner's note, impression and recommendations.   Persistent N/V despite multiple antiemetics For EGD in AM   Raj Mileydi Milsap, MD [facility] [phone]  

## 2023-04-05 NOTE — Progress Notes (Signed)
Left upper extremity venous study completed.   Preliminary results relayed to MD and RN.  Please see CV Procedures for preliminary results.  Christene Lye, RVT  9:41 AM 04/05/23

## 2023-04-06 DIAGNOSIS — R110 Nausea: Secondary | ICD-10-CM | POA: Diagnosis not present

## 2023-04-06 NOTE — Progress Notes (Signed)
Patient denies nausea at this time.

## 2023-04-06 NOTE — Plan of Care (Signed)
Patient reporting of ongoing right eye pain, continue pain management with dilaudid, see MAR. Minimal pain relief reported. Intermittent relief from nausea reported, continue to administer scheduled and prn anti-emetics. No vomiting this shift. No BM reported. Patient NPO since MN. IV fluids infusing well thru left arm midline.   Problem: Education: Goal: Knowledge of General Education information will improve Description: Including pain rating scale, medication(s)/side effects and non-pharmacologic comfort measures Outcome: Progressing   Problem: Health Behavior/Discharge Planning: Goal: Ability to manage health-related needs will improve Outcome: Progressing   Problem: Clinical Measurements: Goal: Ability to maintain clinical measurements within normal limits will improve Outcome: Progressing Goal: Will remain free from infection Outcome: Progressing   Problem: Activity: Goal: Risk for activity intolerance will decrease Outcome: Progressing   Problem: Nutrition: Goal: Adequate nutrition will be maintained Outcome: Progressing   Problem: Elimination: Goal: Will not experience complications related to bowel motility Outcome: Progressing   Problem: Pain Managment: Goal: General experience of comfort will improve Outcome: Progressing   Problem: Safety: Goal: Ability to remain free from injury will improve Outcome: Progressing   Problem: Coping: Goal: Psychosocial and spiritual needs will be supported Outcome: Progressing

## 2023-04-06 NOTE — Progress Notes (Signed)
PROGRESS NOTE    Hannah Ballard  YNW:[id number] DOB: 1981-09-03 DOA: 03/30/2023 PCP: [name], NP   Brief Narrative: 42 year old female with history of Hannah Ballard bypass admitted with fever nausea and vomiting abdominal cramping after diagnosis of COVID.  Patient was initially admitted to [facility] with COVID treated with remdesivir and discharged home on p.o. Paxlovid.  She gets readmitted here with complaints of intractable nausea vomiting and abdominal cramps.  His CT of the abdomen and pelvis did not reveal any acute findings MRI of the brain shows inflammation of the paranasal sinuses which is worsened.  Assessment & Plan:   Principal Problem:   Nausea Active Problems:   Headache   Acute right eye pain   COVID-19 virus infection   Vomiting   Dehydration   #1 intractable nausea vomiting dehydration-which probably is multifactorial in the setting of COVID and Mounjaro use.  Patient was started on Mounjaro 4 weeks prior to admission, 2 weeks ago patient started with nausea and vomiting.  Patient still continues to be nauseous and vomited this morning.  She has been n.p.o. on IV fluids. 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 Upper GI series shows gastroparesis GG fistula not seen.  Continue PPI Reglan.  EGD 7/13  Continue IV fluids pain control and supportive care.Patient followed at [facility] for bariatric surgery.  She is not able to tolerate a diet   #2 constipation though she reports she has not been eating she has not had a bowel movement for over 5 days.  Noted GI added MiraLAX and Levsin.  #3 Headache: Reports headache is better. ?  Sinus inflammation noted on MRI will add Flonase  # 4 Acute right eye pain/recent right corneal ulcer: Reports ulcer has healed, having some diminished vision for some time and being followed by outpatient ophthalmology and currently no new issues   # 5 COVID-19 virus infection: Initially  diagnosed on July 2 at [facility] and managed with remdesivir discharged on Paxlovid but unable to tolerate-initially received remdesivir but due to lack of respiratory symptoms has been discontinued Monitoring continue supportive care   #6 Low cortisol level: Cortisol 30 minutes and 60 minutes random is normal.   #7 Abnormal thyroid labs >Tsh HIGH-but normal free T4 adjust dose to 125 mcg IV   Difficult IV access-midline has been placed 7/8   Class I Obesity:Patient's Body mass index is 34.12 kg/m. : Will benefit with PCP follow-up, weight loss  healthy lifestyle and outpatient sleep evaluation.    Superficial thrombophlebitis- supportive treatments  DVT prophylaxis: enoxaparin  SCDs  Code Status:   Code Status: Full Code Family Communication: Husband at bedside. Patient status is: Inpatient because of intractable nausea and vomiting Level of care: Med-Surg    Dispo: The patient is from: home            Anticipated disposition: home tbd   Estimated body mass index is 34.12 kg/m as calculated from the following:   Height as of this encounter: 5\' 9"  (1.753 m).   Weight as of this encounter: 104.8 kg.  Subjective: Continues to feel nauseous and vomiting Objective: Vitals:   04/05/23 1236 04/05/23 2216 04/06/23 0529 04/06/23 1206  BP: (!) 150/93 120/76 123/82 (!) 134/90  Pulse: 80 75 72 70  Resp: 16 18 16 16   Temp: 98.2 F (36.8 C) 98.1 F (36.7 C) 98.4 F (36.9 C) 98.4 F (36.9 C)  TempSrc: Oral Oral Oral Oral  SpO2: 96% 92% 96% 97%  Weight:      Height:        Intake/Output Summary (Last 24 hours) at 04/06/2023 1337 Last data filed at 04/06/2023 0924 Gross per 24 hour  Intake 5583.37 ml  Output --  Net 5583.37 ml   Filed Weights   03/30/23 1800  Weight: 104.8 kg    Examination:  General exam: Appears calm and comfortable  Respiratory system: Clear to auscultation. Respiratory effort normal. Cardiovascular system: S1 & S2 heard, RRR. No JVD, murmurs, rubs,  gallops or clicks. No pedal edema. Gastrointestinal system: Soft tender  Central nervous system: Alert and oriented. No focal neurological deficits. Extremities: Symmetric 5 x 5 power. Skin: Left upper extremity with erythema at the site of previous IV IMPROVED FROM YESTERDAY    Data Reviewed: I have personally reviewed following labs and imaging studies  CBC: Recent Labs  Lab 03/30/23 2035 04/05/23 1552  WBC 5.2 4.5  HGB 13.1 12.5  HCT 41.1 38.1  MCV 97.9 95.5  PLT 248 226   Basic Metabolic Panel: Recent Labs  Lab 03/30/23 2035 04/05/23 1552  NA  --  139  K  --  3.3*  CL  --  102  CO2  --  29  GLUCOSE  --  79  BUN  --  <5*  CREATININE 0.73 0.93  CALCIUM  --  7.5*   GFR: Estimated Creatinine Clearance: 102.5 mL/min (by C-G formula based on SCr of 0.93 mg/dL). Liver Function Tests: Recent Labs  Lab 04/05/23 1552  AST 91*  ALT 46*  ALKPHOS 119  BILITOT 0.6  PROT 6.7  ALBUMIN 3.6   No results for input(s): "LIPASE", "AMYLASE" in the last 168 hours.  No results for input(s): "AMMONIA" in the last 168 hours. Coagulation Profile: Recent Labs  Lab 03/31/23 0328  INR 1.1   Cardiac Enzymes: No results for input(s): "CKTOTAL", "CKMB", "CKMBINDEX", "TROPONINI" in the last 168 hours. BNP (last 3 results) No results for input(s): "PROBNP" in the last 8760 hours. HbA1C: No results for input(s): "HGBA1C" in the last 72 hours. CBG: No results for input(s): "GLUCAP" in the last 168 hours. Lipid Profile: No results for input(s): "CHOL", "HDL", "LDLCALC", "TRIG", "CHOLHDL", "LDLDIRECT" in the last 72 hours. Thyroid Function Tests: No results for input(s): "TSH", "T4TOTAL", "FREET4", "T3FREE", "THYROIDAB" in the last 72 hours.  Anemia Panel: No results for input(s): "VITAMINB12", "FOLATE", "FERRITIN", "TIBC", "IRON", "RETICCTPCT" in the last 72 hours. Sepsis Labs: No results for input(s): "PROCALCITON", "LATICACIDVEN" in the last 168 hours.  Recent Results (from  the past 240 hour(s))  Culture, blood (Routine X 2) w Reflex to ID Panel     Status: None   Collection Time: 03/30/23  8:35 PM   Specimen: BLOOD  Result Value Ref Range Status   Specimen Description   Final    BLOOD Performed at [facility], [address] [place], [place] [address]    Special Requests   Final    Blood Culture adequate volume BOTTLES DRAWN AEROBIC AND ANAEROBIC Performed at [facility], [address], [place], [place] [address]    Culture   Final    NO GROWTH 5 DAYS Performed at [facility], [address], [place], [place] [address]    Report Status 04/04/2023 FINAL  Final  Culture, blood (Routine X 2) w Reflex to ID Panel     Status: None   Collection Time: 03/30/23  8:35 PM   Specimen:  BLOOD  Result Value Ref Range Status   Specimen Description   Final    BLOOD Performed at [facility], [address], [place], [place] [address]    Special Requests   Final    Blood Culture adequate volume BOTTLES DRAWN AEROBIC AND ANAEROBIC Performed at [facility], [address], [place], [place] [address]    Culture   Final    NO GROWTH 5 DAYS Performed at [facility], [address], [place], [place] [address]    Report Status 04/04/2023 FINAL  Final         Radiology Studies: VAS [place] UPPER EXTREMITY VENOUS DUPLEX  Result Date: 04/05/2023 UPPER VENOUS STUDY  Patient Name:  Hannah Ballard  Date of Exam:   06/08/7828 Medical Rec #: [id number]    Accession #:    [id number] Date of Birth: 1981-08-16     Patient Gender: F Patient Age:   59 years Exam Location:  [facility] Procedure:      VAS [place] UPPER EXTREMITY VENOUS DUPLEX Referring Phys: Kathlen Mody --------------------------------------------------------------------------------  Indications: recent IV, swelling, covid Comparison Study: No previous study. Performing Technologist: McKayla Maag RVT, VT  Examination  Guidelines: A complete evaluation includes B-mode imaging, spectral Doppler, color Doppler, and power Doppler as needed of all accessible portions of each vessel. Bilateral testing is considered an integral part of a complete examination. Limited examinations for reoccurring indications may be performed as noted.  Right Findings: +----------+------------+---------+-----------+----------+-------+ RIGHT     CompressiblePhasicitySpontaneousPropertiesSummary +----------+------------+---------+-----------+----------+-------+ Subclavian    Full       Yes       Yes                      +----------+------------+---------+-----------+----------+-------+  Left Findings: +----------+------------+---------+-----------+----------+-------+ LEFT      CompressiblePhasicitySpontaneousPropertiesSummary +----------+------------+---------+-----------+----------+-------+ IJV           Full       Yes       Yes                      +----------+------------+---------+-----------+----------+-------+ Subclavian    Full       Yes       Yes                      +----------+------------+---------+-----------+----------+-------+ Axillary      Full       Yes       Yes                      +----------+------------+---------+-----------+----------+-------+ Brachial      Full       Yes       Yes                      +----------+------------+---------+-----------+----------+-------+ Radial        Full                                          +----------+------------+---------+-----------+----------+-------+ Ulnar         Full                                          +----------+------------+---------+-----------+----------+-------+ Cephalic  None       No        No                Acute  +----------+------------+---------+-----------+----------+-------+ Basilic       Full                                           +----------+------------+---------+-----------+----------+-------+  Summary:  Right: No evidence of thrombosis in the subclavian.  Left: No evidence of deep vein thrombosis in the upper extremity. Findings consistent with acute superficial vein thrombosis involving the left cephalic vein,proximal upper arm to proximal forearm.  *See table(s) above for measurements and observations.  Diagnosing physician: Sherald Hess MD Electronically signed by Sherald Hess MD on 04/05/2023 at 11:12:22 AM.    Final         Scheduled Meds:  Chlorhexidine Gluconate Cloth  6 each Topical Daily   levothyroxine  125 mcg Intravenous Daily   metoCLOPramide (REGLAN) injection  10 mg Intravenous Q8H   ondansetron  4 mg Oral Q4H   Or   ondansetron (ZOFRAN) IV  4 mg Intravenous Q4H   pantoprazole (PROTONIX) IV  40 mg Intravenous Q12H   polyethylene glycol  17 g Oral Daily   prochlorperazine  10 mg Intravenous Q6H   scopolamine  1 patch Transdermal Q72H   sodium chloride flush  10-40 mL Intracatheter Q12H   sodium chloride flush  3 mL Intravenous Q12H   Continuous Infusions:  lactated ringers 75 mL/hr at 04/06/23 0204     LOS: 7 days    Time spent: 62 MIN Alwyn Ren, MD  04/06/2023, 1:37 PM

## 2023-04-06 NOTE — Plan of Care (Signed)

## 2023-04-07 ENCOUNTER — Inpatient Hospital Stay (HOSPITAL_COMMUNITY): Payer: Commercial Managed Care - PPO | Admitting: Certified Registered Nurse Anesthetist

## 2023-04-07 ENCOUNTER — Encounter (HOSPITAL_COMMUNITY)
Admission: EM | Disposition: A | Discharge status: Home / Self Care | Payer: Self-pay | Source: Home / Self Care | Attending: Internal Medicine

## 2023-04-07 DIAGNOSIS — I10 Essential (primary) hypertension: Secondary | ICD-10-CM

## 2023-04-07 DIAGNOSIS — E039 Hypothyroidism, unspecified: Secondary | ICD-10-CM

## 2023-04-07 DIAGNOSIS — K2100 Gastro-esophageal reflux disease with esophagitis, without bleeding: Secondary | ICD-10-CM

## 2023-04-07 DIAGNOSIS — R110 Nausea: Secondary | ICD-10-CM | POA: Diagnosis not present

## 2023-04-07 DIAGNOSIS — Z87891 Personal history of nicotine dependence: Secondary | ICD-10-CM

## 2023-04-07 DIAGNOSIS — K449 Diaphragmatic hernia without obstruction or gangrene: Secondary | ICD-10-CM

## 2023-04-07 DIAGNOSIS — K2950 Unspecified chronic gastritis without bleeding: Secondary | ICD-10-CM | POA: Diagnosis not present

## 2023-04-07 DIAGNOSIS — Z980 Intestinal bypass and anastomosis status: Secondary | ICD-10-CM | POA: Diagnosis not present

## 2023-04-07 HISTORY — PX: ESOPHAGOGASTRODUODENOSCOPY (EGD) WITH PROPOFOL: SHX5813

## 2023-04-07 HISTORY — PX: BIOPSY: SHX5522

## 2023-04-07 SURGERY — ESOPHAGOGASTRODUODENOSCOPY (EGD) WITH PROPOFOL
Anesthesia: Monitor Anesthesia Care

## 2023-04-07 MED ORDER — PANTOPRAZOLE SODIUM 40 MG PO TBEC
40.0000 mg | DELAYED_RELEASE_TABLET | Freq: Two times a day (BID) | ORAL | Status: DC
  Administered 2023-04-07 – 2023-04-09 (×4): 40 mg via ORAL
  Filled 2023-04-07 (×4): qty 1

## 2023-04-07 MED ORDER — SODIUM CHLORIDE 0.9 % IV SOLN: INTRAVENOUS | Status: DC

## 2023-04-07 MED ORDER — PROPOFOL 500 MG/50ML IV EMUL
INTRAVENOUS | Status: DC | PRN
  Administered 2023-04-07: 130 ug/kg/min via INTRAVENOUS

## 2023-04-07 MED ORDER — PROPOFOL 10 MG/ML IV BOLUS
INTRAVENOUS | Status: DC | PRN
  Administered 2023-04-07 (×2): 20 mg via INTRAVENOUS
  Administered 2023-04-07: 10 mg via INTRAVENOUS

## 2023-04-07 MED ORDER — PROPOFOL 500 MG/50ML IV EMUL
INTRAVENOUS | Status: AC
  Filled 2023-04-07: qty 50

## 2023-04-07 MED ORDER — LEVOTHYROXINE SODIUM 75 MCG PO TABS
150.0000 ug | ORAL_TABLET | Freq: Every day | ORAL | Status: DC
  Administered 2023-04-08 – 2023-04-09 (×2): 150 ug via ORAL
  Filled 2023-04-07 (×2): qty 2

## 2023-04-07 MED ORDER — LACTATED RINGERS IV SOLN
INTRAVENOUS | Status: AC | PRN
  Administered 2023-04-07: 1000 mL via INTRAVENOUS

## 2023-04-07 SURGICAL SUPPLY — 15 items

## 2023-04-07 NOTE — Transfer of Care (Signed)
Immediate Anesthesia Transfer of Care Note  Patient: Hannah Ballard  Procedure(s) Performed: ESOPHAGOGASTRODUODENOSCOPY (EGD) WITH PROPOFOL BIOPSY  Patient Location:  Endo 1  Anesthesia Type:MAC  Level of Consciousness: sedated  Airway & Oxygen Therapy: Patient Spontanous Breathing and Patient connected to face mask oxygen  Post-op Assessment: Report given to RN and Post -op Vital signs reviewed and stable  Post vital signs: Reviewed and stable  Last Vitals:  Vitals Value Taken Time  BP    Temp    Pulse    Resp    SpO2      Last Pain:  Vitals:   04/07/23 0722  TempSrc: Temporal  PainSc: 0-No pain      Patients Stated Pain Goal: 0 (04/04/23 1354)  Complications: No notable events documented.

## 2023-04-07 NOTE — Anesthesia Postprocedure Evaluation (Signed)
Anesthesia Post Note  Patient: Tax inspector  Procedure(s) Performed: ESOPHAGOGASTRODUODENOSCOPY (EGD) WITH PROPOFOL BIOPSY     Patient location during evaluation: Endoscopy Anesthesia Type: MAC Level of consciousness: oriented, awake and alert and awake Pain management: pain level controlled Vital Signs Assessment: post-procedure vital signs reviewed and stable Respiratory status: spontaneous breathing, nonlabored ventilation, respiratory function stable and patient connected to nasal cannula oxygen Cardiovascular status: blood pressure returned to baseline and stable Postop Assessment: no headache, no backache and no apparent nausea or vomiting Anesthetic complications: no   No notable events documented.  Last Vitals:  Vitals:   04/07/23 0754 04/07/23 0804  BP: (!) 141/91 (!) 144/95  Pulse: 86 89  Resp: 16 18  Temp:    SpO2: 96% 94%    Last Pain:  Vitals:   04/07/23 0804  TempSrc:   PainSc: 6                  Collene Schlichter

## 2023-04-07 NOTE — Interval H&P Note (Signed)
History and Physical Interval Note:  04/07/2023 7:21 AM  Hannah Ballard  has presented today for surgery, with the diagnosis of Nausea and vomiting.  The various methods of treatment have been discussed with the patient and family. After consideration of risks, benefits and other options for treatment, the patient has consented to  Procedure(s): ESOPHAGOGASTRODUODENOSCOPY (EGD) WITH PROPOFOL (N/A) as a surgical intervention.  The patient's history has been reviewed, patient examined, no change in status, stable for surgery.  I have reviewed the patient's chart and labs.  Questions were answered to the patient's satisfaction.     [name]

## 2023-04-07 NOTE — Progress Notes (Signed)
PROGRESS NOTE    Hannah Ballard  ZOX:[id number] DOB: December 29, 1980 DOA: 03/30/2023 PCP: [name], NP   Brief Narrative: 42 year old female with history of Hannah Ballard bypass admitted with fever nausea and vomiting abdominal cramping after diagnosis of COVID.  Patient was initially admitted to [facility] with COVID treated with remdesivir and discharged home on p.o. Paxlovid.  She gets readmitted here with complaints of intractable nausea vomiting and abdominal cramps.  His CT of the abdomen and pelvis did not reveal any acute findings MRI of the brain shows inflammation of the paranasal sinuses which is worsened.  Assessment & Plan:   Principal Problem:   Nausea Active Problems:   Headache   Acute right eye pain   COVID-19 virus infection   Vomiting   Dehydration   #1 intractable nausea vomiting dehydration-which probably is multifactorial in the setting of COVID and Mounjaro use.  Patient was started on Mounjaro 4 weeks prior to admission, 2 weeks ago patient started with nausea and vomiting.  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 Upper GI series shows gastroparesis GG fistula not seen.  Continue PPI Reglan.  EGD 7/13 no erosions GE junction open.  Patient followed at [facility] for bariatric surgery.  Will try cld today Small frequent meals.  #2 constipation though she reports she has not been eating she has not had a bowel movement for over 5 days.  Noted GI added MiraLAX and Levsin.  #3 Headache: Reports headache is better. ?  Sinus inflammation noted on MRI will add Flonase  # 4 Acute right eye pain/recent right corneal ulcer: Reports ulcer has healed, having some diminished vision for some time and being followed by outpatient ophthalmology and currently no new issues   # 5 COVID-19 virus infection: Initially diagnosed on July 2 at [facility] and managed with remdesivir discharged on Paxlovid but unable to tolerate-initially received  remdesivir but due to lack of respiratory symptoms has been discontinued Monitoring continue supportive care   #6 Low cortisol level: Cortisol 30 minutes and 60 minutes random is normal.   #7 Abnormal thyroid labs >Tsh HIGH-but normal free T4 adjust dose to 125 mcg IV   Difficult IV access-midline has been placed 7/8   Class I Obesity:Patient's Body mass index is 34.12 kg/m. : Will benefit with PCP follow-up, weight loss  healthy lifestyle and outpatient sleep evaluation.    Superficial thrombophlebitis- supportive treatments improved  DVT prophylaxis: enoxaparin  SCDs  Code Status:   Code Status: Full Code Family Communication: Husband at bedside. Patient status is: Inpatient because of intractable nausea and vomiting Level of care: Med-Surg    Dispo: The patient is from: home            Anticipated disposition: home tbd   Estimated body mass index is 34.12 kg/m as calculated from the following:   Height as of this encounter: 5\' 9"  (1.753 m).   Weight as of this encounter: 104.8 kg.  Subjective:   Drowsy Trying clears Nauseous  Objective: Vitals:   04/07/23 0744 04/07/23 0754 04/07/23 0804 04/07/23 0849  BP: (!) 132/93 (!) 141/91 (!) 144/95 137/87  Pulse: 84 86 89 72  Resp: 14 16 18 18   Temp: 97.6 F (36.4 C)   98.1 F (36.7 C)  TempSrc: Temporal   Oral  SpO2: 100% 96% 94% 92%  Weight:      Height:        Intake/Output Summary (Last 24  hours) at 04/07/2023 1204 Last data filed at 04/07/2023 1610 Gross per 24 hour  Intake 2055.82 ml  Output --  Net 2055.82 ml   Filed Weights   03/30/23 1800  Weight: 104.8 kg    Examination:  General exam: Appears calm and comfortable  Respiratory system: Clear to auscultation. Respiratory effort normal. Cardiovascular system: S1 & S2 heard, RRR. No JVD, murmurs, rubs, gallops or clicks. No pedal edema. Gastrointestinal system: Soft tender  Central nervous system: Alert and oriented. No focal neurological  deficits. Extremities: Symmetric 5 x 5 power. Skin: Left upper extremity with erythema at the site of previous IV IMPROVED FROM YESTERDAY    Data Reviewed: I have personally reviewed following labs and imaging studies  CBC: Recent Labs  Lab 04/05/23 1552  WBC 4.5  HGB 12.5  HCT 38.1  MCV 95.5  PLT 226   Basic Metabolic Panel: Recent Labs  Lab 04/05/23 1552  NA 139  K 3.3*  CL 102  CO2 29  GLUCOSE 79  BUN <5*  CREATININE 0.93  CALCIUM 7.5*   GFR: Estimated Creatinine Clearance: 102.5 mL/min (by C-G formula based on SCr of 0.93 mg/dL). Liver Function Tests: Recent Labs  Lab 04/05/23 1552  AST 91*  ALT 46*  ALKPHOS 119  BILITOT 0.6  PROT 6.7  ALBUMIN 3.6   No results for input(s): "LIPASE", "AMYLASE" in the last 168 hours.  No results for input(s): "AMMONIA" in the last 168 hours. Coagulation Profile: No results for input(s): "INR", "PROTIME" in the last 168 hours.  Cardiac Enzymes: No results for input(s): "CKTOTAL", "CKMB", "CKMBINDEX", "TROPONINI" in the last 168 hours. BNP (last 3 results) No results for input(s): "PROBNP" in the last 8760 hours. HbA1C: No results for input(s): "HGBA1C" in the last 72 hours. CBG: No results for input(s): "GLUCAP" in the last 168 hours. Lipid Profile: No results for input(s): "CHOL", "HDL", "LDLCALC", "TRIG", "CHOLHDL", "LDLDIRECT" in the last 72 hours. Thyroid Function Tests: No results for input(s): "TSH", "T4TOTAL", "FREET4", "T3FREE", "THYROIDAB" in the last 72 hours.  Anemia Panel: No results for input(s): "VITAMINB12", "FOLATE", "FERRITIN", "TIBC", "IRON", "RETICCTPCT" in the last 72 hours. Sepsis Labs: No results for input(s): "PROCALCITON", "LATICACIDVEN" in the last 168 hours.  Recent Results (from the past 240 hour(s))  Culture, blood (Routine X 2) w Reflex to ID Panel     Status: None   Collection Time: 03/30/23  8:35 PM   Specimen: BLOOD  Result Value Ref Range Status   Specimen Description   Final     BLOOD Performed at [facility], [address], [place], [place] [address]    Special Requests   Final    Blood Culture adequate volume BOTTLES DRAWN AEROBIC AND ANAEROBIC Performed at [facility], [address], [place], [place] [address]    Culture   Final    NO GROWTH 5 DAYS Performed at [facility], [address] [place], [place] [address]    Report Status 04/04/2023 FINAL  Final  Culture, blood (Routine X 2) w Reflex to ID Panel     Status: None   Collection Time: 03/30/23  8:35 PM   Specimen: BLOOD  Result Value Ref Range Status   Specimen Description   Final    BLOOD Performed at [facility], [address] [place], [place] [address]    Special Requests   Final    Blood Culture adequate volume BOTTLES DRAWN AEROBIC AND ANAEROBIC Performed at [facility], [address] [place], [place] [address]    Culture   Final    NO GROWTH 5 DAYS Performed at [facility], [address], [place], [place] [address]    Report Status 04/04/2023 FINAL  Final         Radiology Studies: No results found.      Scheduled Meds:  Chlorhexidine Gluconate Cloth  6 each Topical Daily   [START ON 04/08/2023] levothyroxine  150 mcg Oral Q0600   metoCLOPramide (REGLAN) injection  10 mg Intravenous Q8H   ondansetron  4 mg Oral Q4H   Or   ondansetron (ZOFRAN) IV  4 mg Intravenous Q4H   pantoprazole  40 mg Oral BID   polyethylene glycol  17 g Oral Daily   prochlorperazine  10 mg Intravenous Q6H   scopolamine  1 patch Transdermal Q72H   sodium chloride flush  10-40 mL Intracatheter Q12H   sodium chloride flush  3 mL Intravenous Q12H   Continuous Infusions:  lactated ringers 100 mL/hr at 04/07/23 0741     LOS: 8 days    Time spent: 39 MIN Alwyn Ren, MD  04/07/2023, 12:04 PM

## 2023-04-07 NOTE — Op Note (Addendum)
[facility] Patient Name: Hannah Ballard Procedure Date: 05/05/9146 MRN: [id number] Attending MD: [name] , MD, [id number] Date of Birth: 10-15-1980 CSN: [id number] Age: 42 Admit Type: Inpatient Procedure:                Upper GI endoscopy Indications:              Persistent vomiting of unknown cause Providers:                [name], MD, Martha Clan, RN, Marja Kays, Technician Referring MD:              Medicines:                Monitored Anesthesia Care Complications:            No immediate complications. Estimated Blood Loss:     Estimated blood loss: none. Procedure:                Pre-Anesthesia Assessment:                           - Prior to the procedure, a History and Physical                            was performed, and patient medications and                            allergies were reviewed. The patient's tolerance of                            previous anesthesia was also reviewed. The risks                            and benefits of the procedure and the sedation                            options and risks were discussed with the patient.                            All questions were answered, and informed consent                            was obtained. Prior Anticoagulants: The patient has                            taken no anticoagulant or antiplatelet agents. ASA                            Grade Assessment: III - A patient with severe                            systemic disease. After reviewing the risks and  benefits, the patient was deemed in satisfactory                            condition to undergo the procedure.                           After obtaining informed consent, the endoscope was                            passed under direct vision. Throughout the                            procedure, the patient's blood pressure, pulse, and                            oxygen  saturations were monitored continuously. The                            [id number]-H190 ([id number] Olympus endoscope was introduced                            through the mouth, and advanced to the proximal                            jejunum. The upper GI endoscopy was accomplished                            without difficulty. The patient tolerated the                            procedure well. Scope In: Scope Out: Findings:      The examined esophagus was normal. Biopsies were obtained from the       proximal and distal esophagus with cold forceps for histology to r/o       eosinophilic esophagitis.      The GE junction was wide open with suggestion of a small sliding       transient hiatal hernia (best visualized on Valsalva). Z-line was       regular and was found 35 cm from the incisors.      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 The gastrojejunal anastomosis was characterized by healthy appearing       mucosa. Easily traversed. No marginal ulcers, erosions or any evidence       of GG fistula. Biopsies were taken with a cold forceps for histology.      The examined jejunum was normal. This was biopsied with a cold forceps       for histology. Minimal food was encountered in the jejunum without       obstruction. Impression:               - Normal Roux-en-Y anatomy. No erosions, marginal                            ulcers or GG fistula.                           -  Wide open GE junction with suggestion of a small                            sliding transient hiatal hernia. Moderate Sedation:      Not Applicable - Patient had care per Anesthesia. Recommendation:           - Return patient to hospital ward for ongoing care.                           - Full liquid diet. Small but frequent meals.                           - Now that she is off respiratory isolation (10                            days after COVID), ambulate                           -  Await pathology results.                           - FU bariatric clinic @ [facility].                           - Monitor weight.                           - I have reviewed upper GI series as well. Also                            look for non-GI causes of nausea. Trial of                            antianxiety medications (ativan) would be                            appropriate. Per pt, it helps a lot.                           - Will sign off for now                           - The findings and recommendations were discussed                            with the patient's family. Procedure Code(s):        --- Professional ---                           [phone], Esophagogastroduodenoscopy, flexible,                            transoral; with biopsy, single or multiple Diagnosis Code(s):        --- Professional ---  Z98.0, Intestinal bypass and anastomosis status                           R11.15, Cyclical vomiting syndrome unrelated to                            migraine CPT copyright 2022 [employer]. All rights reserved. The codes documented in this report are preliminary and upon coder review may  be revised to meet current compliance requirements. [name], MD 04/07/2023 7:56:57 AM This report has been signed electronically. Number of Addenda: 0

## 2023-04-07 NOTE — Plan of Care (Signed)
Patient slept well this shift, arousable. Persistent right eye pain oreported, scoring 7-8, continued pain management with diluadid. See MAR. NPO after MN for EGD today and administered IV meds. Intermittent nausea reported, no vomiting. Continue to treat nausea wiith anti emetics. No BM. IV fluids infusing thru right upper arm Midline, no signs of swelling or pain on site.   Problem: Education: Goal: Knowledge of General Education information will improve Description: Including pain rating scale, medication(s)/side effects and non-pharmacologic comfort measures Outcome: Progressing   Problem: Health Behavior/Discharge Planning: Goal: Ability to manage health-related needs will improve Outcome: Progressing   Problem: Clinical Measurements: Goal: Respiratory complications will improve Outcome: Progressing   Problem: Activity: Goal: Risk for activity intolerance will decrease Outcome: Progressing   Problem: Nutrition: Goal: Adequate nutrition will be maintained Outcome: Progressing   Problem: Elimination: Goal: Will not experience complications related to bowel motility Outcome: Progressing   Problem: Pain Managment: Goal: General experience of comfort will improve Outcome: Progressing   Problem: Safety: Goal: Ability to remain free from injury will improve Outcome: Progressing   Problem: Respiratory: Goal: Will maintain a patent airway Outcome: Progressing Goal: Complications related to the disease process, condition or treatment will be avoided or minimized Outcome: Progressing

## 2023-04-07 NOTE — Anesthesia Preprocedure Evaluation (Addendum)
Anesthesia Evaluation  Patient identified by MRN, date of birth, ID band Patient awake    Reviewed: Allergy & Precautions, NPO status , Patient's Chart, lab work & pertinent test results  Airway Mallampati: II  TM Distance: >3 FB Neck ROM: Full    Dental  (+) Teeth Intact, Dental Advisory Given   Pulmonary former smoker +Covid 03/27/23   Pulmonary exam normal breath sounds clear to auscultation       Cardiovascular hypertension, Normal cardiovascular exam Rhythm:Regular Rate:Normal     Neuro/Psych  Headaches PSYCHIATRIC DISORDERS Anxiety     S/p ACDF    GI/Hepatic Neg liver ROS,GERD  Medicated,,N/V S/p Gastric bypass   Endo/Other  Hypothyroidism    Renal/GU negative Renal ROS     Musculoskeletal negative musculoskeletal ROS (+)    Abdominal   Peds  Hematology negative hematology ROS (+)   Anesthesia Other Findings Day of surgery medications reviewed with the patient.  Reproductive/Obstetrics                             Anesthesia Physical Anesthesia Plan  ASA: 3  Anesthesia Plan: MAC   Post-op Pain Management:    Induction: Intravenous  PONV Risk Score and Plan: 2 and TIVA and Treatment may vary due to age or medical condition  Airway Management Planned: Natural Airway and Simple Face Mask  Additional Equipment:   Intra-op Plan:   Post-operative Plan:   Informed Consent: I have reviewed the patients History and Physical, chart, labs and discussed the procedure including the risks, benefits and alternatives for the proposed anesthesia with the patient or authorized representative who has indicated his/her understanding and acceptance.     Dental advisory given  Plan Discussed with: CRNA and Anesthesiologist  Anesthesia Plan Comments:         Anesthesia Quick Evaluation

## 2023-04-07 NOTE — Plan of Care (Signed)

## 2023-04-08 ENCOUNTER — Inpatient Hospital Stay (HOSPITAL_COMMUNITY): Payer: Commercial Managed Care - PPO

## 2023-04-08 ENCOUNTER — Other Ambulatory Visit (HOSPITAL_BASED_OUTPATIENT_CLINIC_OR_DEPARTMENT_OTHER): Payer: Self-pay

## 2023-04-08 DIAGNOSIS — R110 Nausea: Secondary | ICD-10-CM | POA: Diagnosis not present

## 2023-04-08 MED ORDER — OXYCODONE-ACETAMINOPHEN 5-325 MG PO TABS
1.0000 | ORAL_TABLET | Freq: Four times a day (QID) | ORAL | Status: DC | PRN
  Administered 2023-04-09 (×2): 2 via ORAL
  Filled 2023-04-08 (×2): qty 2

## 2023-04-08 MED ORDER — POLYETHYLENE GLYCOL 3350 17 G PO PACK
17.0000 g | PACK | Freq: Every day | ORAL | 0 refills | Status: DC
  Filled 2023-04-08: qty 14, 14d supply, fill #0

## 2023-04-08 MED ORDER — ONDANSETRON 4 MG PO TBDP
4.0000 mg | ORAL_TABLET | Freq: Three times a day (TID) | ORAL | 0 refills | Status: DC | PRN
  Filled 2023-04-08: qty 20, 7d supply, fill #0

## 2023-04-08 MED ORDER — CEPHALEXIN 500 MG PO CAPS
500.0000 mg | ORAL_CAPSULE | Freq: Four times a day (QID) | ORAL | 0 refills | Status: AC
  Filled 2023-04-08 – 2023-04-09 (×2): qty 20, 5d supply, fill #0

## 2023-04-08 MED ORDER — BISACODYL 5 MG PO TBEC
5.0000 mg | DELAYED_RELEASE_TABLET | Freq: Every day | ORAL | 0 refills | Status: DC | PRN
  Filled 2023-04-08: qty 100, 100d supply, fill #0

## 2023-04-08 MED ORDER — LEVOTHYROXINE SODIUM 125 MCG PO TABS
125.0000 ug | ORAL_TABLET | Freq: Every day | ORAL | 11 refills | Status: DC
  Filled 2023-04-08: qty 30, 30d supply, fill #0

## 2023-04-08 MED ORDER — ACETAMINOPHEN 325 MG PO TABS: 650.0000 mg | ORAL_TABLET | Freq: Four times a day (QID) | ORAL | Status: DC | PRN

## 2023-04-08 MED ORDER — SODIUM CHLORIDE 0.9 % IV SOLN
2.0000 g | Freq: Every day | INTRAVENOUS | Status: DC
  Administered 2023-04-08: 2 g via INTRAVENOUS
  Filled 2023-04-08 (×2): qty 20

## 2023-04-08 MED ORDER — SODIUM CHLORIDE 0.9% FLUSH: 10.0000 mL | INTRAVENOUS | Status: DC | PRN

## 2023-04-08 NOTE — Discharge Summary (Signed)
Physician Discharge Summary  Hannah Ballard NFA:[id number] DOB: 06/26/1981 DOA: 03/30/2023  PCP: Hannah Dies, NP  Admit date: 03/30/2023 Discharge date: 04/09/2023  Admitted From: Home Disposition: Home  Recommendations for Outpatient Follow-up:  Follow up with PCP in 1-2 weeks Please obtain BMP/CBC in one week Please follow up with [facility] bariat[facility] surgery  Home Health: None Equipment/Devices: None Discharge Condition: Stable  CODE STATUS: Full code Diet recommendation: Cardiac Brief/Interim Summary:42 year old female with history of [name] admitted with fever nausea and vomiting abdominal cramping after diagnosis of COVID. Patient was initially admitted to [facility] with COVID treated with remdesivir and discharged home on p.o. Paxlovid. She gets readmitted here with complaints of intractable nausea vomiting and abdominal cramps. His CT of the abdomen and pelvis did not reveal any acute findings MRI of the brain shows inflammation of the paranasal sinuses which is worsened.  Discharge Diagnoses:  Principal Problem:   Nausea Active Problems:   Headache   Acute right eye pain   COVID-19 virus infection   Vomiting   Dehydration  #1 intractable nausea vomiting dehydration-which probably is multifactorial in the setting of COVID and Mounjaro use.  Patient was started on Mounjaro 4 weeks prior to admission, 2 weeks ago patient started with nausea and vomiting.  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 Upper GI series showed gastroparesis ,GG fistula was not seen.  She was treated with Protonix, Reglan, Ativan. EGD 7/13 no erosions GE junction open. Advised the patient to do small frequent meals.  Follow-up with [facility].    #2 constipation continue MiraLAX   #3 Headache: Resolved  # 4 Acute right eye pain/recent right corneal ulcer: Reports ulcer has healed, having some diminished vision for some time and being  followed by outpatient ophthalmology and currently no new issues Continue home eyedrops   # 5 COVID-19 virus infection: Stable on room air .this was diagnosed on July 2 at [facility].   #6 Low cortisol level: Cortisol 30 minutes and 60 minutes random is normal.   #7 Abnormal thyroid labs >Tsh HIGH-but normal free T4 continue Synthroid at decreased dose 125 mcg.   Difficult IV access-she had very difficult IV access during the hospital stay.  Left upper extremity IV access got infiltrated and the ultrasound showed superficial thrombophlebitis.  On the day of discharge the right upper extremity IV got infiltrated.  An ultrasound of the right upper extremity on the day of discharge showed   Class I Obesity:Patient's Body mass index is 34.12 kg/m. : Will benefit with PCP follow-up, weight loss  healthy lifestyle and outpatient sleep evaluation.   Left upperextremity- superficial thrombophlebitis- supportive treatments Improved on DC  RUE iv infiltrated on dc - advised to keep the right upper extremity elevated and to ice it.  Ultrasound showed cellulitis.  Patient was started on Rocephin during hospital stay and discharged on Keflex. Estimated body mass index is 34.12 kg/m as calculated from the following:   Height as of this encounter: 5\' 9"  (1.753 m).   Weight as of this encounter: 104.8 kg.  Discharge Instructions   Allergies as of 04/08/2023       Reactions   Aspirin Swelling, Anaphylaxis   Tongue swelling Other reaction(s): Edema face/lips/tongue Facial swelling   Morphine Anaphylaxis, Swelling        Medication List     STOP taking these medications    benzonatate 200 MG capsule Commonly known as: TESSALON  TAKE these medications    acetaminophen 325 MG tablet Commonly known as: TYLENOL Take 3 tablets (975 mg total) by mouth every 6 (six) hours for 10 days. What changed: Another medication with the same name was added. Make sure you understand how and when to  take each.   acetaminophen 325 MG tablet Commonly known as: TYLENOL Take 2 tablets (650 mg total) by mouth every 6 (six) hours as needed for mild pain (or Fever >/= 101). What changed: You were already taking a medication with the same name, and this prescription was added. Make sure you understand how and when to take each.   bisacodyl 5 MG EC tablet Commonly known as: DULCOLAX Take 1 tablet (5 mg total) by mouth daily as needed for moderate constipation.   butalbital-acetaminophen-caffeine 50-325-40 MG tablet Commonly known as: FIORICET Take 1 tablet by mouth every 6 (six) hours as needed for headache.   cyclopentolate 1 % ophthalmic solution Commonly known as: CYCLODRYL,CYCLOGYL Place 1 drop into the right eye 3 (three) times daily.   fluticasone 50 MCG/ACT nasal spray Commonly known as: FLONASE Place 2 sprays into both nostrils once daily as needed for Rhinitis   gatifloxacin 0.5 % Soln Commonly known as: ZYMAXID Place 1 drop into the right eye every hour.   HYDROcodone-acetaminophen 5-325 MG tablet Commonly known as: NORCO/VICODIN Take 1 tablet by mouth every 8 (eight) hours as needed for moderate pain.   levothyroxine 150 MCG tablet Commonly known as: SYNTHROID Take 1 tablet (150 mcg total) by mouth daily.   LORazepam 0.5 MG tablet Commonly known as: ATIVAN Take 0.5 mg by mouth 3 (three) times daily as needed for anxiety.   meclizine 25 MG tablet Commonly known as: ANTIVERT Take 0.5 tablets (12.5 mg total) by mouth every 8 (eight) hours as needed. What changed: Another medication with the same name was removed. Continue taking this medication, and follow the directions you see here.   ondansetron 4 MG disintegrating tablet Commonly known as: ZOFRAN-ODT Take 1 tablet (4 mg total) by mouth every 8 (eight) hours as needed.   pantoprazole 40 MG tablet Commonly known as: PROTONIX Take 1 tablet (40 mg total) by mouth daily. What changed: when to take this    polyethylene glycol 17 g packet Commonly known as: MIRALAX / GLYCOLAX Take 17 g by mouth daily. Start taking on: April 09, 2023   prochlorperazine 25 MG suppository Commonly known as: COMPAZINE Place 1 suppository (25 mg total) rectally every 12 (twelve) hours as needed for nausea   promethazine 25 MG tablet Commonly known as: PHENERGAN Take 1 tablet (25 mg total) by mouth every 8 (eight) hours as needed for nausea or vomiting.   Zepbound 2.5 MG/0.5ML Pen Generic drug: tirzepatide Inject 2.5 mg into the skin once a week.        Allergies  Allergen Reactions   Aspirin Swelling and Anaphylaxis    Tongue swelling Other reaction(s): Edema face/lips/tongue Facial swelling    Morphine Anaphylaxis and Swelling    Consultations: gi   Procedures/Studies: VAS [place] UPPER EXTREMITY VENOUS DUPLEX  Result Date: 04/05/2023 UPPER VENOUS STUDY  Patient Name:  Teauna Trimm  Date of Exam:   9/67/8938 Medical Rec #: [id number]    Accession #:    [id number] Date of Birth: [birth year]     Patient Gender: F Patient Age:   66 years Exam Location:  [facility] Procedure:      VAS [place] UPPER EXTREMITY VENOUS DUPLEX Referring Phys: Kathlen Mody --------------------------------------------------------------------------------  Indications: recent IV, swelling, covid Comparison Study: No previous study. Performing Technologist: McKayla Maag RVT, [place]  Examination Guidelines: A complete evaluation includes B-mode imaging, spectral Doppler, color Doppler, and power Doppler as needed of all accessible portions of each vessel. Bilateral testing is considered an integral part of a complete examination. Limited examinations for reoccurring indications may be performed as noted.  Right Findings: +----------+------------+---------+-----------+----------+-------+ RIGHT     CompressiblePhasicitySpontaneousPropertiesSummary +----------+------------+---------+-----------+----------+-------+ Subclavian    Full        Yes       Yes                      +----------+------------+---------+-----------+----------+-------+  Left Findings: +----------+------------+---------+-----------+----------+-------+ LEFT      CompressiblePhasicitySpontaneousPropertiesSummary +----------+------------+---------+-----------+----------+-------+ IJV           Full       Yes       Yes                      +----------+------------+---------+-----------+----------+-------+ Subclavian    Full       Yes       Yes                      +----------+------------+---------+-----------+----------+-------+ Axillary      Full       Yes       Yes                      +----------+------------+---------+-----------+----------+-------+ Brachial      Full       Yes       Yes                      +----------+------------+---------+-----------+----------+-------+ Radial        Full                                          +----------+------------+---------+-----------+----------+-------+ Ulnar         Full                                          +----------+------------+---------+-----------+----------+-------+ Cephalic      None       No        No                Acute  +----------+------------+---------+-----------+----------+-------+ Basilic       Full                                          +----------+------------+---------+-----------+----------+-------+  Summary:  Right: No evidence of thrombosis in the subclavian.  Left: No evidence of deep vein thrombosis in the upper extremity. Findings consistent with acute superficial vein thrombosis involving the left cephalic vein,proximal upper arm to proximal forearm.  *See table(s) above for measurements and observations.  Diagnosing physician: Sherald Hess MD Electronically signed by Sherald Hess MD on 04/05/2023 at 11:12:22 AM.    Final    DG UGI W SINGLE CM (SOL OR THIN BA)  Result Date: 04/02/2023 CLINICAL DATA:  Patient with previous  gastric bypass in September of 2023. Recent coronavirus infection, discharged after inpatient treatment  few days ago. Persistent nausea and vomiting. She states that the nausea and vomiting was present for a few weeks prior to the coronavirus infection. EXAM: UPPER GI SERIES WITH KUB TECHNIQUE: After obtaining a scout radiograph a routine upper GI series was performed using thin barium FLUOROSCOPY: Radiation Exposure Index (as provided by the fluoroscopic device): 2 minutes 30 seconds. COMPARISON:  CT 03/30/2023 FINDINGS: Preliminary KUB shows a normal bowel gas pattern. There is small amount of gas in the sigmoid colon region but no abnormal small bowel pattern. Patient drank thin barium. Oropharyngeal and cervical phase are normal without evidence of aspiration, penetration or upper esophageal structure. The thoracic esophagus does not show any fixed lesion. No hiatal hernia. Patient does demonstrate esophageal dysmotility with poor primary stripping and to and fro motion of barium with some disordered tertiary contractions. Barium tablet passes slowly through the region because of the dysmotility. There is no evidence of dilatation/achalasia, which has been reported to occur in rare instances following COVID. Small postsurgical gastric pouch appears intact. No fistula demonstrated as was questioned by CT. Free flow into the efferent loop which is not dilated. No evidence of regional ulcer disease. IMPRESSION: 1. No evidence of fistula or obstruction. No evidence of ulcer disease. 2. Esophageal dysmotility with poor primary stripping and disordered tertiary contractions. Barium tablet passes slowly through the region because of the dysmotility. Electronically Signed   By: Paulina Fusi M.D.   On: 04/02/2023 10:11   MR BRAIN WO CONTRAST  Result Date: 03/30/2023 CLINICAL DATA:  Headache, increasing frequency or severity. EXAM: MRI HEAD WITHOUT CONTRAST TECHNIQUE: Multiplanar, multiecho pulse sequences of the brain  and surrounding structures were obtained without intravenous contrast. COMPARISON:  MRI of the brain March 17, 2023. FINDINGS: Incomplete study at patient's request due to headache. Brain: No acute infarction, hemorrhage, hydrocephalus, extra-axial collection or mass lesion. The brain parenchyma has normal morphology and signal characteristics. Vascular: Normal flow voids. Skull and upper cervical spine: Normal marrow signal. Sinuses/Orbits: Mild mucosal thickening throughout the paranasal sinuses, progressed since prior MRI. The orbits are maintained. Other: None. IMPRESSION: 1. Unremarkable MRI of the brain. 2. Mild inflammatory paranasal sinus disease, progressed since prior MRI. Electronically Signed   By: Baldemar Lenis M.D.   On: 03/30/2023 13:35   CT ABDOMEN PELVIS WO CONTRAST  Result Date: 03/30/2023 CLINICAL DATA:  Recent diagnosis of COVID with nausea and vomiting EXAM: CT ABDOMEN AND PELVIS WITHOUT CONTRAST TECHNIQUE: Multidetector CT imaging of the abdomen and pelvis was performed following the standard protocol without IV contrast. RADIATION DOSE REDUCTION: This exam was performed according to the departmental dose-optimization program which includes automated exposure control, adjustment of the mA and/or kV according to patient size and/or use of iterative reconstruction technique. COMPARISON:  CT abdomen and pelvis dated 06/12/2022 FINDINGS: Lower chest: No focal consolidation or pulmonary nodule in the lung bases. No pleural effusion or pneumothorax demonstrated. Partially imaged heart size is normal. Hepatobiliary: No focal hepatic lesions. No intra or extrahepatic biliary ductal dilation. Cholecystectomy. Pancreas: No focal lesions or main ductal dilation. Spleen: Normal in size without focal abnormality. Adrenals/Urinary Tract: No adrenal nodules. No suspicious renal mass, calculi or hydronephrosis. No focal bladder wall thickening. Stomach/Bowel: Postsurgical changes from  Roux-en-Y gastric bypass. Fluid and small focus of gas within the excluded segment of the stomach. No evidence of bowel wall thickening, distention, or inflammatory changes. Appendectomy. Vascular/Lymphatic: Aortic atherosclerosis. Retroaortic left renal vein. No enlarged abdominal or pelvic lymph nodes. Reproductive: No adnexal masses.  Other: No free fluid, fluid collection, or free air. Musculoskeletal: No acute or abnormal lytic or blastic osseous lesions. Unchanged L3 and L4 compression deformities. IMPRESSION: 1. 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 2.  Aortic Atherosclerosis (ICD10-I70.0). Electronically Signed   By: Agustin Cree M.D.   On: 03/30/2023 13:19   DG Chest Port 1 View  Result Date: 03/30/2023 CLINICAL DATA:  Chest pain. Nausea, vomiting and shortness of breath. EXAM: PORTABLE CHEST 1 VIEW COMPARISON:  None Available. FINDINGS: Heart size and mediastinal contours are unremarkable. There is no pleural fluid, interstitial edema, or airspace disease. The visualized osseous structures are unremarkable. IMPRESSION: No acute cardiopulmonary abnormalities. Electronically Signed   By: Signa Kell M.D.   On: 03/30/2023 08:05   CT VENOGRAM HEAD  Result Date: 03/21/2023 CLINICAL DATA:  Severe headache, right eye vision deficit. EXAM: CT VENOGRAM HEAD TECHNIQUE: Venographic phase images of the brain were obtained following the administration of intravenous contrast. Multiplanar reformats and maximum intensity projections were generated. RADIATION DOSE REDUCTION: This exam was performed according to the departmental dose-optimization program which includes automated exposure control, adjustment of the mA and/or kV according to patient size and/or use of iterative reconstruction technique. CONTRAST:  75mL OMNIPAQUE IOHEXOL 350 MG/ML SOLN COMPARISON:  03/11/2023 CT head FINDINGS: Brain: No evidence of acute  infarct, hemorrhage, mass, mass effect, or midline shift. No hydrocephalus or extra-axial fluid collection. No abnormal parenchymal enhancement. Skull: Normal. Negative for fracture or focal lesion. Sinuses/Orbits: No acute finding. Other: The mastoid air cells are well aerated. VASCULAR Arteries: No hyperdense vessel. Grossly normal arterial enhancement. Superior sagittal sinus: Normal. Straight sinus: Normal. Inferior sagittal sinus, vein of Galen and internal cerebral veins: Normal. Transverse sinuses: Normal. Sigmoid sinuses: Normal. Visualized jugular veins: Normal IMPRESSION: 1. No evidence of acute intracranial process. 2. No evidence of dural venous sinus thrombosis. Electronically Signed   By: Wiliam Ke M.D.   On: 03/21/2023 19:56   MR BRAIN W WO CONTRAST  Result Date: 03/17/2023 CLINICAL DATA:  Headache and right periorbital pain EXAM: MRI HEAD AND ORBITS WITHOUT AND WITH CONTRAST TECHNIQUE: Multiplanar, multiecho pulse sequences of the brain and surrounding structures were obtained without and with intravenous contrast. Multiplanar, multiecho pulse sequences of the orbits and surrounding structures were obtained including fat saturation techniques, before and after intravenous contrast administration. CONTRAST:  10mL GADAVIST GADOBUTROL 1 MMOL/ML IV SOLN COMPARISON:  None Available. FINDINGS: MRI HEAD FINDINGS Brain: No acute infarction, hemorrhage, hydrocephalus, extra-axial collection or mass lesion. Vascular: Normal flow voids. Skull and upper cervical spine: Normal marrow signal. Other: None. MRI ORBITS FINDINGS Orbits: No traumatic or inflammatory finding. Globes, optic nerves, orbital fat, extraocular muscles, vascular structures, and lacrimal glands are normal. Visualized sinuses: Small retention cyst in the right maxillary sinus. Soft tissues: Negative. IMPRESSION: Normal MRI of the brain and orbits. Electronically Signed   By: Deatra Robinson M.D.   On: 03/17/2023 22:22   MR ORBITS W WO  CONTRAST  Result Date: 03/17/2023 CLINICAL DATA:  Headache and right periorbital pain EXAM: MRI HEAD AND ORBITS WITHOUT AND WITH CONTRAST TECHNIQUE: Multiplanar, multiecho pulse sequences of the brain and surrounding structures were obtained without and with intravenous contrast. Multiplanar, multiecho pulse sequences of the orbits and surrounding structures were obtained including fat saturation techniques, before and after intravenous contrast administration. CONTRAST:  10mL GADAVIST GADOBUTROL 1 MMOL/ML IV SOLN COMPARISON:  None Available. FINDINGS: MRI HEAD FINDINGS Brain: No acute infarction,  hemorrhage, hydrocephalus, extra-axial collection or mass lesion. Vascular: Normal flow voids. Skull and upper cervical spine: Normal marrow signal. Other: None. MRI ORBITS FINDINGS Orbits: No traumatic or inflammatory finding. Globes, optic nerves, orbital fat, extraocular muscles, vascular structures, and lacrimal glands are normal. Visualized sinuses: Small retention cyst in the right maxillary sinus. Soft tissues: Negative. IMPRESSION: Normal MRI of the brain and orbits. Electronically Signed   By: Deatra Robinson M.D.   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 This was found to be concordant by Dr. Gerome Sam. Pathology results were discussed with the patient by telephone. The patient reported doing well after the biopsy with tenderness and bruising at the site. Post biopsy instructions and care were reviewed and questions were answered. The patient was encouraged to call [facility] for any additional concerns. The  patient was instructed to return for RIGHT diagnostic mammography in 6 months and informed a reminder notice would be sent regarding this appointment. Pathology results reported by Rene Kocher, RN on 03/13/2023. Electronically Signed   By: Gerome Sam III M.D.   On: 03/13/2023 12:22   Result Date: 03/13/2023 CLINICAL DATA:  Biopsy possible right breast distortion. EXAM: RIGHT BREAST STEREOTACTIC CORE NEEDLE BIOPSY COMPARISON:  Previous exam(s). FINDINGS: The patient and I discussed the procedure of stereotactic-guided biopsy including benefits and alternatives. We discussed the high likelihood of a successful procedure. We discussed the risks of the procedure including infection, bleeding, tissue injury, clip migration, and inadequate sampling. Informed written consent was given. The usual time out protocol was performed immediately prior to the procedure. Using sterile technique and 1% Lidocaine as local anesthetic, under stereotactic guidance, a 9 gauge vacuum assisted device was used to perform core needle biopsy of possible right breast distortion using a superior approach. Lesion quadrant: Upper outer right breast At the conclusion of the procedure, a tissue marker clip was deployed into the biopsy cavity. Follow-up 2-view mammogram was performed and dictated separately. IMPRESSION: Stereotactic-guided biopsy of possible right breast distortion. No apparent complications. Electronically Signed: By: Gerome Sam III M.D. On: 03/12/2023 12:09  MM CLIP PLACEMENT RIGHT  Result Date: 03/12/2023 CLINICAL DATA:  Evaluate biopsy marker EXAM: 3D DIAGNOSTIC RIGHT MAMMOGRAM POST STEREOTACTIC BIOPSY COMPARISON:  Previous exam(s). FINDINGS: 3D Mammographic images were obtained following stereotactic guided biopsy of possible right breast distortion. The biopsy marking clip is in expected position at the site of biopsy. IMPRESSION: Appropriate positioning of the coil shaped biopsy marking clip at the site of  biopsy in the location of the biopsied right distortion. Final Assessment: Post Procedure Mammograms for Marker Placement Electronically Signed   By: Gerome Sam III M.D.   On: 03/12/2023 12:14  (Echo, Carotid, EGD, Colonoscopy, ERCP)    Subjective:  C/o pain RUE IV infiltrated  Discharge Exam: Vitals:   04/07/23 2020 04/08/23 0400  BP: 138/85 (!) 145/82  Pulse: 79 89  Resp: 18 16  Temp: 98.3 F (36.8 C) 98.6 F (37 C)  SpO2: 95% 97%   Vitals:   04/07/23 0849 04/07/23 1211 04/07/23 2020 04/08/23 0400  BP: 137/87 128/89 138/85 (!) 145/82  Pulse: 72 65 79 89  Resp: 18  18  16  Temp: 98.1 F (36.7 C) 98.4 F (36.9 C) 98.3 F (36.8 C) 98.6 F (37 C)  TempSrc: Oral Oral Oral Oral  SpO2: 92% 98% 95% 97%  Weight:      Height:        General: Pt is alert, awake, not in acute distress Cardiovascular: RRR, S1/S2 +, no rubs, no gallops Respiratory: CTA bilaterally, no wheezing, no rhonchi Abdominal: Soft, NT, ND, bowel sounds + Extremities: RUE edema erythema     The results of significant diagnostics from this hospitalization (including imaging, microbiology, ancillary and laboratory) are listed below for reference.     Microbiology: Recent Results (from the past 240 hour(s))  Culture, blood (Routine X 2) w Reflex to ID Panel     Status: None   Collection Time: 03/30/23  8:35 PM   Specimen: BLOOD  Result Value Ref Range Status   Specimen Description   Final    BLOOD Performed at [facility], [address] [place], [place] [address]    Special Requests   Final    Blood Culture adequate volume BOTTLES DRAWN AEROBIC AND ANAEROBIC Performed at [facility], [address], [place], [place] [address]    Culture   Final    NO GROWTH 5 DAYS Performed at [facility], [address], [place], [place] [address]    Report Status 04/04/2023 FINAL  Final  Culture, blood (Routine X 2) w Reflex to ID Panel     Status: None    Collection Time: 03/30/23  8:35 PM   Specimen: BLOOD  Result Value Ref Range Status   Specimen Description   Final    BLOOD Performed at [facility], [address] [place], [place] [address]    Special Requests   Final    Blood Culture adequate volume BOTTLES DRAWN AEROBIC AND ANAEROBIC Performed at [facility], [address], [place], [place] [address]    Culture   Final    NO GROWTH 5 DAYS Performed at [facility], [address], [place], [place] [address]    Report Status 04/04/2023 FINAL  Final     Labs: BNP (last 3 results) No results for input(s): "BNP" in the last 8760 hours. Basic Metabolic Panel: Recent Labs  Lab 04/05/23 1552  NA 139  K 3.3*  CL 102  CO2 29  GLUCOSE 79  BUN <5*  CREATININE 0.93  CALCIUM 7.5*   Liver Function Tests: Recent Labs  Lab 04/05/23 1552  AST 91*  ALT 46*  ALKPHOS 119  BILITOT 0.6  PROT 6.7  ALBUMIN 3.6   No results for input(s): "LIPASE", "AMYLASE" in the last 168 hours. No results for input(s): "AMMONIA" in the last 168 hours. CBC: Recent Labs  Lab 04/05/23 1552  WBC 4.5  HGB 12.5  HCT 38.1  MCV 95.5  PLT 226   Cardiac Enzymes: No results for input(s): "CKTOTAL", "CKMB", "CKMBINDEX", "TROPONINI" in the last 168 hours. BNP: Invalid input(s): "POCBNP" CBG: No results for input(s): "GLUCAP" in the last 168 hours. D-Dimer No results for input(s): "DDIMER" in the last 72 hours. Hgb A1c No results for input(s): "HGBA1C" in the last 72 hours. Lipid Profile No results for input(s): "CHOL", "HDL", "LDLCALC", "TRIG", "CHOLHDL", "LDLDIRECT" in the last 72 hours. Thyroid function studies No results for input(s): "TSH", "T4TOTAL", "T3FREE", "THYROIDAB" in the last 72 hours.  Invalid input(s): "FREET3" Anemia work up No results for input(s): "VITAMINB12", "FOLATE", "  FERRITIN", "TIBC", "IRON", "RETICCTPCT" in the last 72 hours. Urinalysis No results found for:  "COLORURINE", "APPEARANCEUR", "LABSPEC", "PHURINE", "GLUCOSEU", "HGBUR", "BILIRUBINUR", "KETONESUR", "PROTEINUR", "UROBILINOGEN", "NITRITE", "LEUKOCYTESUR" Sepsis Labs Recent Labs  Lab 04/05/23 1552  WBC 4.5   Microbiology Recent Results (from the past 240 hour(s))  Culture, blood (Routine X 2) w Reflex to ID Panel     Status: None   Collection Time: 03/30/23  8:35 PM   Specimen: BLOOD  Result Value Ref Range Status   Specimen Description   Final    BLOOD Performed at [facility], [address], [place], [place] [address]    Special Requests   Final    Blood Culture adequate volume BOTTLES DRAWN AEROBIC AND ANAEROBIC Performed at [facility], [address] [place], [place] [address]    Culture   Final    NO GROWTH 5 DAYS Performed at [facility], [address] [place], [place] [address]    Report Status 04/04/2023 FINAL  Final  Culture, blood (Routine X 2) w Reflex to ID Panel     Status: None   Collection Time: 03/30/23  8:35 PM   Specimen: BLOOD  Result Value Ref Range Status   Specimen Description   Final    BLOOD Performed at [facility], [address] [place], [place] [address]    Special Requests   Final    Blood Culture adequate volume BOTTLES DRAWN AEROBIC AND ANAEROBIC Performed at [facility], [address], [place], [place] [address]    Culture   Final    NO GROWTH 5 DAYS Performed at [facility], [address] [place], [place] [address]    Report Status 04/04/2023 FINAL  Final     Time coordinating discharge: 34 minutes  SIGNED:  Alwyn Ren, MD  [facility] 04/08/2023, 12:00 PM

## 2023-04-08 NOTE — Progress Notes (Signed)
PROGRESS NOTE    Hannah Ballard  ZOX:[id number] DOB: 09/12/1981 DOA: 03/30/2023 PCP: [name], NP   Brief Narrative: 42 year old female with history of [name] admitted with fever nausea and vomiting abdominal cramping after diagnosis of COVID.  Patient was initially admitted to [facility] with COVID treated with remdesivir and discharged home on p.o. Paxlovid.  She gets readmitted here with complaints of intractable nausea vomiting and abdominal cramps.  His CT of the abdomen and pelvis did not reveal any acute findings MRI of the brain shows inflammation of the paranasal sinuses which is worsened.  Assessment & Plan:   Principal Problem:   Nausea Active Problems:   Headache   Acute right eye pain   COVID-19 virus infection   Vomiting   Dehydration   #1 intractable nausea vomiting dehydration-which probably is multifactorial in the setting of COVID and Mounjaro use.  Patient was started on Mounjaro 4 weeks prior to admission, 2 weeks ago patient started with nausea and vomiting.  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 Upper GI series shows gastroparesis GG fistula not seen.  Continue PPI Reglan.  EGD 7/13 no erosions GE junction open.  Patient followed at [facility] for bariatric surgery.  Small frequent meals.  #2 constipation though she reports she has not been eating she has not had a bowel movement for over 5 days.  Noted GI added MiraLAX and Levsin.  #3 Headache: Reports headache is better. ?  Sinus inflammation noted on MRI will add Flonase  # 4 Acute right eye pain/recent right corneal ulcer: Reports ulcer has healed, having some diminished vision for some time and being followed by outpatient ophthalmology and currently no new issues   # 5 COVID-19 virus infection: Initially diagnosed on July 2 at [facility] and managed with remdesivir discharged on Paxlovid but unable to tolerate-initially received remdesivir but due  to lack of respiratory symptoms has been discontinued Monitoring continue supportive care   #6 Low cortisol level: Cortisol 30 minutes and 60 minutes random is normal.   #7 Abnormal thyroid labs >Tsh HIGH-but normal free T4 adjust dose to 125 mcg    Difficult IV access-midline has been placed 7/8   Class I Obesity:Patient's Body mass index is 34.12 kg/m. : Will benefit with PCP follow-up, weight loss  healthy lifestyle and outpatient sleep evaluation.    left upper ext Superficial thrombophlebitis- supportive treatments improved  RUE cellulitis at iv site-start rocephin  She request to stay another day to get iv antibiotic. DVT prophylaxis: enoxaparin  SCDs  Code Status:   Code Status: Full Code Family Communication: Husband at bedside. Patient status is: Inpatient because of intractable nausea and vomiting Level of care: Med-Surg    Dispo: The patient is from: home            Anticipated disposition: home tbd   Estimated body mass index is 34.12 kg/m as calculated from the following:   Height as of this encounter: 5\' 9"  (1.753 m).   Weight as of this encounter: 104.8 kg.  Subjective:c/o pain at the site rue iv  Objective: Vitals:   04/07/23 1211 04/07/23 2020 04/08/23 0400 04/08/23 1205  BP: 128/89 138/85 (!) 145/82 (!) 127/91  Pulse: 65 79 89 95  Resp:  18 16 16   Temp: 98.4 F (36.9 C) 98.3 F (36.8 C) 98.6 F (37 C) 98.3 F (36.8 C)  TempSrc: Oral Oral Oral Oral  SpO2: 98% 95%  97% 97%  Weight:      Height:        Intake/Output Summary (Last 24 hours) at 04/08/2023 1458 Last data filed at 04/08/2023 0205 Gross per 24 hour  Intake 885.92 ml  Output --  Net 885.92 ml   Filed Weights   03/30/23 1800  Weight: 104.8 kg    Examination:  General exam: Appears calm and comfortable  Respiratory system: Clear to auscultation. Respiratory effort normal. Cardiovascular system: S1 & S2 heard, RRR. No JVD, murmurs, rubs, gallops or clicks. No pedal  edema. Gastrointestinal system: Soft tender  Central nervous system: Alert and oriented. No focal neurological deficits. Extremities: Symmetric 5 x 5 power. Skin: Left upper extremity with erythema at the site of previous IV IMPROVED FROM YESTERDAY  RUE erythema edema   Data Reviewed: I have personally reviewed following labs and imaging studies  CBC: Recent Labs  Lab 04/05/23 1552  WBC 4.5  HGB 12.5  HCT 38.1  MCV 95.5  PLT 226   Basic Metabolic Panel: Recent Labs  Lab 04/05/23 1552  NA 139  K 3.3*  CL 102  CO2 29  GLUCOSE 79  BUN <5*  CREATININE 0.93  CALCIUM 7.5*   GFR: Estimated Creatinine Clearance: 102.5 mL/min (by C-G formula based on SCr of 0.93 mg/dL). Liver Function Tests: Recent Labs  Lab 04/05/23 1552  AST 91*  ALT 46*  ALKPHOS 119  BILITOT 0.6  PROT 6.7  ALBUMIN 3.6   No results for input(s): "LIPASE", "AMYLASE" in the last 168 hours.  No results for input(s): "AMMONIA" in the last 168 hours. Coagulation Profile: No results for input(s): "INR", "PROTIME" in the last 168 hours.  Cardiac Enzymes: No results for input(s): "CKTOTAL", "CKMB", "CKMBINDEX", "TROPONINI" in the last 168 hours. BNP (last 3 results) No results for input(s): "PROBNP" in the last 8760 hours. HbA1C: No results for input(s): "HGBA1C" in the last 72 hours. CBG: No results for input(s): "GLUCAP" in the last 168 hours. Lipid Profile: No results for input(s): "CHOL", "HDL", "LDLCALC", "TRIG", "CHOLHDL", "LDLDIRECT" in the last 72 hours. Thyroid Function Tests: No results for input(s): "TSH", "T4TOTAL", "FREET4", "T3FREE", "THYROIDAB" in the last 72 hours.  Anemia Panel: No results for input(s): "VITAMINB12", "FOLATE", "FERRITIN", "TIBC", "IRON", "RETICCTPCT" in the last 72 hours. Sepsis Labs: No results for input(s): "PROCALCITON", "LATICACIDVEN" in the last 168 hours.  Recent Results (from the past 240 hour(s))  Culture, blood (Routine X 2) w Reflex to ID Panel      Status: None   Collection Time: 03/30/23  8:35 PM   Specimen: BLOOD  Result Value Ref Range Status   Specimen Description   Final    BLOOD Performed at [facility], [address], [place], [place] [address]    Special Requests   Final    Blood Culture adequate volume BOTTLES DRAWN AEROBIC AND ANAEROBIC Performed at [facility], [address] [place], [place] [address]    Culture   Final    NO GROWTH 5 DAYS Performed at [facility], [address], [place], [place] [address]    Report Status 04/04/2023 FINAL  Final  Culture, blood (Routine X 2) w Reflex to ID Panel     Status: None   Collection Time: 03/30/23  8:35 PM   Specimen: BLOOD  Result Value Ref Range Status   Specimen Description   Final    BLOOD Performed at [facility], [address] [place], [place]  [address]    Special Requests   Final    Blood Culture adequate volume BOTTLES DRAWN AEROBIC AND ANAEROBIC Performed at [facility], [address] [place], [place] [address]    Culture   Final    NO GROWTH 5 DAYS Performed at [facility], [address], [place], [place] [address]    Report Status 04/04/2023 FINAL  Final         Radiology Studies: [place] RT UPPER EXTREM LTD SOFT TISSUE NON VASCULAR  Result Date: 04/08/2023 CLINICAL DATA:  Pain and swelling EXAM: ULTRASOUND RIGHT UPPER EXTREMITY LIMITED TECHNIQUE: Ultrasound examination of the upper extremity soft tissues was performed in the area of clinical concern. COMPARISON:  None Available. FINDINGS: Targeted ultrasound was performed of the soft tissues of the right upper extremity at site of patient's clinical concern. Prominent echogenic appearance of the subcutaneous fat. There is edema throughout the subcutaneous soft tissues and mild hyperemia. No organized fluid collection. IMPRESSION: Edema and mild hyperemia of the subcutaneous soft tissues of the right upper  extremity, suggestive of cellulitis. No organized fluid collection. Electronically Signed   By: Duanne Guess D.O.   On: 04/08/2023 12:46        Scheduled Meds:  Chlorhexidine Gluconate Cloth  6 each Topical Daily   levothyroxine  150 mcg Oral Q0600   metoCLOPramide (REGLAN) injection  10 mg Intravenous Q8H   ondansetron  4 mg Oral Q4H   Or   ondansetron (ZOFRAN) IV  4 mg Intravenous Q4H   pantoprazole  40 mg Oral BID   polyethylene glycol  17 g Oral Daily   prochlorperazine  10 mg Intravenous Q6H   scopolamine  1 patch Transdermal Q72H   sodium chloride flush  10-40 mL Intracatheter Q12H   sodium chloride flush  3 mL Intravenous Q12H   Continuous Infusions:  cefTRIAXone (ROCEPHIN)  IV       LOS: 9 days    Time spent: 70 MIN Alwyn Ren, MD  04/08/2023, 2:58 PM

## 2023-04-08 NOTE — Plan of Care (Signed)

## 2023-04-08 NOTE — Plan of Care (Signed)
°  Problem: Education: °Goal: Knowledge of General Education information will improve °Description: Including pain rating scale, medication(s)/side effects and non-pharmacologic comfort measures °Outcome: Progressing °  °Problem: Health Behavior/Discharge Planning: °Goal: Ability to manage health-related needs will improve °Outcome: Progressing °  °Problem: Clinical Measurements: °Goal: Will remain free from infection °Outcome: Progressing °  °Problem: Activity: °Goal: Risk for activity intolerance will decrease °Outcome: Progressing °  °Problem: Nutrition: °Goal: Adequate nutrition will be maintained °Outcome: Progressing °  °Problem: Coping: °Goal: Level of anxiety will decrease °Outcome: Progressing °  °Problem: Elimination: °Goal: Will not experience complications related to bowel motility °Outcome: Progressing °  °Problem: Pain Managment: °Goal: General experience of comfort will improve °Outcome: Progressing °  °Problem: Safety: °Goal: Ability to remain free from injury will improve °Outcome: Progressing °  °Problem: Skin Integrity: °Goal: Risk for impaired skin integrity will decrease °Outcome: Progressing °  °

## 2023-04-09 ENCOUNTER — Other Ambulatory Visit (HOSPITAL_COMMUNITY): Payer: Self-pay

## 2023-04-09 ENCOUNTER — Encounter (HOSPITAL_COMMUNITY): Payer: Self-pay | Admitting: Gastroenterology

## 2023-04-09 ENCOUNTER — Other Ambulatory Visit (HOSPITAL_BASED_OUTPATIENT_CLINIC_OR_DEPARTMENT_OTHER): Payer: Self-pay

## 2023-04-09 DIAGNOSIS — R110 Nausea: Secondary | ICD-10-CM | POA: Diagnosis not present

## 2023-04-09 LAB — BASIC METABOLIC PANEL
Anion gap: 6 (ref 5–15)
BUN: 6 mg/dL (ref 6–20)
CO2: 27 mmol/L (ref 22–32)
Calcium: 7.4 mg/dL — ABNORMAL LOW (ref 8.9–10.3)
Chloride: 105 mmol/L (ref 98–111)
Creatinine, Ser: 0.8 mg/dL (ref 0.44–1.00)
GFR, Estimated: >60 mL/min (ref >60)
Glucose, Bld: 78 mg/dL (ref 70–99)
Potassium: 3.3 mmol/L — ABNORMAL LOW (ref 3.5–5.1)
Sodium: 138 mmol/L (ref 135–145)

## 2023-04-09 LAB — CBC
HCT: 36.9 % (ref 36.0–46.0)
Hemoglobin: 12 g/dL (ref 12.0–15.0)
MCH: 30.8 pg (ref 26.0–34.0)
MCHC: 32.5 g/dL (ref 30.0–36.0)
MCV: 94.6 fL (ref 80.0–100.0)
Platelets: 249 10*3/uL (ref 150–400)
RBC: 3.9 MIL/uL (ref 3.87–5.11)
RDW: 12.9 % (ref 11.5–15.5)
WBC: 7.7 10*3/uL (ref 4.0–10.5)
nRBC: 0 % (ref 0.0–0.2)

## 2023-04-09 MED ORDER — ONDANSETRON 4 MG PO TBDP
4.0000 mg | ORAL_TABLET | Freq: Three times a day (TID) | ORAL | 0 refills | Status: DC | PRN
  Filled 2023-04-09: qty 30, 10d supply, fill #0

## 2023-04-09 MED ORDER — HYDROCODONE-ACETAMINOPHEN 5-325 MG PO TABS
1.0000 | ORAL_TABLET | Freq: Four times a day (QID) | ORAL | 0 refills | Status: DC | PRN
  Filled 2023-04-09: qty 30, 5d supply, fill #0

## 2023-04-09 MED ORDER — OXYCODONE-ACETAMINOPHEN 5-325 MG PO TABS
1.0000 | ORAL_TABLET | ORAL | Status: DC | PRN
  Administered 2023-04-09: 2 via ORAL
  Filled 2023-04-09: qty 2

## 2023-04-09 MED ORDER — HYDROCODONE-ACETAMINOPHEN 5-325 MG PO TABS
1.0000 | ORAL_TABLET | Freq: Four times a day (QID) | ORAL | 0 refills | Status: DC | PRN
  Filled 2023-04-09: qty 30, 8d supply, fill #0

## 2023-04-09 MED ORDER — METOCLOPRAMIDE HCL 10 MG PO TABS
10.0000 mg | ORAL_TABLET | Freq: Four times a day (QID) | ORAL | 1 refills | Status: DC | PRN
  Filled 2023-04-09: qty 30, 8d supply, fill #0

## 2023-04-09 NOTE — Plan of Care (Signed)
  Problem: Education: Goal: Knowledge of General Education information will improve Description Including pain rating scale, medication(s)/side effects and non-pharmacologic comfort measures Outcome: Progressing   

## 2023-04-10 ENCOUNTER — Encounter: Payer: Self-pay | Admitting: Nurse Practitioner

## 2023-04-10 ENCOUNTER — Other Ambulatory Visit: Payer: Self-pay

## 2023-04-10 ENCOUNTER — Emergency Department
Admission: EM | Admit: 2023-04-10 | Discharge: 2023-04-10 | Disposition: A | Discharge status: Home / Self Care | Payer: Commercial Managed Care - PPO | Attending: Emergency Medicine | Admitting: Emergency Medicine

## 2023-04-10 ENCOUNTER — Telehealth: Payer: Self-pay

## 2023-04-10 ENCOUNTER — Ambulatory Visit (INDEPENDENT_AMBULATORY_CARE_PROVIDER_SITE_OTHER): Payer: Commercial Managed Care - PPO | Admitting: Nurse Practitioner

## 2023-04-10 VITALS — BP 134/84 | HR 100 | Temp 99.6°F | Ht 69.0 in | Wt 225.8 lb

## 2023-04-10 DIAGNOSIS — K316 Fistula of stomach and duodenum: Secondary | ICD-10-CM

## 2023-04-10 DIAGNOSIS — R7989 Other specified abnormal findings of blood chemistry: Secondary | ICD-10-CM

## 2023-04-10 DIAGNOSIS — R110 Nausea: Secondary | ICD-10-CM | POA: Diagnosis not present

## 2023-04-10 DIAGNOSIS — E876 Hypokalemia: Secondary | ICD-10-CM | POA: Diagnosis not present

## 2023-04-10 DIAGNOSIS — R112 Nausea with vomiting, unspecified: Secondary | ICD-10-CM | POA: Insufficient documentation

## 2023-04-10 LAB — CBC WITH DIFFERENTIAL/PLATELET
Abs Immature Granulocytes: 0.02 10*3/uL (ref 0.00–0.07)
Basophils Absolute: 0.1 10*3/uL (ref 0.0–0.1)
Basophils Relative: 1 %
Eosinophils Absolute: 0.2 10*3/uL (ref 0.0–0.5)
Eosinophils Relative: 3 %
HCT: 36.8 % (ref 36.0–46.0)
Hemoglobin: 12.2 g/dL (ref 12.0–15.0)
Immature Granulocytes: 0 %
Lymphocytes Relative: 37 %
Lymphs Abs: 2.7 10*3/uL (ref 0.7–4.0)
MCH: 31.2 pg (ref 26.0–34.0)
MCHC: 33.2 g/dL (ref 30.0–36.0)
MCV: 94.1 fL (ref 80.0–100.0)
Monocytes Absolute: 0.5 10*3/uL (ref 0.1–1.0)
Monocytes Relative: 7 %
Neutro Abs: 3.7 10*3/uL (ref 1.7–7.7)
Neutrophils Relative %: 52 %
Platelets: 293 10*3/uL (ref 150–400)
RBC: 3.91 MIL/uL (ref 3.87–5.11)
RDW: 12.9 % (ref 11.5–15.5)
WBC: 7.2 10*3/uL (ref 4.0–10.5)
nRBC: 0 % (ref 0.0–0.2)

## 2023-04-10 LAB — COMPREHENSIVE METABOLIC PANEL
ALT: 24 U/L (ref 0–44)
AST: 16 U/L (ref 15–41)
Albumin: 3.7 g/dL (ref 3.5–5.0)
Alkaline Phosphatase: 95 U/L (ref 38–126)
Anion gap: 8 (ref 5–15)
BUN: 10 mg/dL (ref 6–20)
CO2: 23 mmol/L (ref 22–32)
Calcium: 7.3 mg/dL — ABNORMAL LOW (ref 8.9–10.3)
Chloride: 107 mmol/L (ref 98–111)
Creatinine, Ser: 0.81 mg/dL (ref 0.44–1.00)
GFR, Estimated: >60 mL/min (ref >60)
Glucose, Bld: 88 mg/dL (ref 70–99)
Potassium: 3.7 mmol/L (ref 3.5–5.1)
Sodium: 138 mmol/L (ref 135–145)
Total Bilirubin: 0.6 mg/dL (ref 0.3–1.2)
Total Protein: 6.9 g/dL (ref 6.5–8.1)

## 2023-04-10 LAB — SURGICAL PATHOLOGY

## 2023-04-10 LAB — LIPASE, BLOOD: Lipase: 29 U/L (ref 11–51)

## 2023-04-10 MED ORDER — HYDROMORPHONE HCL 1 MG/ML IJ SOLN
0.5000 mg | Freq: Once | INTRAMUSCULAR | Status: AC
  Administered 2023-04-10: 0.5 mg via INTRAVENOUS
  Filled 2023-04-10: qty 0.5

## 2023-04-10 MED ORDER — SODIUM CHLORIDE 0.9 % IV BOLUS
1000.0000 mL | Freq: Once | INTRAVENOUS | Status: AC
  Administered 2023-04-10: 1000 mL via INTRAVENOUS

## 2023-04-10 MED ORDER — KETOROLAC TROMETHAMINE 15 MG/ML IJ SOLN
15.0000 mg | Freq: Once | INTRAMUSCULAR | Status: AC
  Administered 2023-04-10: 15 mg via INTRAVENOUS
  Filled 2023-04-10: qty 1

## 2023-04-10 MED ORDER — ONDANSETRON HCL 4 MG/2ML IJ SOLN
4.0000 mg | Freq: Once | INTRAMUSCULAR | Status: AC
  Administered 2023-04-10: 4 mg via INTRAVENOUS
  Filled 2023-04-10: qty 2

## 2023-04-10 MED ORDER — METOCLOPRAMIDE HCL 5 MG/ML IJ SOLN
10.0000 mg | Freq: Once | INTRAMUSCULAR | Status: AC
  Administered 2023-04-10: 10 mg via INTRAVENOUS
  Filled 2023-04-10: qty 2

## 2023-04-10 MED ORDER — SODIUM CHLORIDE 0.9 % IV SOLN
2.0000 g | Freq: Once | INTRAVENOUS | Status: AC
  Administered 2023-04-10: 2 g via INTRAVENOUS
  Filled 2023-04-10: qty 20

## 2023-04-10 NOTE — Patient Instructions (Addendum)
Will check BMP. Continue to take nausea medication. You are schedule to see Dr. Artist Pais at [facility] on Thursday 7/18/24at 9:20  Nausea, Adult Nausea is the feeling of having an upset stomach or that you are about to vomit. Nausea on its own is not usually a serious concern, but it may be an early sign of a more serious medical problem. As nausea gets worse, it can lead to vomiting. If vomiting develops, or if you are not able to drink enough fluids, you are at risk of becoming dehydrated. Dehydration can make you tired and thirsty, cause you to have a dry mouth, and decrease how often you urinate. Older adults and people with other diseases or a weak disease-fighting system (immune system) are at higher risk for dehydration. The main goals of treating your nausea are: To relieve your nausea. To limit repeated nausea episodes. To prevent vomiting and dehydration. Follow these instructions at home: Watch your symptoms for any changes. Tell your health care provider about them. Eating and drinking     Take an oral rehydration solution (ORS). This is a drink that is sold at pharmacies and retail stores. Drink clear fluids slowly and in small amounts as you are able. Clear fluids include water, ice chips, low-calorie sports drinks, and fruit juice that has water added (diluted fruit juice). Eat bland, easy-to-digest foods in small amounts as you are able. These foods include bananas, applesauce, rice, lean meats, toast, and crackers. Avoid drinking fluids that contain a lot of sugar or caffeine, such as energy drinks, sports drinks, and soda. Avoid alcohol. Avoid spicy or fatty foods. General instructions Take over-the-counter and prescription medicines only as told by your health care provider. Rest at home while you recover. Drink enough fluid to keep your urine pale yellow. Breathe slowly and deeply when you feel nauseous. Avoid smelling things that have strong odors. Wash your hands often using  soap and water for at least 20 seconds. If soap and water are not available, use hand sanitizer. Make sure that everyone in your household washes their hands well and often. Keep all follow-up visits. This is important. Contact a health care provider if: Your nausea gets worse. Your nausea does not go away after two days. You vomit multiple times. You cannot drink fluids without vomiting. You have any of the following: New symptoms. A fever. A headache. Muscle cramps. A rash. Pain while urinating. You feel light-headed or dizzy. Get help right away if: You have pain in your chest, neck, arm, or jaw. You feel extremely weak or you faint. You have vomit that is bright red or looks like coffee grounds. You have bloody or black stools (feces) or stools that look like tar. You have a severe headache, a stiff neck, or both. You have severe pain, cramping, or bloating in your abdomen. You have difficulty breathing or are breathing very quickly. Your heart is beating very quickly. Your skin feels cold and clammy. You feel confused. You have signs of dehydration, such as: Dark urine, very little urine, or no urine. Cracked lips. Dry mouth. Sunken eyes. Sleepiness. Weakness. These symptoms may be an emergency. Get help right away. Call 911. Do not wait to see if the symptoms will go away. Do not drive yourself to the hospital. Summary Nausea is the feeling that you have an upset stomach or that you are about to vomit. Nausea on its own is not usually a serious concern, but it may be an early sign of a more  serious medical problem. If vomiting develops, or if you are not able to drink enough fluids, you are at risk of becoming dehydrated. Follow recommendations for eating and drinking and take over-the-counter and prescription medicines only as told by your health care provider. Contact a health care provider right away if your symptoms worsen or you have new symptoms. Keep all  follow-up visits. This is important. This information is not intended to replace advice given to you by your health care provider. Make sure you discuss any questions you have with your health care provider. Document Revised: 03/18/2021 Document Reviewed: 03/18/2021 Elsevier Patient Education  2024 [employer].

## 2023-04-10 NOTE — Transitions of Care (Post Inpatient/ED Visit) (Signed)
04/10/2023  Name: Hannah Ballard MRN: [id number] DOB: Nov 01, 1980  Today's TOC FU Call Status: Today's TOC FU Call Status:: Successful TOC FU Call Competed TOC FU Call Complete Date: 04/10/23  Transition Care Management Follow-up Telephone Call Date of Discharge: 04/09/23 Discharge Facility[facility]) Type of Discharge: Inpatient Admission Primary Inpatient Discharge Diagnosis:: nausea/ vomiting How have you been since you were released from the hospital?: Same Any questions or concerns?: No  Items Reviewed: Did you receive and understand the discharge instructions provided?: Yes Medications obtained,verified, and reconciled?: Yes (Medications Reviewed) Any new allergies since your discharge?: No Dietary orders reviewed?: Yes Do you have support at home?: Yes People in Home: spouse  Medications Reviewed Today: Medications Reviewed Today     Reviewed by Karena Addison, LPN (Licensed Practical Nurse) on 04/10/23 at [phone]  Med List Status: <None>   Medication Order Taking? Sig Documenting Provider Last Dose Status Informant  bisacodyl (DULCOLAX) 5 MG EC tablet [id number]  Take 1 tablet (5 mg total) by mouth daily as needed for moderate constipation. Alwyn Ren, MD  Active   butalbital-acetaminophen-caffeine [facility]) [phone] MG tablet [id number] No Take 1 tablet by mouth every 6 (six) hours as needed for headache. Chinita Pester, FNP Past Month Active Self  cephALEXin (KEFLEX) 500 MG capsule [id number]  Take 1 capsule (500 mg total) by mouth 4 (four) times daily for 5 days. Alwyn Ren, MD  Active   cyclopentolate (CYCLODRYL,CYCLOGYL) 1 % ophthalmic solution [id number]  Place 1 drop into the right eye 3 (three) times daily. Lurene Shadow, MD  Active Self  fluticasone [facility]) 50 MCG/ACT nasal spray [id number] No Place 2 sprays into both nostrils once daily as needed for Rhinitis  unknown Active Self  gatifloxacin (ZYMAXID) 0.5 % SOLN [id number] No Place 1 drop  into the right eye every hour. Lurene Shadow, MD 03/20/2023 Active Self  HYDROcodone-acetaminophen (NORCO/VICODIN) 5-325 MG tablet [id number]  Take 1 tablet by mouth every 6 (six) hours as needed for moderate pain. Alwyn Ren, MD  Active   levothyroxine (SYNTHROID) 125 MCG tablet [id number]  Take 1 tablet (125 mcg total) by mouth daily. Alwyn Ren, MD  Active   LORazepam (ATIVAN) 0.5 MG tablet [id number] No Take 0.5 mg by mouth 3 (three) times daily as needed for anxiety. [provider] Past Month Active Self  meclizine (ANTIVERT) 25 MG tablet [id number] No Take 0.5 tablets (12.5 mg total) by mouth every 8 (eight) hours as needed.  Past Week Active Self  metoCLOPramide (REGLAN) 10 MG tablet [id number]  Take 1 tablet (10 mg total) by mouth every 6 (six) hours as needed for nausea. Alwyn Ren, MD  Active   ondansetron (ZOFRAN-ODT) 4 MG disintegrating tablet [id number]  Take 1 tablet (4 mg total) by mouth every 8 (eight) hours as needed. Alwyn Ren, MD  Active   pantoprazole (PROTONIX) 40 MG tablet [id number] No Take 1 tablet (40 mg total) by mouth daily.  Patient taking differently: Take 40 mg by mouth 2 (two) times daily.   [name], NP Past Week Active Self  polyethylene glycol (MIRALAX / GLYCOLAX) 17 g packet [id number]  Take 17 grams (mixed in 8 oz liquid) by mouth daily. Alwyn Ren, MD  Active   prochlorperazine (COMPAZINE) 25 MG suppository [id number] No Place 1 suppository (25 mg total) rectally every 12 (twelve) hours as needed for nausea Viviano Simas, FNP Past Week Active Self  promethazine (PHENERGAN) 25 MG tablet [id number] No Take 1 tablet (  25 mg total) by mouth every 8 (eight) hours as needed for nausea or vomiting. Lurene Shadow, MD Past Week Active Self  tirzepatide (ZEPBOUND) 2.5 MG/0.5ML Pen [id number] No Inject 2.5 mg into the skin once a week. [provider] Past Month Active Self           Med Note (SATTER[place],  Genoveva Ill   Tue Mar 20, 2023  9:06 PM) Usually take on Saturdays            Home Care and Equipment/Supplies: Were Home Health Services Ordered?: NA Any new equipment or medical supplies ordered?: NA  Functional Questionnaire: Do you need assistance with bathing/showering or dressing?: No Do you need assistance with meal preparation?: No Do you need assistance with eating?: No Do you have difficulty maintaining continence: No Do you need assistance with getting out of bed/getting out of a chair/moving?: No Do you have difficulty managing or taking your medications?: No  Follow up appointments reviewed: PCP Follow-up appointment confirmed?: Yes Date of PCP follow-up appointment?: 04/10/23 Follow-up Provider: [facility] Follow-up appointment confirmed?: NA Do you need transportation to your follow-up appointment?: No Do you understand care options if your condition(s) worsen?: Yes-patient verbalized understanding    SIGNATURE Karena Addison, LPN [facility] Nurse Health Advisor Direct Dial [phone]

## 2023-04-10 NOTE — ED Triage Notes (Signed)
Pt here with right arm cellulitis. Pt has a follow up appt on this Thurs but has been severely nauseous and cannot keep anything down including her abx. Pt also having fever at home. Pt ambulatory to triage.

## 2023-04-10 NOTE — ED Notes (Signed)
See triage notes. Patient has cellulitis to the right arm. Patient is currently on antibiotics but is unable to keep them down.

## 2023-04-10 NOTE — ED Notes (Signed)
Pt wants blood drawn in room due to poor vasculature. Pt states she has had 3 midlines in the past.

## 2023-04-10 NOTE — Discharge Instructions (Addendum)
You were seen in the emergency room today for evaluation of your vomiting.  Your labs are reassuring.  We discussed admitting you to the hospital, but you did prefer to go home to keep your scheduled follow-up with your surgeon.  Please make sure to attend this.  Return to the ER for new or worsening symptoms.

## 2023-04-10 NOTE — Progress Notes (Signed)
Established Patient Office Visit  Subjective:  Patient ID: Hannah Ballard, female    DOB: [birth year]  Age: 42 y.o. MRN: [id number]  CC:  Chief Complaint  Patient presents with   Hospitalization Follow-up    Patient is still having issues such as headache with pain 8/10, fever, vomiting, can not keep anything down not even her medications    HPI  Naevia Ballard presents for hospital follow-up.  She was discharged from the hospital yesterday.  She was admitted due to nausea, vomiting and headache.  She has history of gastric bypass surgery.  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   Patient states that she is not able to keep anything.  She had nausea and vomiting since morning.  She had Zofran and antiemetic suppository.  She said that she is not able to take her medication.  She was not able to take cephalexin since discharge for cellulitis due to nausea.  HPI   Past Medical History:  Diagnosis Date   Cancer (HCC)    Hypertension    Migraines    Thyroid disease     Past Surgical History:  Procedure Laterality Date   ABDOMINAL HYSTERECTOMY  2018   ANKLE SURGERY Left    has hardware   ANTERIOR FUSION CERVICAL SPINE  2008   APPENDECTOMY  1997   BIOPSY  04/07/2023   Procedure: BIOPSY;  Surgeon: [name], MD;  Location: [facility];  Service: Gastroenterology;;   BREAST BIOPSY     BREAST BIOPSY Right 03/12/2023   MM RT BREAST BX W LOC DEV 1ST LESION IMAGE BX SPEC STEREO GUIDE 03/12/2023 [facility]   CHOLECYSTECTOMY     ESOPHAGOGASTRODUODENOSCOPY (EGD) WITH PROPOFOL N/A 04/07/2023   Procedure: ESOPHAGOGASTRODUODENOSCOPY (EGD) WITH PROPOFOL;  Surgeon: [name], MD;  Location: [facility];  Service: Gastroenterology;  Laterality: N/A;   FRACTURE SURGERY     SHOULDER SURGERY Left    has pins   SPINE SURGERY      Family History  Problem Relation Age of Onset   Healthy Mother    Healthy Father     Social History   Socioeconomic History    Marital status: Married    Spouse name: Not on file   Number of children: Not on file   Years of education: Not on file   Highest education level: Associate degree: occupational, Scientist, product/process development, or vocational program  Occupational History   Not on file  Tobacco Use   Smoking status: Former    Current packs/day: 0.00    Types: Cigarettes    Quit date: 2019    Years since quitting: 5.5   Smokeless tobacco: Never  Vaping Use   Vaping status: Never Used  Substance and Sexual Activity   Alcohol use: Never   Drug use: Never   Sexual activity: Yes  Other Topics Concern   Not on file  Social History Narrative   Not on file   Social Determinants of Health   Financial Resource Strain: Medium Risk (04/13/2023)   Received from [facility]   Overall Financial Resource Strain (CARDIA)    Difficulty of Paying Living Expenses: Somewhat hard  Food Insecurity: No Food Insecurity (04/13/2023)   Received from [facility]   Hunger Vital Sign    Worried About Running Out of Food in the Last Year: Never true    Ran Out of Food in the Last Year: Never true  Recent Concern: Food Insecurity - Food Insecurity Present (  03/30/2023)   Hunger Vital Sign    Worried About Running Out of Food in the Last Year: Sometimes true    Ran Out of Food in the Last Year: Never true  Transportation Needs: Unmet Transportation Needs (04/13/2023)   Received from [facility] - Transportation    In the past 12 months, has lack of transportation kept you from medical appointments or from getting medications?: Yes    Lack of Transportation (Non-Medical): No  Physical Activity: Sufficiently Active (02/07/2023)   Exercise Vital Sign    Days of Exercise per Week: 5 days    Minutes of Exercise per Session: 60 min  Stress: Stress Concern Present (02/07/2023)   [employer] [facility] [employer] - Occupational Stress Questionnaire    Feeling of Stress : Very much   Social Connections: Unknown (03/13/2023)   Received from [facility]   Social Connections    How often do you feel lonely or isolated from those around you? (Adult - for ages 74 years and over): Not on file  Intimate Partner Violence: Not At Risk (03/30/2023)   Humiliation, Afraid, Rape, and Kick questionnaire    Fear of Current or Ex-Partner: No    Emotionally Abused: No    Physically Abused: No    Sexually Abused: No     Outpatient Medications Prior to Visit  Medication Sig Dispense Refill   bisacodyl (DULCOLAX) 5 MG EC tablet Take 1 tablet (5 mg total) by mouth daily as needed for moderate constipation. 100 tablet 0   butalbital-acetaminophen-caffeine (FIORICET) 50-325-40 MG tablet Take 1 tablet by mouth every 6 (six) hours as needed for headache. 12 tablet 0   cephALEXin (KEFLEX) 500 MG capsule Take 1 capsule (500 mg total) by mouth 4 (four) times daily for 5 days. 20 capsule 0   fluticasone (FLONASE) 50 MCG/ACT nasal spray Place 2 sprays into both nostrils once daily as needed for Rhinitis 16 g 0   HYDROcodone-acetaminophen (NORCO/VICODIN) 5-325 MG tablet Take 1 tablet by mouth every 6 (six) hours as needed for moderate pain. 30 tablet 0   levothyroxine (SYNTHROID) 125 MCG tablet Take 1 tablet (125 mcg total) by mouth daily. 30 tablet 11   meclizine (ANTIVERT) 25 MG tablet Take 0.5 tablets (12.5 mg total) by mouth every 8 (eight) hours as needed. 15 tablet 0   metoCLOPramide (REGLAN) 10 MG tablet Take 1 tablet (10 mg total) by mouth every 6 (six) hours as needed for nausea. 30 tablet 1   ondansetron (ZOFRAN-ODT) 4 MG disintegrating tablet Take 1 tablet (4 mg total) by mouth every 8 (eight) hours as needed. 30 tablet 0   pantoprazole (PROTONIX) 40 MG tablet Take 1 tablet (40 mg total) by mouth daily. (Patient taking differently: Take 40 mg by mouth 2 (two) times daily.) 30 tablet 0   polyethylene glycol (MIRALAX / GLYCOLAX) 17 g packet Take 17 grams (mixed in 8 oz liquid) by mouth daily. 14  each 0   prochlorperazine (COMPAZINE) 25 MG suppository Place 1 suppository (25 mg total) rectally every 12 (twelve) hours as needed for nausea 12 suppository 0   promethazine (PHENERGAN) 25 MG tablet Take 1 tablet (25 mg total) by mouth every 8 (eight) hours as needed for nausea or vomiting. 10 tablet 0   tirzepatide (ZEPBOUND) 2.5 MG/0.5ML Pen Inject 2.5 mg into the skin once a week.     cyclopentolate (CYCLODRYL,CYCLOGYL) 1 % ophthalmic solution Place 1 drop into the right eye 3 (three) times daily. 2  mL 0   gatifloxacin (ZYMAXID) 0.5 % SOLN Place 1 drop into the right eye every hour.     LORazepam (ATIVAN) 0.5 MG tablet Take 0.5 mg by mouth 3 (three) times daily as needed for anxiety.     No facility-administered medications prior to visit.    Allergies  Allergen Reactions   Aspirin Swelling and Anaphylaxis    Tongue swelling Other reaction(s): Edema face/lips/tongue Facial swelling    Morphine Anaphylaxis and Swelling    ROS Review of Systems Negative unless indicated in HPI.    Objective:    Physical Exam Constitutional:      Appearance: She is ill-appearing.  HENT:     Nose: Nose normal.  Eyes:     Pupils: Pupils are equal, round, and reactive to light.  Cardiovascular:     Rate and Rhythm: Normal rate and regular rhythm.     Pulses: Normal pulses.     Heart sounds: Normal heart sounds.  Pulmonary:     Effort: Pulmonary effort is normal.     Breath sounds: Normal breath sounds. No wheezing.  Abdominal:     General: Bowel sounds are normal.     Palpations: Abdomen is soft.     Tenderness: There is abdominal tenderness (Generalized tenderness).  Musculoskeletal:     Comments: Slight redness to the right upper extremity  Skin:    General: Skin is warm.  Neurological:     General: No focal deficit present.     Mental Status: She is alert and oriented to person, place, and time. Mental status is at baseline.  Psychiatric:        Mood and Affect: Mood normal.         Behavior: Behavior normal.        Thought Content: Thought content normal.        Judgment: Judgment normal.     BP 134/84   Pulse 100   Temp 99.6 F (37.6 C)   Ht 5\' 9"  (1.753 m)   Wt 225 lb 12.8 oz (102.4 kg)   SpO2 97%   BMI 33.34 kg/m  Wt Readings from Last 3 Encounters:  04/10/23 225 lb 12 oz (102.4 kg)  04/10/23 225 lb 12.8 oz (102.4 kg)  03/30/23 231 lb 0.7 oz (104.8 kg)     Health Maintenance  Topic Date Due   Hepatitis C Screening  Never done   PAP SMEAR-Modifier  Never done   COVID-19 Vaccine (1 - 2023-24 season) 04/26/2023 (Originally 05/26/2022)   INFLUENZA VACCINE  04/26/2023   DTaP/Tdap/Td (2 - Td or Tdap) 12/31/2023   HIV Screening  Completed   HPV VACCINES  Aged Out    There are no preventive care reminders to display for this patient.  Lab Results  Component Value Date   TSH 41.514 (H) 04/01/2023   Lab Results  Component Value Date   WBC 7.2 04/10/2023   HGB 12.2 04/10/2023   HCT 36.8 04/10/2023   MCV 94.1 04/10/2023   PLT 293 04/10/2023   Lab Results  Component Value Date   NA 138 04/10/2023   K 3.7 04/10/2023   CO2 23 04/10/2023   GLUCOSE 88 04/10/2023   BUN 10 04/10/2023   CREATININE 0.81 04/10/2023   BILITOT 0.6 04/10/2023   ALKPHOS 95 04/10/2023   AST 16 04/10/2023   ALT 24 04/10/2023   PROT 6.9 04/10/2023   ALBUMIN 3.7 04/10/2023   CALCIUM 7.3 (L) 04/10/2023   ANIONGAP 8 04/10/2023   GFR 99.17  02/07/2023   Lab Results  Component Value Date   CHOL 171 02/07/2023   Lab Results  Component Value Date   HDL 65.30 02/07/2023   Lab Results  Component Value Date   LDLCALC 88 02/07/2023   Lab Results  Component Value Date   TRIG 88.0 02/07/2023   Lab Results  Component Value Date   CHOLHDL 3 02/07/2023   No results found for: "HGBA1C"    Assessment & Plan:  Hypokalemia Assessment & Plan: Advised patient to consume food rich in potassium. BMP ordered.  Orders: -     Basic metabolic  panel  Nausea Assessment & Plan: Advised patient to use antiemetic. Advised patient to consume Pedialyte, Gatorade as tolerated. If not able to keep anything and have continuous nausea would recommend seeking emergent care for IV hydration.   Gastrogastric fistula Assessment & Plan: Appointment scheduled with bariatric surgeon. Will follow-up with Dr. Artist Pais on 04/12/2023.   Elevated TSH Assessment & Plan: Elevated TSH.  Will check TSH, free T3 and free T4  Orders: -     TSH; Future -     T3, free; Future -     T4, free; Future    Follow-up: Return in about 4 weeks (around 05/08/2023).   [name], NP

## 2023-04-10 NOTE — ED Provider Notes (Signed)
[facility] Provider Note    Event Date/Time   First MD Initiated Contact with Patient 04/10/23 1548     (approximate)   History   Arm Swelling   HPI  Hannah Ballard is a 42 y.o. female with history of Roux-en-Y bypass presenting to the emergency department for evaluation of nausea and vomiting.  Patient recently admitted from 7/5 to 7/15 at outside hospital for intractable nausea and vomiting.  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  During her admission she was found to have a superficial thrombophlebitis with associated cellulitis of her right upper extremity in the location of a midline.  She was discharged home on Keflex.  Patient reports she has been having issues tolerating her home p.o. meds due to ongoing nausea and vomiting.  She saw her primary care doctor today.  She was able to arrange follow-up with her surgeon for Thursday, but it was recommended that she present to the ER for fluid resuscitation and IV antibiotics until she is able to attend her appointment.  Patient denies fevers.  Reports some ongoing upper abdominal pain, not significantly changed since her recent admission.    Physical Exam   Triage Vital Signs: ED Triage Vitals  Encounter Vitals Group     BP 04/10/23 1538 (!) 168/118     Systolic BP Percentile --      Diastolic BP Percentile --      Pulse Rate 04/10/23 1538 95     Resp 04/10/23 1538 18     Temp 04/10/23 1538 99.2 F (37.3 C)     Temp Source 04/10/23 1538 Oral     SpO2 04/10/23 1538 97 %     Weight 04/10/23 1539 225 lb 12 oz (102.4 kg)     Height 04/10/23 1539 5\' 9"  (1.753 m)     Head Circumference --      Peak Flow --      Pain Score 04/10/23 1538 6     Pain Loc --      Pain Education --      Exclude from Growth Chart --     Most recent vital signs: Vitals:   04/10/23 1538 04/10/23 2001  BP: (!) 168/118 (!) 155/89  Pulse: 95 91  Resp: 18  18  Temp: 99.2 F (37.3 C)   SpO2: 97% 99%     General: Awake, interactive  CV:  Regular rate, good peripheral perfusion.  Resp:  Lungs clear, unlabored respirations.  Abd:  Soft, nondistended, mild tenderness over the upper abdomen without rebound or guarding Neuro:  Symmetric facial movement, fluid speech MSK:  There is some faint erythema of the right upper extremity.  There is an area of skin marked and there is a small area of erythema extending beyond the marked border along the inferior medial aspect, but is primarily contained within previous area of marking.   ED Results / Procedures / Treatments   Labs (all labs ordered are listed, but only abnormal results are displayed) Labs Reviewed  COMPREHENSIVE METABOLIC PANEL - Abnormal; Notable for the following components:      Result Value   Calcium 7.3 (*)    All other components within normal limits  CBC WITH DIFFERENTIAL/PLATELET  LIPASE, BLOOD     EKG EKG independently reviewed interpreted by myself (ER attending) demonstrates:    RADIOLOGY Imaging independently reviewed and interpreted by myself demonstrates:    PROCEDURES:  Critical Care performed: No  Procedures   MEDICATIONS ORDERED IN ED: Medications  ondansetron (ZOFRAN) injection 4 mg (has no administration in time range)  sodium chloride 0.9 % bolus 1,000 mL (0 mLs Intravenous Stopped 04/10/23 1944)  metoCLOPramide (REGLAN) injection 10 mg (10 mg Intravenous Given 04/10/23 1749)  ketorolac (TORADOL) 15 MG/ML injection 15 mg (15 mg Intravenous Given 04/10/23 1749)  ondansetron (ZOFRAN) injection 4 mg (4 mg Intravenous Given 04/10/23 1906)  HYDROmorphone (DILAUDID) injection 0.5 mg (0.5 mg Intravenous Given 04/10/23 1940)  cefTRIAXone (ROCEPHIN) 2 g in sodium chloride 0.9 % 100 mL IVPB (2 g Intravenous New Bag/Given 04/10/23 1942)     IMPRESSION / MDM / ASSESSMENT AND PLAN / ED COURSE  I reviewed the triage vital signs and the nursing  notes.  Differential diagnosis includes, but is not limited to, electrolyte abnormality, dehydration, ongoing symptoms secondary to known gastric fistula  Patient's presentation is most consistent with acute presentation with potential threat to life or bodily function.  42 year old female presenting with poor p.o. tolerance after recent admission for the same.  Patient has follow-up with her surgeon in 2 days.  She has had a recent prolonged admission and would like to avoid readmission.  Labs here are reassuring.  She was treated symptomatically with Toradol, Reglan, and fluids.  She did have some ongoing nausea for which she was given Zofran.  Did report improvement with this.  Did have some worsening pain in her arm after multiple lab draw times, was given a single dose of Dilaudid.  With her poor tolerance of oral antibiotics, I did give her a dose of IV Rocephin.  She is tolerating some p.o.  She is not interested in readmission.  I do think being able to attend her outpatient follow-up with her surgeon who is not at this hospital will be most beneficial for her for long-term management of her symptoms.  With shared decision making, patient was discharged in stable condition with plans for close outpatient follow-up.  Strict return precautions provided.     FINAL CLINICAL IMPRESSION(S) / ED DIAGNOSES   Final diagnoses:  Nausea and vomiting, unspecified vomiting type     Rx / DC Orders   ED Discharge Orders     None        Note:  This document was prepared using Dragon voice recognition software and may include unintentional dictation errors.   Trinna Post, MD 04/10/23 2015

## 2023-04-12 DIAGNOSIS — Z9884 Bariatric surgery status: Secondary | ICD-10-CM | POA: Diagnosis not present

## 2023-04-12 DIAGNOSIS — R112 Nausea with vomiting, unspecified: Secondary | ICD-10-CM | POA: Diagnosis not present

## 2023-04-12 DIAGNOSIS — R519 Headache, unspecified: Secondary | ICD-10-CM | POA: Diagnosis not present

## 2023-04-13 DIAGNOSIS — H539 Unspecified visual disturbance: Secondary | ICD-10-CM | POA: Diagnosis not present

## 2023-04-13 DIAGNOSIS — I82612 Acute embolism and thrombosis of superficial veins of left upper extremity: Secondary | ICD-10-CM | POA: Diagnosis not present

## 2023-04-13 DIAGNOSIS — H018 Other specified inflammations of eyelid: Secondary | ICD-10-CM | POA: Diagnosis not present

## 2023-04-13 DIAGNOSIS — Z452 Encounter for adjustment and management of vascular access device: Secondary | ICD-10-CM | POA: Diagnosis not present

## 2023-04-13 DIAGNOSIS — H5711 Ocular pain, right eye: Secondary | ICD-10-CM | POA: Diagnosis not present

## 2023-04-13 DIAGNOSIS — R519 Headache, unspecified: Secondary | ICD-10-CM | POA: Diagnosis not present

## 2023-04-13 DIAGNOSIS — Z9884 Bariatric surgery status: Secondary | ICD-10-CM | POA: Diagnosis not present

## 2023-04-13 DIAGNOSIS — M7989 Other specified soft tissue disorders: Secondary | ICD-10-CM | POA: Diagnosis not present

## 2023-04-13 DIAGNOSIS — Z4682 Encounter for fitting and adjustment of non-vascular catheter: Secondary | ICD-10-CM | POA: Diagnosis not present

## 2023-04-13 DIAGNOSIS — I808 Phlebitis and thrombophlebitis of other sites: Secondary | ICD-10-CM | POA: Diagnosis not present

## 2023-04-13 DIAGNOSIS — H16001 Unspecified corneal ulcer, right eye: Secondary | ICD-10-CM | POA: Diagnosis not present

## 2023-04-13 DIAGNOSIS — R112 Nausea with vomiting, unspecified: Secondary | ICD-10-CM | POA: Diagnosis not present

## 2023-04-14 DIAGNOSIS — Z452 Encounter for adjustment and management of vascular access device: Secondary | ICD-10-CM | POA: Diagnosis not present

## 2023-04-14 DIAGNOSIS — R112 Nausea with vomiting, unspecified: Secondary | ICD-10-CM | POA: Diagnosis not present

## 2023-04-14 DIAGNOSIS — M542 Cervicalgia: Secondary | ICD-10-CM | POA: Diagnosis not present

## 2023-04-14 DIAGNOSIS — R109 Unspecified abdominal pain: Secondary | ICD-10-CM | POA: Diagnosis not present

## 2023-04-14 DIAGNOSIS — G44211 Episodic tension-type headache, intractable: Secondary | ICD-10-CM | POA: Diagnosis not present

## 2023-04-14 DIAGNOSIS — R519 Headache, unspecified: Secondary | ICD-10-CM | POA: Diagnosis not present

## 2023-04-15 DIAGNOSIS — R112 Nausea with vomiting, unspecified: Secondary | ICD-10-CM | POA: Diagnosis not present

## 2023-04-16 DIAGNOSIS — R112 Nausea with vomiting, unspecified: Secondary | ICD-10-CM | POA: Diagnosis not present

## 2023-04-16 DIAGNOSIS — H16001 Unspecified corneal ulcer, right eye: Secondary | ICD-10-CM | POA: Diagnosis not present

## 2023-04-16 DIAGNOSIS — R519 Headache, unspecified: Secondary | ICD-10-CM | POA: Diagnosis not present

## 2023-04-17 DIAGNOSIS — H16001 Unspecified corneal ulcer, right eye: Secondary | ICD-10-CM | POA: Diagnosis not present

## 2023-04-17 DIAGNOSIS — R519 Headache, unspecified: Secondary | ICD-10-CM | POA: Diagnosis not present

## 2023-04-17 DIAGNOSIS — R112 Nausea with vomiting, unspecified: Secondary | ICD-10-CM | POA: Diagnosis not present

## 2023-04-18 DIAGNOSIS — Z8639 Personal history of other endocrine, nutritional and metabolic disease: Secondary | ICD-10-CM | POA: Diagnosis not present

## 2023-04-18 DIAGNOSIS — R112 Nausea with vomiting, unspecified: Secondary | ICD-10-CM | POA: Diagnosis not present

## 2023-04-18 DIAGNOSIS — Z9884 Bariatric surgery status: Secondary | ICD-10-CM | POA: Diagnosis not present

## 2023-04-18 DIAGNOSIS — E039 Hypothyroidism, unspecified: Secondary | ICD-10-CM | POA: Diagnosis not present

## 2023-04-18 DIAGNOSIS — E890 Postprocedural hypothyroidism: Secondary | ICD-10-CM | POA: Diagnosis not present

## 2023-04-18 DIAGNOSIS — R519 Headache, unspecified: Secondary | ICD-10-CM | POA: Diagnosis not present

## 2023-04-18 DIAGNOSIS — F419 Anxiety disorder, unspecified: Secondary | ICD-10-CM | POA: Diagnosis not present

## 2023-04-19 DIAGNOSIS — R519 Headache, unspecified: Secondary | ICD-10-CM | POA: Diagnosis not present

## 2023-04-19 DIAGNOSIS — R112 Nausea with vomiting, unspecified: Secondary | ICD-10-CM | POA: Diagnosis not present

## 2023-04-19 DIAGNOSIS — Z8639 Personal history of other endocrine, nutritional and metabolic disease: Secondary | ICD-10-CM | POA: Diagnosis not present

## 2023-04-20 DIAGNOSIS — R109 Unspecified abdominal pain: Secondary | ICD-10-CM | POA: Diagnosis not present

## 2023-04-20 DIAGNOSIS — Z452 Encounter for adjustment and management of vascular access device: Secondary | ICD-10-CM | POA: Diagnosis not present

## 2023-04-20 DIAGNOSIS — R519 Headache, unspecified: Secondary | ICD-10-CM | POA: Diagnosis not present

## 2023-04-20 DIAGNOSIS — G44211 Episodic tension-type headache, intractable: Secondary | ICD-10-CM | POA: Diagnosis not present

## 2023-04-20 DIAGNOSIS — M542 Cervicalgia: Secondary | ICD-10-CM | POA: Diagnosis not present

## 2023-04-20 DIAGNOSIS — Z8639 Personal history of other endocrine, nutritional and metabolic disease: Secondary | ICD-10-CM | POA: Diagnosis not present

## 2023-04-20 DIAGNOSIS — R112 Nausea with vomiting, unspecified: Secondary | ICD-10-CM | POA: Diagnosis not present

## 2023-04-21 DIAGNOSIS — R112 Nausea with vomiting, unspecified: Secondary | ICD-10-CM | POA: Diagnosis not present

## 2023-04-21 DIAGNOSIS — Z8639 Personal history of other endocrine, nutritional and metabolic disease: Secondary | ICD-10-CM | POA: Diagnosis not present

## 2023-04-21 DIAGNOSIS — R519 Headache, unspecified: Secondary | ICD-10-CM | POA: Diagnosis not present

## 2023-04-22 DIAGNOSIS — Z8639 Personal history of other endocrine, nutritional and metabolic disease: Secondary | ICD-10-CM | POA: Diagnosis not present

## 2023-04-22 DIAGNOSIS — E876 Hypokalemia: Secondary | ICD-10-CM | POA: Insufficient documentation

## 2023-04-22 DIAGNOSIS — R7989 Other specified abnormal findings of blood chemistry: Secondary | ICD-10-CM | POA: Insufficient documentation

## 2023-04-22 DIAGNOSIS — R519 Headache, unspecified: Secondary | ICD-10-CM | POA: Diagnosis not present

## 2023-04-22 DIAGNOSIS — R112 Nausea with vomiting, unspecified: Secondary | ICD-10-CM | POA: Diagnosis not present

## 2023-04-22 DIAGNOSIS — K316 Fistula of stomach and duodenum: Secondary | ICD-10-CM | POA: Insufficient documentation

## 2023-04-22 NOTE — Assessment & Plan Note (Signed)
Appointment scheduled with bariatric surgeon. Will follow-up with Dr. Artist Pais on 04/12/2023.

## 2023-04-22 NOTE — Assessment & Plan Note (Signed)
Elevated TSH.  Will check TSH, free T3 and free T4

## 2023-04-22 NOTE — Assessment & Plan Note (Signed)
Advised patient to consume food rich in potassium. BMP ordered.

## 2023-04-22 NOTE — Assessment & Plan Note (Addendum)
Advised patient to use antiemetic. Advised patient to consume Pedialyte, Gatorade as tolerated. If not able to keep anything and have continuous nausea would recommend seeking emergent care for IV hydration.

## 2023-04-23 DIAGNOSIS — R519 Headache, unspecified: Secondary | ICD-10-CM | POA: Diagnosis not present

## 2023-04-23 DIAGNOSIS — M542 Cervicalgia: Secondary | ICD-10-CM | POA: Diagnosis not present

## 2023-04-23 DIAGNOSIS — R109 Unspecified abdominal pain: Secondary | ICD-10-CM | POA: Diagnosis not present

## 2023-04-23 DIAGNOSIS — G44211 Episodic tension-type headache, intractable: Secondary | ICD-10-CM | POA: Diagnosis not present

## 2023-04-23 DIAGNOSIS — Z452 Encounter for adjustment and management of vascular access device: Secondary | ICD-10-CM | POA: Diagnosis not present

## 2023-04-23 DIAGNOSIS — Z8639 Personal history of other endocrine, nutritional and metabolic disease: Secondary | ICD-10-CM | POA: Diagnosis not present

## 2023-04-23 DIAGNOSIS — R112 Nausea with vomiting, unspecified: Secondary | ICD-10-CM | POA: Diagnosis not present

## 2023-04-24 DIAGNOSIS — H5711 Ocular pain, right eye: Secondary | ICD-10-CM | POA: Diagnosis not present

## 2023-04-24 DIAGNOSIS — R519 Headache, unspecified: Secondary | ICD-10-CM | POA: Diagnosis not present

## 2023-04-24 DIAGNOSIS — Z4682 Encounter for fitting and adjustment of non-vascular catheter: Secondary | ICD-10-CM | POA: Diagnosis not present

## 2023-04-24 DIAGNOSIS — Z9884 Bariatric surgery status: Secondary | ICD-10-CM | POA: Diagnosis not present

## 2023-04-24 DIAGNOSIS — I82612 Acute embolism and thrombosis of superficial veins of left upper extremity: Secondary | ICD-10-CM | POA: Diagnosis not present

## 2023-04-24 DIAGNOSIS — Z452 Encounter for adjustment and management of vascular access device: Secondary | ICD-10-CM | POA: Diagnosis not present

## 2023-04-24 DIAGNOSIS — H018 Other specified inflammations of eyelid: Secondary | ICD-10-CM | POA: Diagnosis not present

## 2023-04-24 DIAGNOSIS — H16001 Unspecified corneal ulcer, right eye: Secondary | ICD-10-CM | POA: Diagnosis not present

## 2023-04-24 DIAGNOSIS — R112 Nausea with vomiting, unspecified: Secondary | ICD-10-CM | POA: Diagnosis not present

## 2023-04-24 DIAGNOSIS — H539 Unspecified visual disturbance: Secondary | ICD-10-CM | POA: Diagnosis not present

## 2023-04-25 DIAGNOSIS — R519 Headache, unspecified: Secondary | ICD-10-CM | POA: Diagnosis not present

## 2023-04-25 DIAGNOSIS — H16001 Unspecified corneal ulcer, right eye: Secondary | ICD-10-CM | POA: Diagnosis not present

## 2023-04-25 DIAGNOSIS — R112 Nausea with vomiting, unspecified: Secondary | ICD-10-CM | POA: Diagnosis not present

## 2023-04-30 ENCOUNTER — Other Ambulatory Visit (HOSPITAL_BASED_OUTPATIENT_CLINIC_OR_DEPARTMENT_OTHER): Payer: Self-pay

## 2023-05-03 ENCOUNTER — Telehealth: Payer: Self-pay

## 2023-05-03 NOTE — Transitions of Care (Post Inpatient/ED Visit) (Signed)
   05/03/2023  Name: Hannah Ballard MRN: [id number] DOB: August 09, 1981  Today's TOC FU Call Status: Today's TOC FU Call Status:: Unsuccessful Call (1st Attempt) Unsuccessful Call (1st Attempt) Date: 05/03/23  Attempted to reach the patient regarding the most recent Inpatient/ED visit.  Follow Up Plan: Additional outreach attempts will be made to reach the patient to complete the Transitions of Care (Post Inpatient/ED visit) call.   Signature Karena Addison, LPN [facility] Nurse Health Advisor Direct Dial [phone]

## 2023-05-04 NOTE — Transitions of Care (Post Inpatient/ED Visit) (Signed)
   05/04/2023  Name: Timika Setaro MRN: [id number] DOB: [birth year]  Today's TOC FU Call Status: Today's TOC FU Call Status:: Successful TOC FU Call Completed Unsuccessful Call (1st Attempt) Date: 05/03/23 [facility] FU Call Complete Date: 05/04/23  Transition Care Management Follow-up Telephone Call Date of Discharge: 05/02/23 Discharge Facility: Other ([facility]) Name of Other ([facility]) [facility]charge Facility: [facility] Type of Discharge: Inpatient Admission Primary Inpatient Discharge Diagnosis:: nausea and vomiting How have you been since you were released from the hospital?: Same Any questions or concerns?: No  Items Reviewed: Did you receive and understand the discharge instructions provided?: Yes Medications obtained,verified, and reconciled?: Yes (Medications Reviewed) Any new allergies since your discharge?: No Dietary orders reviewed?: Yes Do you have support at home?: Yes People in Home: spouse  Medications Reviewed Today: Medications Reviewed Today   Medications were not reviewed in this encounter     Home Care and Equipment/Supplies: Were Home Health Services Ordered?: No Any new equipment or medical supplies ordered?: No  Functional Questionnaire: Do you need assistance with bathing/showering or dressing?: No Do you need assistance with meal preparation?: No Do you need assistance with eating?: No Do you have difficulty maintaining continence: No Do you need assistance with getting out of bed/getting out of a chair/moving?: No Do you have difficulty managing or taking your medications?: No  Follow up appointments reviewed: PCP Follow-up appointment confirmed?: Yes Specialist Hospital Follow-up appointment confirmed?: No Do you need transportation to your follow-up appointment?: No Do you understand care options if your condition(s) worsen?: Yes-patient verbalized understanding    SIGNATURE tb,cma

## 2023-05-09 ENCOUNTER — Encounter: Payer: Self-pay | Admitting: Nurse Practitioner

## 2023-05-15 ENCOUNTER — Telehealth: Payer: Self-pay

## 2023-05-15 NOTE — Transitions of Care (Post Inpatient/ED Visit) (Signed)
05/15/2023  Name: Hannah Ballard MRN: [id number] DOB: 1981/06/05  Today's TOC FU Call Status: Today's TOC FU Call Status:: Successful TOC FU Call Completed TOC FU Call Complete Date: 05/15/23  Transition Care Management Follow-up Telephone Call Date of Discharge: 05/14/23 Discharge Facility: Other [facility]) Name of Other ([facility]) Discharge Facility: [facility] Type of Discharge: Inpatient Admission Primary Inpatient Discharge Diagnosis:: N/V How have you been since you were released from the hospital?: Better Any questions or concerns?: No  Items Reviewed: Did you receive and understand the discharge instructions provided?: Yes Medications obtained,verified, and reconciled?: Yes (Medications Reviewed) Any new allergies since your discharge?: No Dietary orders reviewed?: Yes Do you have support at home?: Yes People in Home: spouse  Medications Reviewed Today: Medications Reviewed Today     Reviewed by Karena Addison, LPN (Licensed Practical Nurse) on 05/15/23 at [phone]  Med List Status: <None>   Medication Order Taking? Sig Documenting Provider Last Dose Status Informant  amitriptyline (ELAVIL) 25 MG tablet [id number] Yes Take 25 mg by mouth at bedtime. [provider] Taking Active   bisacodyl (DULCOLAX) 5 MG EC tablet [id number] Yes Take 1 tablet (5 mg total) by mouth daily as needed for moderate constipation. Alwyn Ren, MD Taking Active   butalbital-acetaminophen-caffeine [facility]) [phone] MG tablet [id number] No Take 1 tablet by mouth every 6 (six) hours as needed for headache.  Patient not taking: Reported on 05/15/2023   Chinita Pester, FNP Not Taking Active Self     Discontinued 05/02/23 1428 (External Source Cancellation) HYDROmorphone (DILAUDID) 2 MG tablet [id number] Yes Take 2 mg by mouth every 6 (six) hours as needed. [provider] Taking Active   levothyroxine (SYNTHROID) 125 MCG tablet [id number] Yes Take 1 tablet (125 mcg total) by  mouth daily. Alwyn Ren, MD Taking Active   LORazepam (ATIVAN) 0.5 MG tablet [id number] Yes Take 0.5 mg by mouth 2 (two) times daily. [provider] Taking Active   meclizine (ANTIVERT) 25 MG tablet [id number] Yes Take 0.5 tablets (12.5 mg total) by mouth every 8 (eight) hours as needed.  Taking Active Self  melatonin 3 MG TABS tablet [id number] Yes Take 3 mg by mouth at bedtime as needed. [provider] Taking Active   metoCLOPramide (REGLAN) 10 MG tablet [id number] Yes Take 1 tablet (10 mg total) by mouth every 6 (six) hours as needed for nausea. Alwyn Ren, MD Taking Active   naloxone [facility]) nasal spray 4 mg/0.1 mL [id number] Yes Place 0.4 mg into the nose once. [provider] Taking Active   ondansetron (ZOFRAN-ODT) 4 MG disintegrating tablet [id number] Yes Take 1 tablet (4 mg total) by mouth every 8 (eight) hours as needed. Alwyn Ren, MD Taking Active   pantoprazole (PROTONIX) 40 MG tablet [id number] Yes Take 1 tablet (40 mg total) by mouth daily.  Patient taking differently: Take 40 mg by mouth 2 (two) times daily.   [name], NP Taking Active Self  polyethylene glycol (MIRALAX / GLYCOLAX) 17 g packet [id number] No Take 17 grams (mixed in 8 oz liquid) by mouth daily.  Patient not taking: Reported on 05/15/2023   Alwyn Ren, MD Not Taking Active   prochlorperazine (COMPAZINE) 25 MG suppository [id number] Yes Place 1 suppository (25 mg total) rectally every 12 (twelve) hours as needed for nausea Viviano Simas, FNP Taking Active Self  promethazine (PHENERGAN) 25 MG tablet [id number] Yes Take 1 tablet (25 mg total) by mouth every 8 (eight) hours as needed for nausea or vomiting.  Lurene Shadow, MD Taking Active Self  tirzepatide [facility]) 2.5 MG/0.5ML Pen [id number] No Inject 2.5 mg into the skin once a week.  Patient not taking: Reported on 05/15/2023   [provider] Not Taking Active Self           Med Note  (SATTERFIELD, Genoveva Ill   Tue Mar 20, 2023  9:06 PM) Usually take on Saturdays            Home Care and Equipment/Supplies: Were Home Health Services Ordered?: Yes [facility] of [facility] Agency[facility] Has Agency set up a time to come to your home?: No Any new equipment or medical supplies ordered?: Yes Name of Medical supply agency?: unknown Were you able to get the equipment/medical supplies?: Yes Do you have any questions related to the use of the equipment/supplies?: No  Functional Questionnaire: Do you need assistance with bathing/showering or dressing?: No Do you need assistance with meal preparation?: No Do you need assistance with eating?: Yes Do you have difficulty maintaining continence: No Do you need assistance with getting out of bed/getting out of a chair/moving?: No Do you have difficulty managing or taking your medications?: No  Follow up appointments reviewed: PCP Follow-up appointment confirmed?: Yes Date of PCP follow-up appointment?: 05/18/23 Follow-up Provider: [facility] Follow-up appointment confirmed?: Yes Date of Specialist follow-up appointment?: 06/07/23 Follow-Up Specialty Provider:: Biatric surgeon Do you need transportation to your follow-up appointment?: No Do you understand care options if your condition(s) worsen?: Yes-patient verbalized understanding    SIGNATURE Karena Addison, LPN [facility] Nurse Health Advisor Direct Dial [phone]

## 2023-05-18 ENCOUNTER — Encounter: Payer: Self-pay | Admitting: Nurse Practitioner

## 2023-05-18 ENCOUNTER — Other Ambulatory Visit: Payer: Self-pay | Admitting: Nurse Practitioner

## 2023-05-18 ENCOUNTER — Other Ambulatory Visit: Payer: Self-pay

## 2023-05-18 ENCOUNTER — Ambulatory Visit (INDEPENDENT_AMBULATORY_CARE_PROVIDER_SITE_OTHER): Payer: Medicaid Other | Admitting: Nurse Practitioner

## 2023-05-18 VITALS — BP 120/80 | HR 105 | Temp 98.3°F | Ht 69.0 in | Wt 224.4 lb

## 2023-05-18 DIAGNOSIS — R600 Localized edema: Secondary | ICD-10-CM

## 2023-05-18 DIAGNOSIS — H5711 Ocular pain, right eye: Secondary | ICD-10-CM

## 2023-05-18 DIAGNOSIS — Z978 Presence of other specified devices: Secondary | ICD-10-CM

## 2023-05-18 DIAGNOSIS — R110 Nausea: Secondary | ICD-10-CM | POA: Diagnosis not present

## 2023-05-18 MED ORDER — CEFDINIR 300 MG PO CAPS: 300.0000 mg | ORAL_CAPSULE | Freq: Two times a day (BID) | ORAL | 0 refills | Status: DC

## 2023-05-18 MED ORDER — OXYCODONE-ACETAMINOPHEN 10-325 MG PO TABS: 1.0000 | ORAL_TABLET | ORAL | 0 refills | Status: DC | PRN

## 2023-05-18 MED ORDER — AMITRIPTYLINE HCL 25 MG PO TABS: 25.0000 mg | ORAL_TABLET | Freq: Every day | ORAL | 1 refills | Status: DC

## 2023-05-18 MED ORDER — LORAZEPAM 0.5 MG PO TABS: 0.5000 mg | ORAL_TABLET | Freq: Two times a day (BID) | ORAL | 0 refills | Status: AC

## 2023-05-18 NOTE — Patient Instructions (Signed)
Rx sent to pharmacy. Referral sent to pain management and gi

## 2023-05-18 NOTE — Progress Notes (Unsigned)
Established Patient Office Visit  Subjective:  Patient ID: Hannah Ballard, female    DOB: [birth year]  Age: 42 y.o. MRN: [id number]  CC:  Chief Complaint  Patient presents with   Hospitalization Follow-up    HPI  Hannah Ballard presents for hospital follow up. With nasal osatric tube.   She has vomited 3 times since she had be out ogf hospital.   3weeks to   Neuropthalamology and GI  HPI   Past Medical History:  Diagnosis Date   Cancer (HCC)    Hypertension    Migraines    Thyroid disease     Past Surgical History:  Procedure Laterality Date   ABDOMINAL HYSTERECTOMY  2018   ANKLE SURGERY Left    has hardware   ANTERIOR FUSION CERVICAL SPINE  2008   APPENDECTOMY  1997   BIOPSY  04/07/2023   Procedure: BIOPSY;  Surgeon: [name], MD;  Location: [facility];  Service: Gastroenterology;;   BREAST BIOPSY     BREAST BIOPSY Right 03/12/2023   MM RT BREAST BX W LOC DEV 1ST LESION IMAGE BX SPEC STEREO GUIDE 03/12/2023 [facility]   CHOLECYSTECTOMY     ESOPHAGOGASTRODUODENOSCOPY (EGD) WITH PROPOFOL N/A 04/07/2023   Procedure: ESOPHAGOGASTRODUODENOSCOPY (EGD) WITH PROPOFOL;  Surgeon: [name], MD;  Location: [facility];  Service: Gastroenterology;  Laterality: N/A;   FRACTURE SURGERY     SHOULDER SURGERY Left    has pins   SPINE SURGERY      Family History  Problem Relation Age of Onset   Healthy Mother    Healthy Father     Social History   Socioeconomic History   Marital status: Married    Spouse name: Not on file   Number of children: Not on file   Years of education: Not on file   Highest education level: Associate degree: occupational, Scientist, product/process development, or vocational program  Occupational History   Not on file  Tobacco Use   Smoking status: Former    Current packs/day: 0.00    Types: Cigarettes    Quit date: 2019    Years since quitting: 5.6   Smokeless tobacco: Never  Vaping Use   Vaping status: Never Used  Substance and Sexual Activity    Alcohol use: Never   Drug use: Never   Sexual activity: Yes  Other Topics Concern   Not on file  Social History Narrative   Not on file   Social Determinants of Health   Financial Resource Strain: High Risk (05/07/2023)   Received from [facility]   Overall Financial Resource Strain (CARDIA)    Difficulty of Paying Living Expenses: Very hard  Food Insecurity: Food Insecurity Present (05/07/2023)   [facility]   Hunger Vital Sign    Worried About Running Out of Food in the Last Year: Sometimes true    Ran Out of Food in the Last Year: Sometimes true  Transportation Needs: Unmet Transportation Needs (05/07/2023)   Received from [facility]   PRAPARE - Transportation    In the past 12 months, has lack of transportation kept you from medical appointments or from getting medications?: Yes    Lack of Transportation (Non-Medical): Yes  Physical Activity: Sufficiently Active (02/07/2023)   Exercise Vital Sign    Days of Exercise per Week: 5 days    Minutes of Exercise per Session: 60 min  Stress: Stress Concern Present (02/07/2023)   [employer] of Occupational Health - Occupational Stress  Questionnaire    Feeling of Stress : Very much  Social Connections: Unknown (03/13/2023)   Received from [place], [facility]   Social Connections    How often do you feel lonely or isolated from those around you? (Adult - for ages 88 years and over): Not on file  Intimate Partner Violence: Not At Risk (03/30/2023)   Humiliation, Afraid, Rape, and Kick questionnaire    Fear of Current or Ex-Partner: No    Emotionally Abused: No    Physically Abused: No    Sexually Abused: No     Outpatient Medications Prior to Visit  Medication Sig Dispense Refill   amitriptyline (ELAVIL) 25 MG tablet Take 25 mg by mouth at bedtime.     HYDROmorphone (DILAUDID) 2 MG tablet Take 2 mg by mouth every 6 (six) hours as needed.     levothyroxine  (SYNTHROID) 125 MCG tablet Take 1 tablet (125 mcg total) by mouth daily. 30 tablet 11   LORazepam (ATIVAN) 0.5 MG tablet Take 0.5 mg by mouth 2 (two) times daily.     meclizine (ANTIVERT) 25 MG tablet Take 0.5 tablets (12.5 mg total) by mouth every 8 (eight) hours as needed. 15 tablet 0   melatonin 3 MG TABS tablet Take 3 mg by mouth at bedtime as needed.     metoCLOPramide (REGLAN) 10 MG tablet Take 1 tablet (10 mg total) by mouth every 6 (six) hours as needed for nausea. 30 tablet 1   ondansetron (ZOFRAN-ODT) 4 MG disintegrating tablet Take 1 tablet (4 mg total) by mouth every 8 (eight) hours as needed. 30 tablet 0   pantoprazole (PROTONIX) 40 MG tablet Take 1 tablet (40 mg total) by mouth daily. (Patient taking differently: Take 40 mg by mouth 2 (two) times daily.) 30 tablet 0   prochlorperazine (COMPAZINE) 25 MG suppository Place 1 suppository (25 mg total) rectally every 12 (twelve) hours as needed for nausea 12 suppository 0   promethazine (PHENERGAN) 25 MG tablet Take 1 tablet (25 mg total) by mouth every 8 (eight) hours as needed for nausea or vomiting. 10 tablet 0   bisacodyl (DULCOLAX) 5 MG EC tablet Take 1 tablet (5 mg total) by mouth daily as needed for moderate constipation. 100 tablet 0   butalbital-acetaminophen-caffeine (FIORICET) 50-325-40 MG tablet Take 1 tablet by mouth every 6 (six) hours as needed for headache. 12 tablet 0   naloxone (NARCAN) nasal spray 4 mg/0.1 mL Place 0.4 mg into the nose once.     polyethylene glycol (MIRALAX / GLYCOLAX) 17 g packet Take 17 grams (mixed in 8 oz liquid) by mouth daily. 14 each 0   tirzepatide (ZEPBOUND) 2.5 MG/0.5ML Pen Inject 2.5 mg into the skin once a week.     No facility-administered medications prior to visit.    Allergies  Allergen Reactions   Aspirin Swelling and Anaphylaxis    Tongue swelling Other reaction(s): Edema face/lips/tongue Facial swelling    Morphine Anaphylaxis and Swelling    ROS Review of Systems Negative  unless indicated in HPI.    Objective:    Physical Exam  BP 120/80   Pulse (!) 105   Temp 98.3 F (36.8 C) (Oral)   Ht 5\' 9"  (1.753 m)   Wt 224 lb 6.4 oz (101.8 kg)   SpO2 98%   BMI 33.14 kg/m  Wt Readings from Last 3 Encounters:  05/18/23 224 lb 6.4 oz (101.8 kg)  04/10/23 225 lb 12 oz (102.4 kg)  04/10/23 225 lb 12.8 oz (102.4 kg)  Health Maintenance  Topic Date Due   Hepatitis C Screening  Never done   PAP SMEAR-Modifier  Never done   COVID-19 Vaccine (1 - 2023-24 season) Never done   INFLUENZA VACCINE  04/26/2023   DTaP/Tdap/Td (2 - Td or Tdap) 12/31/2023   HIV Screening  Completed   HPV VACCINES  Aged Out    There are no preventive care reminders to display for this patient.  Lab Results  Component Value Date   TSH 41.514 (H) 04/01/2023   Lab Results  Component Value Date   WBC 7.2 04/10/2023   HGB 12.2 04/10/2023   HCT 36.8 04/10/2023   MCV 94.1 04/10/2023   PLT 293 04/10/2023   Lab Results  Component Value Date   NA 138 04/10/2023   K 3.7 04/10/2023   CO2 23 04/10/2023   GLUCOSE 88 04/10/2023   BUN 10 04/10/2023   CREATININE 0.81 04/10/2023   BILITOT 0.6 04/10/2023   ALKPHOS 95 04/10/2023   AST 16 04/10/2023   ALT 24 04/10/2023   PROT 6.9 04/10/2023   ALBUMIN 3.7 04/10/2023   CALCIUM 7.3 (L) 04/10/2023   ANIONGAP 8 04/10/2023   GFR 99.17 02/07/2023   Lab Results  Component Value Date   CHOL 171 02/07/2023   Lab Results  Component Value Date   HDL 65.30 02/07/2023   Lab Results  Component Value Date   LDLCALC 88 02/07/2023   Lab Results  Component Value Date   TRIG 88.0 02/07/2023   Lab Results  Component Value Date   CHOLHDL 3 02/07/2023   No results found for: "HGBA1C"    Assessment & Plan:  There are no diagnoses linked to this encounter.  Follow-up: No follow-ups on file.   [name], NP

## 2023-05-20 ENCOUNTER — Encounter: Payer: Self-pay | Admitting: Nurse Practitioner

## 2023-05-20 DIAGNOSIS — R600 Localized edema: Secondary | ICD-10-CM | POA: Insufficient documentation

## 2023-05-20 NOTE — Assessment & Plan Note (Signed)
Unknown etiology.  Continue antinausea medication. Referral sent to GI

## 2023-05-20 NOTE — Assessment & Plan Note (Signed)
Will treat with Omnicef. Advised patient to take the medication with food and take probiotics or yogurt. Advised to alternate warm and cold compress and elevate the arm.

## 2023-05-20 NOTE — Assessment & Plan Note (Signed)
Oxycodone refilled. Referral sent to neuro-ophthalmology and pain management

## 2023-05-21 ENCOUNTER — Other Ambulatory Visit: Payer: Self-pay

## 2023-05-21 NOTE — Telephone Encounter (Signed)
Last filled by Viviano Simas, NP on 03/29/2023 Last OV: 05/18/2023 Next OV: 06/01/2023

## 2023-05-23 ENCOUNTER — Other Ambulatory Visit: Payer: Self-pay | Admitting: Nurse Practitioner

## 2023-05-23 ENCOUNTER — Other Ambulatory Visit: Payer: Self-pay

## 2023-05-23 MED ORDER — PROCHLORPERAZINE 25 MG RE SUPP
25.0000 mg | Freq: Two times a day (BID) | RECTAL | 0 refills | Status: DC | PRN
Start: 2023-05-23 — End: 2023-06-16
  Filled 2023-05-23: qty 12, 6d supply, fill #0

## 2023-05-23 NOTE — Telephone Encounter (Signed)
Medication sent.

## 2023-05-24 ENCOUNTER — Encounter: Payer: Self-pay | Admitting: Nurse Practitioner

## 2023-05-24 ENCOUNTER — Telehealth: Payer: Self-pay

## 2023-05-24 ENCOUNTER — Other Ambulatory Visit: Payer: Self-pay

## 2023-05-24 MED ORDER — ONDANSETRON 4 MG PO TBDP: 4.0000 mg | ORAL_TABLET | Freq: Three times a day (TID) | ORAL | 0 refills | Status: DC | PRN

## 2023-05-24 MED ORDER — ONDANSETRON HCL 4 MG PO TABS: 4.0000 mg | ORAL_TABLET | Freq: Three times a day (TID) | ORAL | 0 refills | Status: DC | PRN

## 2023-05-24 MED ORDER — OXYCODONE-ACETAMINOPHEN 10-325 MG PO TABS: 1.0000 | ORAL_TABLET | ORAL | 0 refills | Status: AC | PRN

## 2023-05-24 NOTE — Telephone Encounter (Signed)
Please inform pt the medications has been sent to [facility] pharmacy.

## 2023-05-24 NOTE — Telephone Encounter (Signed)
Can you please check the status for her referrals?

## 2023-05-24 NOTE — Telephone Encounter (Signed)
Please inform pt the referral has been authorized for [facility] and she can call to schedule the appointment. The referral for the neuro Optho and pain management has been authorized too.

## 2023-05-24 NOTE — Telephone Encounter (Signed)
Patient was in the hospital at [facility] and was discharged on 05/14/2023.  Hannah Ballard has seen her for a hospital follow-up visit and she refilled her medications with a 5-day supply because she should be in with a specialist by then.  Patient states [name], NP, referred her to a GI to take over the feeding tube and Pain Mgt.  Patient states she has not heard from either specialist.  Patient states her feeding tube has moved and she threaded it back in.  Patient states she is freaking out because this is a holiday weekend coming up.  Patient states she needs nausea and pain medication.  Patient states [facility] will no longer accept her insurance so she cannot see the Neuro-Ophthalmologist (patient states she had an ulcer in her eye, which is how all this started, then her eye got infected, pinched nerve is causing pain and she can't keep anything down)  Patient states she would like for [place] to please call her.  Prescription Request  05/24/2023  LOV: Visit date not found  What is the name of the medication or equipment? ondansetron (ZOFRAN-ODT) 4 MG disintegrating tablet and Oxycodone 10 MG every 4 hours.  Have you contacted your pharmacy to request a refill? No   Which pharmacy would you like this sent to?  [facility] [address] [place] - [name] [place] [address] Phone: [phone] Fax: [phone]    Patient notified that their request is being sent to the clinical staff for review and that they should receive a response within 2 business days.   Please advise at Mobile [phone] (mobile)  Patient states she will be out of this medication tomorrow.

## 2023-05-29 ENCOUNTER — Other Ambulatory Visit: Payer: Self-pay | Admitting: Nurse Practitioner

## 2023-05-29 NOTE — Telephone Encounter (Signed)
[facility] called stating the pt is not in network with [facility]

## 2023-05-30 ENCOUNTER — Telehealth: Payer: Self-pay | Admitting: Nurse Practitioner

## 2023-05-30 NOTE — Telephone Encounter (Signed)
Patient was referred to [facility], they are refusing to see her because she was not seen there. Patient was seen at [facility], which does not take her insurance.   Also she needs refills on both of her pain medications. Her pharmacy is [facility] in [place], [address]. Patient only has 2 pills remaining.

## 2023-05-31 ENCOUNTER — Telehealth: Payer: Self-pay | Admitting: Nurse Practitioner

## 2023-05-31 ENCOUNTER — Ambulatory Visit (INDEPENDENT_AMBULATORY_CARE_PROVIDER_SITE_OTHER): Payer: Medicaid Other | Admitting: Nurse Practitioner

## 2023-05-31 ENCOUNTER — Encounter: Payer: Self-pay | Admitting: Nurse Practitioner

## 2023-05-31 ENCOUNTER — Other Ambulatory Visit: Payer: Self-pay

## 2023-05-31 VITALS — BP 126/84 | HR 98 | Temp 97.4°F | Ht 69.0 in | Wt 225.8 lb

## 2023-05-31 DIAGNOSIS — H5711 Ocular pain, right eye: Secondary | ICD-10-CM | POA: Diagnosis not present

## 2023-05-31 MED ORDER — OXYCODONE-ACETAMINOPHEN 10-325 MG PO TABS
1.0000 | ORAL_TABLET | ORAL | 0 refills | Status: DC | PRN
Start: 2023-05-31 — End: 2023-06-05

## 2023-05-31 MED ORDER — ONDANSETRON 4 MG PO TBDP: 4.0000 mg | ORAL_TABLET | Freq: Three times a day (TID) | ORAL | 1 refills | Status: DC | PRN

## 2023-05-31 NOTE — Telephone Encounter (Signed)
Noted! Thank you

## 2023-05-31 NOTE — Telephone Encounter (Signed)
Thank you :)

## 2023-05-31 NOTE — Telephone Encounter (Signed)
Patient still needs referral to [facility] for pain management unless PCP is going to manage.

## 2023-05-31 NOTE — Progress Notes (Signed)
Established Patient Office Visit  Subjective:  Patient ID: Hannah Ballard, female    DOB: [birth year]  Age: 42 y.o. MRN: [id number]  CC:  Chief Complaint  Patient presents with   Acute Visit    Discuss Pain Management      HPI  Hannah Ballard presents for pain in the eye.   Eye Pain  The right eye is affected. This is a recurrent problem. The current episode started more than 1 month ago. The problem occurs constantly. Injury mechanism: Corneal ulcer. The pain is at a severity of 3/10 (with medication). The pain is severe. She Does not wear contacts. Associated symptoms include blurred vision, nausea and vomiting. Pertinent negatives include no double vision, fever or foreign body sensation.    She states she has been taking Zofran 8 mg and compazine  every 3-4 hours.  She is able to get keep the food in if she is taking antinausea. She has normal BM daily with miralax and stool softner.  Denises abdominal pain.   She has [place] tube placement  at [facility] 3 weeks ago.  She was referred to [facility].  The patient reports that she referral from [facility] in order to be seen at [facility].  Management milligrams because for  Past Medical History:  Diagnosis Date   Cancer (HCC)    Hypertension    Migraines    Thyroid disease     Past Surgical History:  Procedure Laterality Date   ABDOMINAL HYSTERECTOMY  2018   ANKLE SURGERY Left    has hardware   ANTERIOR FUSION CERVICAL SPINE  2008   APPENDECTOMY  1997   BIOPSY  04/07/2023   Procedure: BIOPSY;  Surgeon: [name], MD;  Location: [facility];  Service: Gastroenterology;;   BREAST BIOPSY     BREAST BIOPSY Right 03/12/2023   MM RT BREAST BX W LOC DEV 1ST LESION IMAGE BX SPEC STEREO GUIDE 03/12/2023 [facility]   CHOLECYSTECTOMY     ESOPHAGOGASTRODUODENOSCOPY (EGD) WITH PROPOFOL N/A 04/07/2023   Procedure: ESOPHAGOGASTRODUODENOSCOPY (EGD) WITH PROPOFOL;  Surgeon: [name], MD;  Location: [facility];  Service:  Gastroenterology;  Laterality: N/A;   FRACTURE SURGERY     SHOULDER SURGERY Left    has pins   SPINE SURGERY      Family History  Problem Relation Age of Onset   Healthy Mother    Healthy Father     Social History   Socioeconomic History   Marital status: Married    Spouse name: Not on file   Number of children: Not on file   Years of education: Not on file   Highest education level: Associate degree: occupational, Scientist, product/process development, or vocational program  Occupational History   Not on file  Tobacco Use   Smoking status: Former    Current packs/day: 0.00    Types: Cigarettes    Quit date: 2019    Years since quitting: 5.7   Smokeless tobacco: Never  Vaping Use   Vaping status: Never Used  Substance and Sexual Activity   Alcohol use: Never   Drug use: Never   Sexual activity: Yes  Other Topics Concern   Not on file  Social History Narrative   Not on file   Social Determinants of Health   Financial Resource Strain: High Risk (05/07/2023)   Received from [facility]   Overall Financial Resource Strain (CARDIA)    Difficulty of Paying Living Expenses: Very hard  Food Insecurity: Food Insecurity Present (05/07/2023)  Received from [facility]   Hunger Vital Sign    Worried About Running Out of Food in the Last Year: Sometimes true    Ran Out of Food in the Last Year: Sometimes true  Transportation Needs: Unmet Transportation Needs (05/07/2023)   Received from [facility] - Transportation    In the past 12 months, has lack of transportation kept you from medical appointments or from getting medications?: Yes    Lack of Transportation (Non-Medical): Yes  Physical Activity: Sufficiently Active (02/07/2023)   Exercise Vital Sign    Days of Exercise per Week: 5 days    Minutes of Exercise per Session: 60 min  Stress: Stress Concern Present (02/07/2023)   [employer] of Occupational [facility] - Occupational  Stress Questionnaire    Feeling of Stress : Very much  Social Connections: Unknown (03/13/2023)   Received from [place], [facility]   Social Connections    How often do you feel lonely or isolated from those around you? (Adult - for ages 63 years and over): Not on file  Intimate Partner Violence: Not At Risk (03/30/2023)   Humiliation, Afraid, Rape, and Kick questionnaire    Fear of Current or Ex-Partner: No    Emotionally Abused: No    Physically Abused: No    Sexually Abused: No     Outpatient Medications Prior to Visit  Medication Sig Dispense Refill   amitriptyline (ELAVIL) 25 MG tablet Take 1 tablet (25 mg total) by mouth at bedtime. 90 tablet 1   cefdinir (OMNICEF) 300 MG capsule Take 1 capsule (300 mg total) by mouth 2 (two) times daily. 14 capsule 0   levothyroxine (SYNTHROID) 125 MCG tablet Take 1 tablet (125 mcg total) by mouth daily. 30 tablet 11   LORazepam (ATIVAN) 0.5 MG tablet Take 1 tablet (0.5 mg total) by mouth 2 (two) times daily. 60 tablet 0   meclizine (ANTIVERT) 25 MG tablet Take 0.5 tablets (12.5 mg total) by mouth every 8 (eight) hours as needed. 15 tablet 0   melatonin 3 MG TABS tablet Take 3 mg by mouth at bedtime as needed.     metoCLOPramide (REGLAN) 10 MG tablet Take 1 tablet (10 mg total) by mouth every 6 (six) hours as needed for nausea. 30 tablet 1   pantoprazole (PROTONIX) 40 MG tablet Take 1 tablet (40 mg total) by mouth daily. (Patient taking differently: Take 40 mg by mouth 2 (two) times daily.) 30 tablet 0   prochlorperazine (COMPAZINE) 25 MG suppository Place 1 suppository (25 mg total) rectally every 12 (twelve) hours as needed for nausea 12 suppository 0   ondansetron (ZOFRAN-ODT) 4 MG disintegrating tablet Take 1 tablet (4 mg total) by mouth every 8 (eight) hours as needed. 30 tablet 0   oxyCODONE (OXY IR/ROXICODONE) 5 MG immediate release tablet Take 5 mg by mouth every 4 (four) hours as needed.     bisacodyl (DULCOLAX) 5 MG EC tablet Take 1 tablet  (5 mg total) by mouth daily as needed for moderate constipation. 100 tablet 0   butalbital-acetaminophen-caffeine (FIORICET) 50-325-40 MG tablet Take 1 tablet by mouth every 6 (six) hours as needed for headache. 12 tablet 0   naloxone (NARCAN) nasal spray 4 mg/0.1 mL Place 0.4 mg into the nose once.     polyethylene glycol (MIRALAX / GLYCOLAX) 17 g packet Take 17 grams (mixed in 8 oz liquid) by mouth daily. 14 each 0   promethazine (PHENERGAN) 25 MG tablet Take 1  tablet (25 mg total) by mouth every 8 (eight) hours as needed for nausea or vomiting. 10 tablet 0   tirzepatide (ZEPBOUND) 2.5 MG/0.5ML Pen Inject 2.5 mg into the skin once a week.     No facility-administered medications prior to visit.    Allergies  Allergen Reactions   Aspirin Swelling and Anaphylaxis    Tongue swelling Other reaction(s): Edema face/lips/tongue Facial swelling    Morphine Anaphylaxis and Swelling    ROS Review of Systems  Constitutional:  Negative for fever.  Eyes:  Positive for blurred vision and pain. Negative for double vision.  Gastrointestinal:  Positive for nausea and vomiting.   Negative unless indicated in HPI.    Objective:    Physical Exam Constitutional:      Appearance: Normal appearance.  HENT:     Mouth/Throat:     Mouth: Mucous membranes are moist.  Eyes:     Conjunctiva/sclera: Conjunctivae normal.     Pupils: Pupils are equal, round, and reactive to light.  Cardiovascular:     Rate and Rhythm: Normal rate and regular rhythm.     Pulses: Normal pulses.     Heart sounds: Normal heart sounds.  Pulmonary:     Effort: Pulmonary effort is normal.     Breath sounds: Normal breath sounds.  Abdominal:     General: Bowel sounds are normal.     Palpations: Abdomen is soft.  Musculoskeletal:     Cervical back: Normal range of motion.  Skin:    General: Skin is warm.  Neurological:     General: No focal deficit present.     Mental Status: She is alert and oriented to person,  place, and time. Mental status is at baseline.  Psychiatric:        Mood and Affect: Mood normal.        Behavior: Behavior normal.        Thought Content: Thought content normal.        Judgment: Judgment normal.     BP 126/84   Pulse 98   Temp (!) 97.4 F (36.3 C)   Ht 5\' 9"  (1.753 m)   Wt 225 lb 12.8 oz (102.4 kg)   SpO2 99%   BMI 33.34 kg/m  Wt Readings from Last 3 Encounters:  06/04/23 224 lb 13.9 oz (102 kg)  05/31/23 225 lb 12.8 oz (102.4 kg)  05/18/23 224 lb 6.4 oz (101.8 kg)     Health Maintenance  Topic Date Due   Hepatitis C Screening  Never done   COVID-19 Vaccine (1 - 2023-24 season) 06/16/2023 (Originally 05/27/2023)   INFLUENZA VACCINE  12/24/2023 (Originally 04/26/2023)   DTaP/Tdap/Td (2 - Td or Tdap) 12/31/2023   HIV Screening  Completed   HPV VACCINES  Aged Out    There are no preventive care reminders to display for this patient.  Lab Results  Component Value Date   TSH 41.514 (H) 04/01/2023   Lab Results  Component Value Date   WBC 7.2 04/10/2023   HGB 12.2 04/10/2023   HCT 36.8 04/10/2023   MCV 94.1 04/10/2023   PLT 293 04/10/2023   Lab Results  Component Value Date   NA 138 04/10/2023   K 3.7 04/10/2023   CO2 23 04/10/2023   GLUCOSE 88 04/10/2023   BUN 10 04/10/2023   CREATININE 0.81 04/10/2023   BILITOT 0.6 04/10/2023   ALKPHOS 95 04/10/2023   AST 16 04/10/2023   ALT 24 04/10/2023   PROT 6.9 04/10/2023  ALBUMIN 3.7 04/10/2023   CALCIUM 7.3 (L) 04/10/2023   ANIONGAP 8 04/10/2023   GFR 99.17 02/07/2023   Lab Results  Component Value Date   CHOL 171 02/07/2023   Lab Results  Component Value Date   HDL 65.30 02/07/2023   Lab Results  Component Value Date   LDLCALC 88 02/07/2023   Lab Results  Component Value Date   TRIG 88.0 02/07/2023   Lab Results  Component Value Date   CHOLHDL 3 02/07/2023   No results found for: "HGBA1C"    Assessment & Plan:  Pain of right eye Assessment & Plan: Patient had right eye  pain after corneal ulcer.  She has been referred to pain management and neuro-ophthalmology. Pain contract signed until patient is scheduled with pain management.     Follow-up: Return if symptoms worsen or fail to improve.   [name], NP

## 2023-06-01 ENCOUNTER — Ambulatory Visit: Payer: Medicaid Other | Admitting: Nurse Practitioner

## 2023-06-04 ENCOUNTER — Emergency Department: Payer: Medicaid Other

## 2023-06-04 ENCOUNTER — Other Ambulatory Visit: Payer: Self-pay

## 2023-06-04 ENCOUNTER — Emergency Department
Admission: EM | Admit: 2023-06-04 | Discharge: 2023-06-04 | Disposition: A | Discharge status: Home / Self Care | Payer: Medicaid Other | Attending: Emergency Medicine | Admitting: Emergency Medicine

## 2023-06-04 DIAGNOSIS — R112 Nausea with vomiting, unspecified: Secondary | ICD-10-CM | POA: Insufficient documentation

## 2023-06-04 DIAGNOSIS — H5711 Ocular pain, right eye: Secondary | ICD-10-CM | POA: Diagnosis present

## 2023-06-04 DIAGNOSIS — R519 Headache, unspecified: Secondary | ICD-10-CM | POA: Insufficient documentation

## 2023-06-04 DIAGNOSIS — Z760 Encounter for issue of repeat prescription: Secondary | ICD-10-CM | POA: Diagnosis not present

## 2023-06-04 MED ORDER — ONDANSETRON 8 MG PO TBDP: 8.0000 mg | ORAL_TABLET | Freq: Three times a day (TID) | ORAL | 0 refills | Status: AC | PRN

## 2023-06-04 MED ORDER — OXYCODONE-ACETAMINOPHEN 5-325 MG PO TABS
1.0000 | ORAL_TABLET | Freq: Once | ORAL | Status: AC
  Administered 2023-06-04: 1 via ORAL
  Filled 2023-06-04: qty 1

## 2023-06-04 MED ORDER — OXYCODONE HCL 5 MG PO TABS
5.0000 mg | ORAL_TABLET | Freq: Once | ORAL | Status: AC
  Administered 2023-06-04: 5 mg via ORAL
  Filled 2023-06-04: qty 1

## 2023-06-04 MED ORDER — OXYCODONE-ACETAMINOPHEN 10-325 MG PO TABS: 1.0000 | ORAL_TABLET | Freq: Four times a day (QID) | ORAL | 0 refills | Status: AC | PRN

## 2023-06-04 MED ORDER — ONDANSETRON 8 MG PO TBDP
8.0000 mg | ORAL_TABLET | Freq: Once | ORAL | Status: AC
  Administered 2023-06-04: 8 mg via ORAL
  Filled 2023-06-04: qty 1

## 2023-06-04 NOTE — Discharge Instructions (Addendum)
Take the prescription meds as directed.  Follow-up with your primary provider for ongoing medication management.

## 2023-06-04 NOTE — ED Notes (Signed)
See triage note. Per pt, eye ulcer has healed but she still has eye pain and vision is still slightly impaired. Right eye pain is 7/10. Nausea is constant and pt reports 4 episodes of vomiting today. Last dose of prescribed pain medication taken at 0900 this morning.

## 2023-06-04 NOTE — ED Triage Notes (Signed)
Pt presents via c/o right eye pain. Reports has a right eye ulcer. Recently admitted at [facility] and [place] [facility]. Nasogastric tube in place for feedings. Reports ran out of nausea medication and pain medication due to an insurance issue. Reports emesis x4 today. Last does of antiemetics x2 hours PTA. Requesting to have position of NG tube checked.

## 2023-06-04 NOTE — Telephone Encounter (Signed)
Hannah Ballard called from [facility] on [address] to state they received a prescription for oxyCODONE-acetaminophen (PERCOCET) 10-325 MG tablet - 60 tablets.  Hannah Ballard states patient's insurance will only fill prescription for 30 tablets instead of 60.  Hannah Ballard states she would like to know if we can send another prescription for the remaining 30 tablets.  Hannah Ballard states patient is very concerned about the possibility of running out of this medication.

## 2023-06-04 NOTE — ED Provider Notes (Signed)
[facility] Emergency Department Provider Note     Event Date/Time   First MD Initiated Contact with Patient 06/04/23 2034     (approximate)   History   Emesis and Eye Pain   HPI  Hannah Ballard is a 42 y.o. female with an extensive history of acute right eye pain as well as headache and intractable nausea who returns to the ED after being evaluated by [facility] in the interim.  Patient apparently had a nasoenteric tube placed for eventual nausea and vomiting by [facility] medical [facility].  She is also been followed by her primary provider with ongoing prescriptions managing her pain with Percocet 10/325.  According to the patient and chart review, there was some discrepancy with the insurance, only allowing 30 tablets to be prescribed at a time.  Patient presents today after reaching out to her PCP noting that she was going to run out of medication, with evidence that the PCP did not make contact with the patient prior to the office closing.  She presents today requesting refill of her Percocet as well as her antiemetic.  She is also asking that the position of NE to be checked, noting some slippage in the shower last week.  Patient is to be followed by a neuro-ophthalmologist for ongoing management of her eye pain and a referral has been made to pain management for ongoing management of her now chronic pain.   Physical Exam   Triage Vital Signs: ED Triage Vitals  Encounter Vitals Group     BP 06/04/23 1950 (!) 150/134     Systolic BP Percentile --      Diastolic BP Percentile --      Pulse Rate 06/04/23 1950 (!) 105     Resp 06/04/23 1950 17     Temp 06/04/23 1950 99 F (37.2 C)     Temp Source 06/04/23 1950 Oral     SpO2 06/04/23 1950 100 %     Weight 06/04/23 1953 224 lb 13.9 oz (102 kg)     Height 06/04/23 1953 5\' 9"  (1.753 m)     Head Circumference --      Peak Flow --      Pain Score 06/04/23 1953 7     Pain Loc --      Pain Education --       Exclude from Growth Chart --     Most recent vital signs: Vitals:   06/04/23 1950  BP: (!) 150/134  Pulse: (!) 105  Resp: 17  Temp: 99 F (37.2 C)  SpO2: 100%    General Awake, no distress. NAD HEENT NCAT. PERRL. EOMI. No rhinorrhea. Mucous membranes are moist. Nasal-enteric tube in place in the right nare.  CV:  Good peripheral perfusion.  RESP:  Normal effort.  ABD:  No distention.     ED Results / Procedures / Treatments   Labs (all labs ordered are listed, but only abnormal results are displayed) Labs Reviewed - No data to display   EKG   RADIOLOGY No results found.   PROCEDURES:  Critical Care performed: No  Procedures   MEDICATIONS ORDERED IN ED: Medications  oxyCODONE-acetaminophen (PERCOCET/ROXICET) 5-325 MG per tablet 1 tablet (has no administration in time range)  oxyCODONE (Oxy IR/ROXICODONE) immediate release tablet 5 mg (has no administration in time range)  ondansetron (ZOFRAN-ODT) disintegrating tablet 8 mg (has no administration in time range)     IMPRESSION / MDM / ASSESSMENT AND PLAN /  ED COURSE  I reviewed the triage vital signs and the nursing notes.                              Differential diagnosis includes, but is not limited to, medicine refill, tractable pain, chronic pain  Patient's presentation is most consistent with acute complicated illness / injury requiring diagnostic workup.  Patient's diagnosis is consistent with chronic pain management. Patient will be discharged home with prescriptions for Percocet 10/325 (#12) Zofran 8 mg ODT (#30). Patient is to follow up with her PCP or specialist as scheduled, as needed or otherwise directed. Patient is given ED precautions to return to the ED for any worsening or new symptoms    FINAL CLINICAL IMPRESSION(S) / ED DIAGNOSES   Final diagnoses:  Encounter for medication refill     Rx / DC Orders   ED Discharge Orders          Ordered    oxyCODONE-acetaminophen (PERCOCET)  10-325 MG tablet  Every 6 hours PRN        06/04/23 2124    ondansetron (ZOFRAN-ODT) 8 MG disintegrating tablet  Every 8 hours PRN        06/04/23 2124             Note:  This document was prepared using Dragon voice recognition software and may include unintentional dictation errors.    Lissa Hoard, PA-C 06/05/23 0009    Corena Herter, MD 06/06/23 0008

## 2023-06-05 ENCOUNTER — Other Ambulatory Visit: Payer: Self-pay | Admitting: Nurse Practitioner

## 2023-06-05 MED ORDER — OXYCODONE-ACETAMINOPHEN 10-325 MG PO TABS: 1.0000 | ORAL_TABLET | ORAL | 0 refills | Status: AC | PRN

## 2023-06-05 NOTE — Telephone Encounter (Signed)
Noted  

## 2023-06-05 NOTE — Telephone Encounter (Signed)
Patient just called today asking about her medication refills. I told her that they have been refilled.

## 2023-06-05 NOTE — Telephone Encounter (Signed)
Her medication has been refilled. Please check with the regarding the appointment with the pain management?

## 2023-06-05 NOTE — Telephone Encounter (Signed)
Patient ended up going to the ED last night. She wants to know if she should pick up the medication that the ED prescribed or is Hannah Ballard going to refill her oxycodone?

## 2023-06-06 ENCOUNTER — Telehealth: Payer: Self-pay

## 2023-06-06 NOTE — Telephone Encounter (Signed)
Patient return the call trying to see if she will be able to get seen or not.

## 2023-06-06 NOTE — Telephone Encounter (Signed)
Patient can be seen patient only had a EGD done as inpatient and never saw a GI doctor out patient.

## 2023-06-06 NOTE — Telephone Encounter (Signed)
Patient also does not have transportation she states to drive to [place] to the doctor because she lives in [place]

## 2023-06-07 ENCOUNTER — Telehealth: Payer: Self-pay

## 2023-06-07 NOTE — Transitions of Care (Post Inpatient/ED Visit) (Signed)
Unable to reach patient by phone and left v/m requesting call back at [phone].       06/07/2023  Name: Lamanda Wahlman MRN: [id number] DOB: 07-May-1981  Today's TOC FU Call Status: Today's TOC FU Call Status:: Unsuccessful Call (1st Attempt) Unsuccessful Call (1st Attempt) Date: 06/07/23  Attempted to reach the patient regarding the most recent Inpatient/ED visit.  Follow Up Plan: Additional outreach attempts will be made to reach the patient to complete the Transitions of Care (Post Inpatient/ED visit) call.   Signature Lewanda Rife, LPN

## 2023-06-08 NOTE — Transitions of Care (Post Inpatient/ED Visit) (Signed)
I spoke with pt but she has no time toi talk because she is getting ready for work. Unsure if pt will cb or will need to do FU call.       06/08/2023  Name: Hannah Ballard MRN: [id number] DOB: 1980/10/05  Today's TOC FU Call Status: Today's TOC FU Call Status:: Unsuccessful Call (2nd Attempt) Unsuccessful Call (1st Attempt) Date: 06/07/23 Unsuccessful Call (2nd Attempt) Date: 06/08/23 (I spoke with pt but she has  no time toi talk because she is getting ready for work.)  Attempted to reach the patient regarding the most recent Inpatient/ED visit.  Follow Up Plan: Additional outreach attempts will be made to reach the patient to complete the Transitions of Care (Post Inpatient/ED visit) call.   Signature Lewanda Rife, LPN

## 2023-06-11 DIAGNOSIS — H5711 Ocular pain, right eye: Secondary | ICD-10-CM | POA: Insufficient documentation

## 2023-06-11 NOTE — Telephone Encounter (Signed)
Patient called again about her oxycodone medication. She states she does not see Pain Management until the end of Month, Evelene Croon did not prescribe a 30 day for her.

## 2023-06-11 NOTE — Assessment & Plan Note (Signed)
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  She has been referred to pain management and neuro-ophthalmology. Pain contract signed until patient is scheduled with pain management.

## 2023-06-11 NOTE — Telephone Encounter (Signed)
Pt requesting month supply sent to pharmacy

## 2023-06-12 MED ORDER — OXYCODONE-ACETAMINOPHEN 10-325 MG PO TABS: 1.0000 | ORAL_TABLET | Freq: Four times a day (QID) | ORAL | 0 refills | Status: DC | PRN

## 2023-06-12 NOTE — Telephone Encounter (Signed)
Lvm for pt to give office a call back

## 2023-06-12 NOTE — Telephone Encounter (Signed)
Please call the patient to confirm if she have received the appointment for pain management, neuro ophthalmology and GI.

## 2023-06-12 NOTE — Telephone Encounter (Signed)
The medication has been sent but need to take every 6 hours and can take tylenol in between for pain.

## 2023-06-12 NOTE — Telephone Encounter (Signed)
Pt is aware and gave a verbal understanding.

## 2023-06-13 ENCOUNTER — Other Ambulatory Visit (HOSPITAL_COMMUNITY): Payer: Self-pay

## 2023-06-15 ENCOUNTER — Encounter: Payer: Self-pay | Admitting: Nurse Practitioner

## 2023-06-15 ENCOUNTER — Telehealth: Payer: Self-pay | Admitting: Nurse Practitioner

## 2023-06-15 ENCOUNTER — Telehealth (INDEPENDENT_AMBULATORY_CARE_PROVIDER_SITE_OTHER): Payer: Medicaid Other | Admitting: Nurse Practitioner

## 2023-06-15 VITALS — Ht 69.0 in | Wt 224.0 lb

## 2023-06-15 DIAGNOSIS — Z79899 Other long term (current) drug therapy: Secondary | ICD-10-CM

## 2023-06-15 DIAGNOSIS — H5711 Ocular pain, right eye: Secondary | ICD-10-CM | POA: Diagnosis not present

## 2023-06-15 DIAGNOSIS — E039 Hypothyroidism, unspecified: Secondary | ICD-10-CM

## 2023-06-15 NOTE — Telephone Encounter (Signed)
[facility] called stating pt only has twenty pills of oxy because her insurance is only allowing her to have that amount. [facility] stated its not enough for thirty days, the pt has to go up on the dose

## 2023-06-15 NOTE — Telephone Encounter (Signed)
Pt has Virtual visit with Evelene Croon today at 4pm

## 2023-06-15 NOTE — Progress Notes (Signed)
Virtual Visit via Video Note  I connected with Secret Kristensen on 1/61/0960 at 4:54 PM by a video enabled telemedicine application and verified that I am speaking with the correct person using two identifiers.  Patient Location: Other:  Work Dispensing optician: Office/Clinic  I discussed the limitations, risks, security, and privacy concerns of performing an evaluation and management service by video and the availability of in person appointments. I also discussed with the patient that there may be a patient responsible charge related to this service. The patient expressed understanding and agreed to proceed.  Subjective: PCP: Hannah Dies, NP  Chief Complaint  Patient presents with   Medical Management of Chronic Issues    Discuss medication   HPI  Patient is seen for refill of pain medication.  Patient states that she has been right eye pain and is scheduled to see pain management on 27th September.  She states that her pain is controlled with taking the medication around-the-clock.  She has been able to better.  Patient states that her nausea is stable on antiemetics. ROS: Per HPI  Current Outpatient Medications:    amitriptyline (ELAVIL) 25 MG tablet, Take 1 tablet (25 mg total) by mouth at bedtime., Disp: 90 tablet, Rfl: 1   Cholecalciferol 1.25 MG (50000 UT) capsule, Take 1 capsule by mouth once a week., Disp: , Rfl:    metoCLOPramide (REGLAN) 5 MG tablet, Take 5 mg by mouth 3 (three) times daily., Disp: , Rfl:    ondansetron (ZOFRAN-ODT) 8 MG disintegrating tablet, Take 1 tablet (8 mg total) by mouth every 8 (eight) hours as needed for nausea or vomiting., Disp: 20 tablet, Rfl: 0   promethazine (PHENERGAN) 12.5 MG suppository, Place 12.5 mg rectally every 6 (six) hours as needed., Disp: , Rfl:    levothyroxine (SYNTHROID) 150 MCG tablet, Take 1 tablet (150 mcg total) by mouth daily before breakfast., Disp: 30 tablet, Rfl: 11   meclizine (ANTIVERT) 25 MG tablet, Take 0.5  tablets (12.5 mg total) by mouth every 8 (eight) hours as needed. (Patient not taking: Reported on 06/15/2023), Disp: 15 tablet, Rfl: 0   oxyCODONE-acetaminophen (PERCOCET) 10-325 MG tablet, Take 1 tablet by mouth every 6 (six) hours as needed for pain., Disp: 20 tablet, Rfl: 0   pantoprazole (PROTONIX) 40 MG tablet, Take 1 tablet (40 mg total) by mouth daily., Disp: 30 tablet, Rfl: 0   prochlorperazine (COMPAZINE) 25 MG suppository, Place 1 suppository (25 mg total) rectally every 12 (twelve) hours as needed for nausea, Disp: 12 suppository, Rfl: 0  Observations/Objective: Today's Vitals   06/15/23 1618  Weight: 224 lb (101.6 kg)  Height: 5\' 9"  (1.753 m)   Physical Exam Constitutional:      General: She is not in acute distress.    Appearance: Normal appearance. She is not ill-appearing.  Eyes:     Conjunctiva/sclera: Conjunctivae normal.  Pulmonary:     Effort: No respiratory distress.  Neurological:     Mental Status: She is alert.  Psychiatric:        Mood and Affect: Mood normal.        Behavior: Behavior normal.        Thought Content: Thought content normal.        Judgment: Judgment normal.     Assessment and Plan: Eye pain, right Assessment & Plan: Stable on medication. Scheduled for pain management on 27th September. Advised patient to slowly taper the medication dose.. Will check labs for urine drug   High risk medication  use -     Drug Screen, Urine; Future  Acquired hypothyroidism Assessment & Plan: Will check TSH ,free T3 and T4.  Orders: -     TSH; Future -     T3, free; Future -     T4, free; Future    Follow Up Instructions: No follow-ups on file.   I discussed the assessment and treatment plan with the patient. The patient was provided an opportunity to ask questions, and all were answered. The patient agreed with the plan and demonstrated an understanding of the instructions.   The patient was advised to call back or seek an in-person  evaluation if the symptoms worsen or if the condition fails to improve as anticipated.  The above assessment and management plan was discussed with the patient. The patient verbalized understanding of and has agreed to the management plan.   Hannah Dies, NP

## 2023-06-16 ENCOUNTER — Other Ambulatory Visit: Payer: Self-pay | Admitting: Nurse Practitioner

## 2023-06-16 DIAGNOSIS — R110 Nausea: Secondary | ICD-10-CM

## 2023-06-16 MED ORDER — OXYCODONE-ACETAMINOPHEN 10-325 MG PO TABS: 1.0000 | ORAL_TABLET | Freq: Four times a day (QID) | ORAL | 0 refills | Status: DC | PRN

## 2023-06-16 MED ORDER — PROCHLORPERAZINE 25 MG RE SUPP: 25.0000 mg | Freq: Two times a day (BID) | RECTAL | 0 refills | Status: AC | PRN

## 2023-06-19 ENCOUNTER — Telehealth: Payer: Self-pay | Admitting: Nurse Practitioner

## 2023-06-19 DIAGNOSIS — E039 Hypothyroidism, unspecified: Secondary | ICD-10-CM

## 2023-06-19 DIAGNOSIS — H5711 Ocular pain, right eye: Secondary | ICD-10-CM

## 2023-06-20 ENCOUNTER — Other Ambulatory Visit: Payer: Self-pay | Admitting: Nurse Practitioner

## 2023-06-20 ENCOUNTER — Other Ambulatory Visit (HOSPITAL_COMMUNITY): Payer: Self-pay

## 2023-06-20 MED ORDER — PANTOPRAZOLE SODIUM 40 MG PO TBEC
40.0000 mg | DELAYED_RELEASE_TABLET | Freq: Every day | ORAL | 0 refills | Status: AC
  Filled 2023-06-20 – 2023-12-09 (×2): qty 30, 30d supply, fill #0

## 2023-06-20 MED ORDER — LEVOTHYROXINE SODIUM 150 MCG PO TABS: 150.0000 ug | ORAL_TABLET | Freq: Every day | ORAL | 11 refills | Status: AC

## 2023-06-20 MED ORDER — OXYCODONE-ACETAMINOPHEN 10-325 MG PO TABS: 1.0000 | ORAL_TABLET | Freq: Four times a day (QID) | ORAL | 0 refills | Status: DC | PRN

## 2023-06-20 NOTE — Progress Notes (Signed)
Patient has new patient appointment with pain management for right eye pain on 27 September. Will refill the prescription until patient has appointment with pain management.

## 2023-06-20 NOTE — Telephone Encounter (Signed)
Spoke with pt. Pt stated she was unable to fill the oxycodone due to pharmacy not having enough. Pt stated she would like the [facility] [address] instead.informed pt I would reach out to pcp as well as cancel the current order so a new order can be sent.  I called the pharmacy and they stated they do have the medication in stock they can only fill prescription as a 5 day supply due to her insurance, the pharmacist stated they have tried explaining this to pt. Pharmacist also stated they told pt the only was she could get a full 30 day supply s if she calls her insurance and request a letter stating that this is a chronic pain and she will be on it for a long term.

## 2023-06-20 NOTE — Telephone Encounter (Signed)
Patient just called and said she went this morning and tried to get her prescriptions from [facility] and they said they are out of both medications. She would like to see if she could get them from a different pharmacy [facility] [address] [place], [place], [place] [address]. They number is [phone]

## 2023-06-22 NOTE — Telephone Encounter (Signed)
Mailbox full unable to leave vm

## 2023-06-22 NOTE — Telephone Encounter (Signed)
Please call the pt and check regarding the appointment with pain management. She was supposed to have an appointment today. The last refill of oxycodone was on Wednesday. I can not refill it before Sunday.

## 2023-06-24 ENCOUNTER — Other Ambulatory Visit: Payer: Self-pay | Admitting: Nurse Practitioner

## 2023-06-24 DIAGNOSIS — H5711 Ocular pain, right eye: Secondary | ICD-10-CM

## 2023-06-24 MED ORDER — OXYCODONE-ACETAMINOPHEN 10-325 MG PO TABS
1.0000 | ORAL_TABLET | Freq: Four times a day (QID) | ORAL | 0 refills | Status: DC | PRN
Start: 2023-06-24 — End: 2023-07-04

## 2023-06-24 NOTE — Assessment & Plan Note (Signed)
Will check TSH ,free T3 and T4.

## 2023-06-24 NOTE — Assessment & Plan Note (Addendum)
Stable on medication. Scheduled for pain management on 27th September. Advised patient to slowly taper the medication dose.. Will check labs for urine drug

## 2023-06-25 ENCOUNTER — Other Ambulatory Visit: Payer: Self-pay

## 2023-06-25 MED ORDER — PROMETHAZINE HCL 12.5 MG RE SUPP
12.5000 mg | Freq: Four times a day (QID) | RECTAL | 0 refills | Status: AC | PRN
  Filled 2023-06-25: qty 12, 3d supply, fill #0

## 2023-06-25 NOTE — Telephone Encounter (Signed)
Please inform pt the medication has been refilled yesterday.

## 2023-06-27 ENCOUNTER — Other Ambulatory Visit: Payer: Self-pay | Admitting: Nurse Practitioner

## 2023-06-28 MED ORDER — ONDANSETRON 8 MG PO TBDP: 8.0000 mg | ORAL_TABLET | Freq: Three times a day (TID) | ORAL | 0 refills | Status: AC | PRN

## 2023-07-03 ENCOUNTER — Emergency Department: Payer: Medicaid Other

## 2023-07-03 ENCOUNTER — Encounter: Payer: Self-pay | Admitting: Emergency Medicine

## 2023-07-03 ENCOUNTER — Other Ambulatory Visit: Payer: Self-pay

## 2023-07-03 ENCOUNTER — Emergency Department
Admission: EM | Admit: 2023-07-03 | Discharge: 2023-07-04 | Disposition: A | Discharge status: Home / Self Care | Payer: Medicaid Other | Attending: Emergency Medicine | Admitting: Emergency Medicine

## 2023-07-03 DIAGNOSIS — Z4659 Encounter for fitting and adjustment of other gastrointestinal appliance and device: Secondary | ICD-10-CM | POA: Diagnosis present

## 2023-07-03 DIAGNOSIS — Z0189 Encounter for other specified special examinations: Secondary | ICD-10-CM

## 2023-07-03 MED ORDER — ALUM & MAG HYDROXIDE-SIMETH 200-200-20 MG/5ML PO SUSP
30.0000 mL | Freq: Once | ORAL | Status: AC
  Administered 2023-07-03: 30 mL via ORAL
  Filled 2023-07-03: qty 30

## 2023-07-03 MED ORDER — PROMETHAZINE HCL 25 MG/ML IJ SOLN
25.0000 mg | Freq: Once | INTRAMUSCULAR | Status: AC
  Administered 2023-07-03: 25 mg via INTRAMUSCULAR
  Filled 2023-07-03: qty 1

## 2023-07-03 MED ORDER — LIDOCAINE VISCOUS HCL 2 % MT SOLN
15.0000 mL | Freq: Once | OROMUCOSAL | Status: AC
  Administered 2023-07-03: 15 mL via OROMUCOSAL
  Filled 2023-07-03: qty 15

## 2023-07-03 NOTE — ED Triage Notes (Signed)
Pt in with problems with her feeding tube. 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 Tonight, she was in the shower and the tape came undone from her face and she felt sudden pain to abdomen as tube had been pulled out some. She tried readvancing the tube but pain continues. +n/v since, states no blood present in emesis.

## 2023-07-03 NOTE — ED Provider Notes (Signed)
[facility] Provider Note   Event Date/Time   First MD Initiated Contact with Patient 07/03/23 2203     (approximate) History  Feeding Tube problem  HPI Breean Nannini is a 42 y.o. female with a recent gastric bypass and NJ tube in place who presents after accidentally extracting this NJ tube approximately 12 cm.  Patient states that it went back and immediately however she has been having nausea, vomiting and abdominal pain since this incident.  Patient has not tried anything by mouth ROS: Patient currently denies any vision changes, tinnitus, difficulty speaking, facial droop, sore throat, chest pain, shortness of breath, diarrhea, dysuria, or weakness/numbness/paresthesias in any extremity   Physical Exam  Triage Vital Signs: ED Triage Vitals  Encounter Vitals Group     BP 07/03/23 2035 (!) 166/115     Systolic BP Percentile --      Diastolic BP Percentile --      Pulse Rate 07/03/23 2035 93     Resp 07/03/23 2035 18     Temp 07/03/23 2035 97.9 F (36.6 C)     Temp Source 07/03/23 2035 Oral     SpO2 07/03/23 2035 100 %     Weight 07/03/23 2036 215 lb (97.5 kg)     Height --      Head Circumference --      Peak Flow --      Pain Score 07/03/23 2036 5     Pain Loc --      Pain Education --      Exclude from Growth Chart --    Most recent vital signs: Vitals:   07/03/23 2035  BP: (!) 166/115  Pulse: 93  Resp: 18  Temp: 97.9 F (36.6 C)  SpO2: 100%   General: Awake, oriented x4. CV:  Good peripheral perfusion.  Resp:  Normal effort.  Abd:  No distention.  Other:  Middle-aged overweight Caucasian female resting comfortably in no acute distress.  NJ tube in place and taped to the side of face ED Results / Procedures / Treatments  Labs (all labs ordered are listed, but only abnormal results are displayed) Labs Reviewed - No data to display  RADIOLOGY ED MD interpretation: Single view abdominal x-ray shows feeding tube noted coursing through  the hemidiaphragm with tip overlying the left iliac bone likely within the jejunum -Agree with radiology assessment Official radiology report(s): DG Abdomen 1 View  Result Date: 07/03/2023 CLINICAL DATA:  feeding tube placement. History of gastric bypass surgery peer EXAM: ABDOMEN - 1 VIEW COMPARISON:  X-ray abdomen 06/04/2023 FINDINGS: Feeding tube again noted coursing below the hemidiaphragm with tip overlying the left iliac bone likely within the jejunum given history of gastric bypass surgery. The bowel gas pattern is normal. No radio-opaque calculi or other significant radiographic abnormality are seen. IMPRESSION: Feeding tube again noted coursing below the hemidiaphragm with tip overlying the left iliac bone likely within the jejunum given history of gastric bypass surgery. Electronically Signed   By: Tish Frederickson M.D.   On: 07/03/2023 21:16   PROCEDURES: Critical Care performed: No Procedures MEDICATIONS ORDERED IN ED: Medications  alum & mag hydroxide-simeth (MAALOX/MYLANTA) 200-200-20 MG/5ML suspension 30 mL (30 mLs Oral Given 07/03/23 2321)  lidocaine (XYLOCAINE) 2 % viscous mouth solution 15 mL (15 mLs Mouth/Throat Given 07/03/23 2321)  promethazine (PHENERGAN) injection 25 mg (25 mg Intramuscular Given 07/03/23 2254)   IMPRESSION / MDM / ASSESSMENT AND PLAN / ED COURSE  I reviewed the triage vital  signs and the nursing notes.                             The patient is on the cardiac monitor to evaluate for evidence of arrhythmia and/or significant heart rate changes. Patient's presentation is most consistent with acute presentation with potential threat to life or bodily function. Patient is a 42 year old female that presents after partially pulling her NG tube out.  This tube seems to be in adequate positioning at this time per single view abdominal x-ray.  Patient is p.o. tolerant and anchor replaced on patient's NJ tube.  Patient given increased pain control given likely trauma  to the GI tract.  Patient given strict return precautions and agrees with discharge at this time with follow-up with her primary care provider.  Dispo: Discharge home with PCP follow-up   FINAL CLINICAL IMPRESSION(S) / ED DIAGNOSES   Final diagnoses:  Encounter for imaging study to confirm nasojejunal (NJ) tube placement   Rx / DC Orders   ED Discharge Orders          Ordered    oxyCODONE-acetaminophen (PERCOCET) 10-325 MG tablet  Every 6 hours PRN        07/04/23 0002           Note:  This document was prepared using Dragon voice recognition software and may include unintentional dictation errors.   Merwyn Katos, MD 07/04/23 [phone]

## 2023-07-04 MED ORDER — ONDANSETRON 4 MG PO TBDP
4.0000 mg | ORAL_TABLET | Freq: Once | ORAL | Status: AC
  Administered 2023-07-04: 4 mg via ORAL
  Filled 2023-07-04: qty 1

## 2023-07-04 MED ORDER — OXYCODONE-ACETAMINOPHEN 10-325 MG PO TABS: 1.5000 | ORAL_TABLET | Freq: Four times a day (QID) | ORAL | 0 refills | Status: DC | PRN

## 2023-07-04 MED ORDER — OXYCODONE-ACETAMINOPHEN 5-325 MG PO TABS
1.0000 | ORAL_TABLET | Freq: Once | ORAL | Status: AC
  Administered 2023-07-04: 1 via ORAL
  Filled 2023-07-04: qty 1

## 2023-07-04 NOTE — ED Notes (Signed)
Charge RN unable to locate anchor for pt NG Tube. Pt advised will need to return to [facility] to obtain anchor in order to help prevent repeated displacement. Pt provided with tegaderm in effort to provide support to NG tube in lieu of anchor.

## 2023-07-04 NOTE — ED Notes (Signed)
Patient discharged at this time. Ambulated to lobby with independent and steady gait. Breathing unlabored speaking in full sentences. Verbalized understanding of all discharge, follow up, and medication teaching. Discharged homed with all belongings.   

## 2023-07-05 ENCOUNTER — Ambulatory Visit: Payer: Medicaid Other | Admitting: Nurse Practitioner

## 2023-07-10 ENCOUNTER — Other Ambulatory Visit: Payer: Self-pay

## 2023-07-24 ENCOUNTER — Other Ambulatory Visit: Payer: Self-pay

## 2023-07-31 ENCOUNTER — Ambulatory Visit: Payer: Medicaid Other | Admitting: Gastroenterology

## 2023-07-31 ENCOUNTER — Ambulatory Visit (INDEPENDENT_AMBULATORY_CARE_PROVIDER_SITE_OTHER): Payer: Medicaid Other | Admitting: Gastroenterology

## 2023-07-31 ENCOUNTER — Encounter: Payer: Self-pay | Admitting: Gastroenterology

## 2023-07-31 VITALS — BP 146/93 | HR 98 | Temp 98.0°F | Ht 69.0 in | Wt 225.5 lb

## 2023-07-31 DIAGNOSIS — R112 Nausea with vomiting, unspecified: Secondary | ICD-10-CM | POA: Diagnosis not present

## 2023-07-31 DIAGNOSIS — R1012 Left upper quadrant pain: Secondary | ICD-10-CM

## 2023-07-31 MED ORDER — RIFAXIMIN 550 MG PO TABS: 550.0000 mg | ORAL_TABLET | Freq: Three times a day (TID) | ORAL | 0 refills | Status: DC

## 2023-07-31 NOTE — Patient Instructions (Addendum)
Your Xray is schedule for 08/06/2023 arrive at 8:00am for 8:30am scan at [facility] mall. Nothing to eat or drink after midnight. If you need to rescheduled please call [phone] option 3 and then option 2.

## 2023-07-31 NOTE — Progress Notes (Signed)
Arlyss Repress, MD [address]  [address]  [place], [place] [address]  Main: [phone]  Fax: [phone]    Gastroenterology Consultation  Referring Provider:     [name], NP Primary Care Physician:  [name], NP Primary Gastroenterologist:  Dr. Arlyss Repress Reason for Consultation: Chronic nausea and vomiting, chronic left upper quadrant pain        HPI:   Hannah Ballard is a 42 y.o. female referred by [name], NP  for consultation & management of chronic nausea and vomiting, chronic left upper quadrant pain.  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 Adm 04/2022 with N/V/RUQ pain-extensive neg WU @ [facility] including CT enterography, [place], EGD, MRCP, HIDA. Underwent Dx laparoscopy at [facility] 05/26/2022 and rpt IntraOp EGD with repair of internal hernia, cholecystectomy. No HH, marginal ulcer or GG fistula noted.  Approximately in June 2024, she was also on [facility] for about a month, started experiencing nausea, followed by right eye pain, diagnosed with a corneal ulcer and she was on antibiotic eyedrops.  Then, she developed COVID infection in July 2024.  Her nausea worsened, since then it has been persistent associated with vomiting. She was admitted to [facility] in early July 2024, underwent upper endoscopy which was unremarkable except for presence of Roux-en-Y gastric bypass.  Her TSH was elevated, started on levothyroxine, Mounjaro was held and treated symptomatically with Protonix, Reglan and Ativan.  Due to her persistent symptoms which have resulted in dehydration and malnourished, was followed by bariatrics at [facility], underwent nasoenteric tube placement, was on enteral tube feeds at night to meet her nutritional  requirement.  She was still feeling nauseous.  Her weight has been stable.  She accidentally pulled the nasoenteric tube and since then patient states that she has been experiencing severe stabbing, acute onset of left upper quadrant pain after eating associated with nausea and vomiting.  Although, her weight has been stable.  She reports she has mild constant discomfort in left upper quadrant which is worse after eating and last for about 5 to 10 minutes.  She underwent repeat CT scan, upper endoscopy during last week of October which were all unremarkable.  For last 3 days, she reports having loose stools, without any rectal bleeding.  She denies any abdominal bloating.  She is frustrated with ongoing symptoms, although she is able to maintain her weight without any fluid, electrolyte abnormalities.  She does consume red meat regularly.  Patient is accompanied by her husband today  NSAIDs: None  Antiplts/Anticoagulants/Anti thrombotics: None  GI Procedures:  Upper endoscopy 07/20/2023  Upper endoscopy 04/07/2023 - Normal Roux- en- Y anatomy. No erosions, marginal ulcers or GG fistula. - Wide open GE junction with suggestion of a small sliding transient hiatal hernia. FINAL MICROSCOPIC DIAGNOSIS:   A. JEJUNUM, BIOPSY:  Benign jejunal mucosa with no diagnostic abnormality   B. GASTRIC BIOPSY:  Reactive gastropathy with minimal chronic gastritis  Negative for H. pylori, intestinal metaplasia, dysplasia and carcinoma   C. ESOPHAGUS, BIOPSY:  Reactive squamous mucosa with changes suggestive of reflux esophagitis  Negative for glandular epithelium, eosinophils, dysplasia and carcinoma   Past Medical History:  Diagnosis Date   Cancer (HCC)    Hypertension    Migraines    Thyroid disease     Past Surgical  History:  Procedure Laterality Date   ABDOMINAL HYSTERECTOMY  2018   ANKLE SURGERY Left    has hardware   ANTERIOR FUSION CERVICAL SPINE  2008   APPENDECTOMY  1997   BIOPSY   04/07/2023   Procedure: BIOPSY;  Surgeon: [name], MD;  Location: [facility];  Service: Gastroenterology;;   BREAST BIOPSY     BREAST BIOPSY Right 03/12/2023   MM RT BREAST BX W LOC DEV 1ST LESION IMAGE BX SPEC STEREO GUIDE 03/12/2023 [facility]   CHOLECYSTECTOMY     ESOPHAGOGASTRODUODENOSCOPY (EGD) WITH PROPOFOL N/A 04/07/2023   Procedure: ESOPHAGOGASTRODUODENOSCOPY (EGD) WITH PROPOFOL;  Surgeon: [name], MD;  Location: [facility];  Service: Gastroenterology;  Laterality: N/A;   FRACTURE SURGERY     SHOULDER SURGERY Left    has pins   SPINE SURGERY       Current Outpatient Medications:    Cholecalciferol 1.25 MG (50000 UT) capsule, Take 1 capsule by mouth once a week., Disp: , Rfl:    levothyroxine (SYNTHROID) 150 MCG tablet, Take 1 tablet (150 mcg total) by mouth daily before breakfast., Disp: 30 tablet, Rfl: 11   ondansetron (ZOFRAN-ODT) 8 MG disintegrating tablet, Take 1 tablet (8 mg total) by mouth every 8 (eight) hours as needed for nausea or vomiting., Disp: 20 tablet, Rfl: 0   ondansetron (ZOFRAN-ODT) 8 MG disintegrating tablet, Take 1 tablet (8 mg total) by mouth every 8 (eight) hours as needed for nausea or vomiting., Disp: 30 tablet, Rfl: 0   Oxycodone HCl 10 MG TABS, Take by mouth., Disp: , Rfl:    pantoprazole (PROTONIX) 40 MG tablet, Take 1 tablet (40 mg total) by mouth daily., Disp: 30 tablet, Rfl: 0   prochlorperazine (COMPAZINE) 25 MG suppository, Place 1 suppository (25 mg total) rectally every 12 (twelve) hours as needed for nausea, Disp: 12 suppository, Rfl: 0   promethazine (PHENERGAN) 12.5 MG suppository, Place 1 suppository (12.5 mg total) rectally every 6 (six) hours as needed., Disp: 12 each, Rfl: 0   rifaximin (XIFAXAN) 550 MG TABS tablet, Take 1 tablet (550 mg total) by mouth 3 (three) times daily for 14 days., Disp: 42 tablet, Rfl: 0   Family History  Problem Relation Age of Onset   Healthy Mother    Healthy Father      Social History    Tobacco Use   Smoking status: Former    Current packs/day: 0.00    Types: Cigarettes    Quit date: 2019    Years since quitting: 5.8   Smokeless tobacco: Never  Vaping Use   Vaping status: Never Used  Substance Use Topics   Alcohol use: Never   Drug use: Never    Allergies as of 07/31/2023 - Review Complete 07/31/2023  Allergen Reaction Noted   Aspirin Swelling and Anaphylaxis 03/21/2012   Morphine Anaphylaxis and Swelling 04/20/2021    Review of Systems:    All systems reviewed and negative except where noted in HPI.   Physical Exam:  BP (!) 146/93 (BP Location: Right Arm, Patient Position: Sitting, Cuff Size: Large)   Pulse 98   Temp 98 F (36.7 C) (Oral)   Ht 5\' 9"  (1.753 m)   Wt 225 lb 8 oz (102.3 kg)   BMI 33.30 kg/m  No LMP recorded. Patient has had a hysterectomy.  General:   Alert,  Well-developed, well-nourished, pleasant and cooperative in NAD Head:  Normocephalic and atraumatic. Eyes:  Sclera clear, no icterus.   Conjunctiva pink. Ears:  Normal auditory  acuity. Nose:  No deformity, discharge, or lesions. Mouth:  No deformity or lesions,oropharynx pink & moist. Neck:  Supple; no masses or thyromegaly. Lungs:  Respirations even and unlabored.  Clear throughout to auscultation.   No wheezes, crackles, or rhonchi. No acute distress. Heart:  Regular rate and rhythm; no murmurs, clicks, rubs, or gallops. Abdomen:  Normal bowel sounds. Soft, localized tenderness in left upper quadrant area with voluntary guarding and non-distended without masses, hepatosplenomegaly or hernias noted.  No guarding or rebound tenderness, purple striae on her entire abdomen,.   Rectal: Not performed Msk:  Symmetrical without gross deformities. Good, equal movement & strength bilaterally. Pulses:  Normal pulses noted. Extremities:  No clubbing or edema.  No cyanosis. Neurologic:  Alert and oriented x3;  grossly normal neurologically. Skin:  Intact without significant lesions or  rashes. No jaundice. Psych:  Alert and cooperative. Normal mood and affect.  Imaging Studies: Reviewed  Assessment and Plan:   Hannah Ballard is a 42 y.o. female with history of morbid obesity, Graves' disease status post total thyroidectomy in 01/2021, hypothyroidism on Synthroid, Roux-en-Y gastric bypass in 06/2021, s/p cholecystectomy in 04/2022, post bariatric surgery complicated by intractable nausea, vomiting since 03/2023 now with left upper quadrant pain for last 1 month  Intractable nausea, vomiting and left upper quadrant pain Differentials include gastroparesis, small bowel dysmotility or intermittent partial small bowel obstructions secondary to adhesions because the symptoms are worse postprandial Other differentials include small intestinal bacterial overgrowth or secondary pancreatic insufficiency although less likely Check food allergy profile, alpha gal panel Empirically treat for bacterial overgrowth Recommend x-ray small bowel follow-through Advised patient to eat small meals, eliminate all the processed foods Also, evaluate for any micronutrient deficiencies Check for B12, thiamine, folate levels, manganese, selenium, zinc  Follow up in 3 to 4 months   Arlyss Repress, MD

## 2023-08-01 DIAGNOSIS — Z6833 Body mass index (BMI) 33.0-33.9, adult: Secondary | ICD-10-CM | POA: Diagnosis not present

## 2023-08-01 DIAGNOSIS — H16011 Central corneal ulcer, right eye: Secondary | ICD-10-CM | POA: Diagnosis not present

## 2023-08-01 DIAGNOSIS — Z79899 Other long term (current) drug therapy: Secondary | ICD-10-CM | POA: Diagnosis not present

## 2023-08-01 DIAGNOSIS — E6609 Other obesity due to excess calories: Secondary | ICD-10-CM | POA: Diagnosis not present

## 2023-08-01 DIAGNOSIS — I10 Essential (primary) hypertension: Secondary | ICD-10-CM | POA: Diagnosis not present

## 2023-08-02 DIAGNOSIS — R1012 Left upper quadrant pain: Secondary | ICD-10-CM | POA: Diagnosis not present

## 2023-08-04 LAB — ALPHA-GAL PANEL
Allergen Lamb IgE: <0.1 kU/L
Beef IgE: <0.1 kU/L
IgE (Immunoglobulin E), Serum: 23 [IU]/mL (ref 6–495)
O215-IgE Alpha-Gal: <0.1 kU/L
Pork IgE: <0.1 kU/L

## 2023-08-04 LAB — FOOD ALLERGY PROFILE
Allergen Corn, IgE: <0.1 kU/L
Clam IgE: <0.1 kU/L
Codfish IgE: <0.1 kU/L
Egg White IgE: <0.1 kU/L
Milk IgE: <0.1 kU/L
Peanut IgE: <0.1 kU/L
Scallop IgE: <0.1 kU/L
Sesame Seed IgE: <0.1 kU/L
Shrimp IgE: <0.1 kU/L
Soybean IgE: <0.1 kU/L
Walnut IgE: <0.1 kU/L
Wheat IgE: <0.1 kU/L

## 2023-08-06 ENCOUNTER — Ambulatory Visit: Payer: Medicaid Other | Attending: Gastroenterology

## 2023-08-07 ENCOUNTER — Other Ambulatory Visit: Payer: Self-pay | Admitting: Nurse Practitioner

## 2023-08-07 DIAGNOSIS — N6011 Diffuse cystic mastopathy of right breast: Secondary | ICD-10-CM

## 2023-08-13 ENCOUNTER — Telehealth: Payer: Self-pay

## 2023-08-13 MED ORDER — METRONIDAZOLE 250 MG PO TABS: 250.0000 mg | ORAL_TABLET | Freq: Four times a day (QID) | ORAL | 0 refills | Status: AC

## 2023-08-13 NOTE — Addendum Note (Signed)
Addended by: Radene Knee L on: 08/13/2023 04:29 PM   Modules accepted: Orders

## 2023-08-13 NOTE — Telephone Encounter (Signed)
Insurance denied the PA on the Xifaxan please advise what you recommend

## 2023-08-13 NOTE — Telephone Encounter (Signed)
Called and left a message for call back for patient. Sent medication to the pharmacy. Sent mychart message

## 2023-08-13 NOTE — Telephone Encounter (Signed)
Submitted PA on Surescripts Process Status  Provider  Patient  DOB  Created  Description  ePA Case In Progress [name], Hannah Ballard [birth year] XIFAXAN 550MG  TAB  Waiting on response from insurance company

## 2023-08-24 ENCOUNTER — Encounter: Payer: Self-pay | Admitting: Pharmacist

## 2023-08-24 ENCOUNTER — Other Ambulatory Visit: Payer: Self-pay

## 2023-08-24 ENCOUNTER — Other Ambulatory Visit (HOSPITAL_COMMUNITY): Payer: Self-pay

## 2023-08-28 ENCOUNTER — Other Ambulatory Visit: Payer: Self-pay

## 2023-08-29 DIAGNOSIS — E6609 Other obesity due to excess calories: Secondary | ICD-10-CM | POA: Diagnosis not present

## 2023-08-29 DIAGNOSIS — H16011 Central corneal ulcer, right eye: Secondary | ICD-10-CM | POA: Diagnosis not present

## 2023-08-29 DIAGNOSIS — Z6833 Body mass index (BMI) 33.0-33.9, adult: Secondary | ICD-10-CM | POA: Diagnosis not present

## 2023-08-29 DIAGNOSIS — Z79899 Other long term (current) drug therapy: Secondary | ICD-10-CM | POA: Diagnosis not present

## 2023-09-22 DIAGNOSIS — R112 Nausea with vomiting, unspecified: Secondary | ICD-10-CM | POA: Diagnosis not present

## 2023-09-22 DIAGNOSIS — I10 Essential (primary) hypertension: Secondary | ICD-10-CM | POA: Diagnosis not present

## 2023-09-22 DIAGNOSIS — R110 Nausea: Secondary | ICD-10-CM | POA: Diagnosis not present

## 2023-09-22 DIAGNOSIS — Z1152 Encounter for screening for COVID-19: Secondary | ICD-10-CM | POA: Diagnosis not present

## 2023-09-22 DIAGNOSIS — R1013 Epigastric pain: Secondary | ICD-10-CM | POA: Diagnosis not present

## 2023-09-22 DIAGNOSIS — Z9884 Bariatric surgery status: Secondary | ICD-10-CM | POA: Diagnosis not present

## 2023-09-22 DIAGNOSIS — F17290 Nicotine dependence, other tobacco product, uncomplicated: Secondary | ICD-10-CM | POA: Diagnosis not present

## 2023-09-22 DIAGNOSIS — R000 Tachycardia, unspecified: Secondary | ICD-10-CM | POA: Diagnosis not present

## 2023-09-22 DIAGNOSIS — Z79890 Hormone replacement therapy (postmenopausal): Secondary | ICD-10-CM | POA: Diagnosis not present

## 2023-09-22 DIAGNOSIS — E079 Disorder of thyroid, unspecified: Secondary | ICD-10-CM | POA: Diagnosis not present

## 2023-09-22 DIAGNOSIS — K561 Intussusception: Secondary | ICD-10-CM | POA: Diagnosis not present

## 2023-09-23 DIAGNOSIS — K561 Intussusception: Secondary | ICD-10-CM | POA: Diagnosis not present

## 2023-09-23 DIAGNOSIS — R1013 Epigastric pain: Secondary | ICD-10-CM | POA: Diagnosis not present

## 2023-09-28 DIAGNOSIS — Z6834 Body mass index (BMI) 34.0-34.9, adult: Secondary | ICD-10-CM | POA: Diagnosis not present

## 2023-09-28 DIAGNOSIS — Z3200 Encounter for pregnancy test, result unknown: Secondary | ICD-10-CM | POA: Diagnosis not present

## 2023-09-28 DIAGNOSIS — H16011 Central corneal ulcer, right eye: Secondary | ICD-10-CM | POA: Diagnosis not present

## 2023-09-28 DIAGNOSIS — E6609 Other obesity due to excess calories: Secondary | ICD-10-CM | POA: Diagnosis not present

## 2023-09-28 DIAGNOSIS — Z79899 Other long term (current) drug therapy: Secondary | ICD-10-CM | POA: Diagnosis not present

## 2023-10-02 DIAGNOSIS — Z79899 Other long term (current) drug therapy: Secondary | ICD-10-CM | POA: Diagnosis not present

## 2023-10-26 DIAGNOSIS — Z6834 Body mass index (BMI) 34.0-34.9, adult: Secondary | ICD-10-CM | POA: Diagnosis not present

## 2023-10-26 DIAGNOSIS — H16011 Central corneal ulcer, right eye: Secondary | ICD-10-CM | POA: Diagnosis not present

## 2023-10-26 DIAGNOSIS — E6609 Other obesity due to excess calories: Secondary | ICD-10-CM | POA: Diagnosis not present

## 2023-10-26 DIAGNOSIS — R030 Elevated blood-pressure reading, without diagnosis of hypertension: Secondary | ICD-10-CM | POA: Diagnosis not present

## 2023-10-26 DIAGNOSIS — Z79899 Other long term (current) drug therapy: Secondary | ICD-10-CM | POA: Diagnosis not present

## 2023-10-26 DIAGNOSIS — Z3200 Encounter for pregnancy test, result unknown: Secondary | ICD-10-CM | POA: Diagnosis not present

## 2023-11-08 ENCOUNTER — Ambulatory Visit
Admission: RE | Admit: 2023-11-08 | Discharge: 2023-11-08 | Disposition: A | Discharge status: Home / Self Care | Payer: Medicaid Other | Source: Ambulatory Visit | Attending: Nurse Practitioner

## 2023-11-08 DIAGNOSIS — N6011 Diffuse cystic mastopathy of right breast: Secondary | ICD-10-CM

## 2023-11-21 DIAGNOSIS — Z6834 Body mass index (BMI) 34.0-34.9, adult: Secondary | ICD-10-CM | POA: Diagnosis not present

## 2023-11-21 DIAGNOSIS — R030 Elevated blood-pressure reading, without diagnosis of hypertension: Secondary | ICD-10-CM | POA: Diagnosis not present

## 2023-11-21 DIAGNOSIS — E6609 Other obesity due to excess calories: Secondary | ICD-10-CM | POA: Diagnosis not present

## 2023-11-21 DIAGNOSIS — Z79899 Other long term (current) drug therapy: Secondary | ICD-10-CM | POA: Diagnosis not present

## 2023-11-21 DIAGNOSIS — Z3200 Encounter for pregnancy test, result unknown: Secondary | ICD-10-CM | POA: Diagnosis not present

## 2023-11-21 DIAGNOSIS — H16011 Central corneal ulcer, right eye: Secondary | ICD-10-CM | POA: Diagnosis not present

## 2023-11-22 ENCOUNTER — Other Ambulatory Visit (HOSPITAL_COMMUNITY): Payer: Self-pay

## 2023-11-22 ENCOUNTER — Telehealth: Payer: Self-pay

## 2023-11-22 NOTE — Telephone Encounter (Signed)
 Pharmacy Patient Advocate Encounter   Received notification from CoverMyMeds that prior authorization for [facility] 0.25MG /0.5ML auto-injectors is required/requested.   Insurance verification completed.   The patient is insured through [facility] .   Per test claim: PA required

## 2023-11-23 DIAGNOSIS — Z79899 Other long term (current) drug therapy: Secondary | ICD-10-CM | POA: Diagnosis not present

## 2023-11-23 NOTE — Telephone Encounter (Signed)
Left message to call the office back to schedule a follow up appointment.

## 2023-11-27 NOTE — Telephone Encounter (Signed)
 Patient is scheduled to come in for a visit on 11/30/23 at 11:20.

## 2023-11-30 ENCOUNTER — Ambulatory Visit (INDEPENDENT_AMBULATORY_CARE_PROVIDER_SITE_OTHER): Admitting: Nurse Practitioner

## 2023-11-30 DIAGNOSIS — Z91199 Patient's noncompliance with other medical treatment and regimen due to unspecified reason: Secondary | ICD-10-CM

## 2023-11-30 NOTE — Progress Notes (Signed)
Patient did not show for the scheduled appointment.

## 2023-12-06 DIAGNOSIS — H16011 Central corneal ulcer, right eye: Secondary | ICD-10-CM | POA: Diagnosis not present

## 2023-12-06 DIAGNOSIS — D539 Nutritional anemia, unspecified: Secondary | ICD-10-CM | POA: Diagnosis not present

## 2023-12-06 DIAGNOSIS — R5383 Other fatigue: Secondary | ICD-10-CM | POA: Diagnosis not present

## 2023-12-06 DIAGNOSIS — E559 Vitamin D deficiency, unspecified: Secondary | ICD-10-CM | POA: Diagnosis not present

## 2023-12-06 DIAGNOSIS — Z131 Encounter for screening for diabetes mellitus: Secondary | ICD-10-CM | POA: Diagnosis not present

## 2023-12-06 DIAGNOSIS — Z79899 Other long term (current) drug therapy: Secondary | ICD-10-CM | POA: Diagnosis not present

## 2023-12-06 DIAGNOSIS — E6609 Other obesity due to excess calories: Secondary | ICD-10-CM | POA: Diagnosis not present

## 2023-12-06 DIAGNOSIS — Z3200 Encounter for pregnancy test, result unknown: Secondary | ICD-10-CM | POA: Diagnosis not present

## 2023-12-06 DIAGNOSIS — E7800 Pure hypercholesterolemia, unspecified: Secondary | ICD-10-CM | POA: Diagnosis not present

## 2023-12-06 DIAGNOSIS — M129 Arthropathy, unspecified: Secondary | ICD-10-CM | POA: Diagnosis not present

## 2023-12-09 ENCOUNTER — Other Ambulatory Visit: Payer: Self-pay

## 2023-12-09 ENCOUNTER — Other Ambulatory Visit: Payer: Self-pay | Admitting: Nurse Practitioner

## 2023-12-09 DIAGNOSIS — R519 Headache, unspecified: Secondary | ICD-10-CM | POA: Diagnosis not present

## 2023-12-09 DIAGNOSIS — Z79891 Long term (current) use of opiate analgesic: Secondary | ICD-10-CM | POA: Diagnosis not present

## 2023-12-09 DIAGNOSIS — Z885 Allergy status to narcotic agent status: Secondary | ICD-10-CM | POA: Diagnosis not present

## 2023-12-09 DIAGNOSIS — F17290 Nicotine dependence, other tobacco product, uncomplicated: Secondary | ICD-10-CM | POA: Diagnosis not present

## 2023-12-09 DIAGNOSIS — E538 Deficiency of other specified B group vitamins: Secondary | ICD-10-CM | POA: Diagnosis not present

## 2023-12-09 DIAGNOSIS — E890 Postprocedural hypothyroidism: Secondary | ICD-10-CM | POA: Diagnosis not present

## 2023-12-09 DIAGNOSIS — Z79890 Hormone replacement therapy (postmenopausal): Secondary | ICD-10-CM | POA: Diagnosis not present

## 2023-12-09 DIAGNOSIS — I10 Essential (primary) hypertension: Secondary | ICD-10-CM | POA: Diagnosis not present

## 2023-12-09 DIAGNOSIS — H5711 Ocular pain, right eye: Secondary | ICD-10-CM | POA: Diagnosis not present

## 2023-12-09 DIAGNOSIS — Z886 Allergy status to analgesic agent status: Secondary | ICD-10-CM | POA: Diagnosis not present

## 2023-12-09 DIAGNOSIS — Z79899 Other long term (current) drug therapy: Secondary | ICD-10-CM | POA: Diagnosis not present

## 2023-12-09 DIAGNOSIS — E039 Hypothyroidism, unspecified: Secondary | ICD-10-CM | POA: Diagnosis not present

## 2023-12-09 DIAGNOSIS — G8929 Other chronic pain: Secondary | ICD-10-CM | POA: Diagnosis not present

## 2023-12-09 DIAGNOSIS — Z882 Allergy status to sulfonamides status: Secondary | ICD-10-CM | POA: Diagnosis not present

## 2023-12-09 DIAGNOSIS — K219 Gastro-esophageal reflux disease without esophagitis: Secondary | ICD-10-CM | POA: Diagnosis not present

## 2023-12-09 NOTE — ED Provider Notes (Signed)
 ED Progress Note  Chief Complaint  Patient presents with  . Eye Pain   Received sign out from previous provider.  Briefly, patient was transferred from [facility] with right eye pain and blurry vision, no history of trauma, not a contact wearer.  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 >> Pertinent exam findings include conjunctival injection, unremarkable IOP, visual acuity 20/100 on right compared to 20/50 on left [ ]  Follow-up ophthalmology recs  ED Course as of 12/10/23 0043  Sun Dec 09, 2023  1736 On reassessment/my initial assessment, patient states she threw up the nausea medicine and requested via IV.  Will order IV Compazine , she and her husband state this worked well for her earlier this morning.  1914 Repaged ophthalmology as they have not been down to evaluate patient or provided any updates, I see no consult note begun.  2223 MRI, MRA CNII protocol  BID erythromycin R eye  Mon Dec 10, 2023  9956 Patient signed out to oncoming provider.  At time of signout, patient pending MRI, MRA.

## 2023-12-09 NOTE — ED Provider Notes (Signed)
 [facility] Emergency Department Progress Note for Continuation of Care   December 09, 2023 3:32 PM  Hannah Ballard is a 43 y.o. female who was transported to this emergency department from the [facility] Emergency Department.  I have reviewed the available documentation and test results.   Brief History and Assessment:   Briefly, this is a 43 y.o. 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 She endorses blurry vision in her R eye with associated nausea. She states that 3 days ago, she thought she got something in her eye while potting plants. She denies any trauma to the eye.  She does not wear contacts. She has had chronic pain and blurry vision of right eye since endophthalmitis but states this feels worse. No fevers. No localizing numbness or weakness. Exam performed by previous provider noted that right eye had 4 mm pupil, sluggishly reactive.  Conjunctival injection.  Extraocular movements were intact.  Left eye was 3 mm pupil, normal-appearing sclera, extraocular movements intact. Intraocular pressure 17 on right, 18 on left. Visual acuity was 20/100 on right, 20/50 on left. There were no defects and no Seidel sign appreciated on fluorescein  stain. Eyelid inverted, no foreign body appreciated. Patient did not receive any relief from tetracaine  drops x 2. Lab work and CTA brain at [facility] was reassuring. Patient transferred ED to ED for ophthalmology evaluation.     [facility] ED Course:   ED Course as of 12/09/23 1726  Sun Dec 09, 2023  1552 Paged ophthalmology  1622 Spoke with Ronnald Perna with ophthalmology,  she is familiar with patient and will come down to evaluate her shortly  1626 Patient complaining of significant pain and nausea, ordered Zofran  and oxycodone  5 mg.  Patient tolerated this earlier today without issue.  1718 Signed out to oncoming provider     ED Clinical Impression   Final diagnoses:  Eye pain, right (Primary)  Right-sided headache   Documentation assistance was provided by Rodolph Butcher, Scribe on December 09, 2023 at 3:34 PM for Morene Solian, MD.  December 09, 2023 5:27 PM. Documentation assistance provided by the scribe. I was present during the time the encounter was recorded. The information recorded by the scribe was done at my direction and has been reviewed and validated by me.     Note has been documented by Pierangelo Nitzkowski on 12/09/2023

## 2023-12-09 NOTE — ED Provider Notes (Signed)
 [facility] Emergency Department HBR Transfer Attestation Note     ED Clinical Impression    Final diagnoses:  Eye pain, right (Primary)  Right-sided headache      ED Attending Physician Teaching Attestation    I supervised care provided by the resident. We have discussed the case. I have reviewed the note and agree with the plan of treatment except as documented in my note.  I have personally performed a face-to-face diagnostic evaluation on this patient.     Portions of this record may have been created using Scientist, clinical (histocompatibility and immunogenetics). Dictation errors have been sought, but may not have been identified and corrected.  See chart and resident/APP provider documentation for details.

## 2023-12-10 DIAGNOSIS — Z79899 Other long term (current) drug therapy: Secondary | ICD-10-CM | POA: Diagnosis not present

## 2023-12-10 DIAGNOSIS — H5711 Ocular pain, right eye: Secondary | ICD-10-CM | POA: Diagnosis not present

## 2023-12-10 DIAGNOSIS — R519 Headache, unspecified: Secondary | ICD-10-CM | POA: Diagnosis not present

## 2023-12-10 NOTE — ED Provider Notes (Signed)
 ED Progress Note  Received sign out from previous provider.  Patient Summary: Hannah Ballard is a 43 y.o. 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 Patient states symptoms woke her from sleep at 1:30 AM.  Action List:  MRI pending  Updates ED Course as of 12/10/23 2244  Mon Dec 10, 2023  0331 MRI without any acute pathology.  Will discharge patient with close follow-up with ophthalmology.

## 2023-12-12 DIAGNOSIS — H5711 Ocular pain, right eye: Secondary | ICD-10-CM | POA: Diagnosis not present

## 2023-12-12 DIAGNOSIS — Z79899 Other long term (current) drug therapy: Secondary | ICD-10-CM | POA: Diagnosis not present

## 2023-12-12 DIAGNOSIS — I10 Essential (primary) hypertension: Secondary | ICD-10-CM | POA: Diagnosis not present

## 2023-12-12 DIAGNOSIS — Z886 Allergy status to analgesic agent status: Secondary | ICD-10-CM | POA: Diagnosis not present

## 2023-12-12 DIAGNOSIS — Z79890 Hormone replacement therapy (postmenopausal): Secondary | ICD-10-CM | POA: Diagnosis not present

## 2023-12-12 DIAGNOSIS — H2702 Aphakia, left eye: Secondary | ICD-10-CM | POA: Diagnosis not present

## 2023-12-12 DIAGNOSIS — Z90710 Acquired absence of both cervix and uterus: Secondary | ICD-10-CM | POA: Diagnosis not present

## 2023-12-12 DIAGNOSIS — Z882 Allergy status to sulfonamides status: Secondary | ICD-10-CM | POA: Diagnosis not present

## 2023-12-12 DIAGNOSIS — H5704 Mydriasis: Secondary | ICD-10-CM | POA: Diagnosis not present

## 2023-12-12 DIAGNOSIS — Z87891 Personal history of nicotine dependence: Secondary | ICD-10-CM | POA: Diagnosis not present

## 2023-12-12 DIAGNOSIS — Z885 Allergy status to narcotic agent status: Secondary | ICD-10-CM | POA: Diagnosis not present

## 2023-12-12 NOTE — Progress Notes (Signed)
-------------------------------------------------------------------------------   Attestation with edits by Ranjan, Sinthu Sivarama, MD at 12/12/23 1304 (Updated) I was present with resident during the history and exam.  I discussed the findings, assessment, and plan with the resident and agree with the findings and plan as documented in the resident's note.  I personally spent 32 minutes face-to-face and non-face-to-face in the care of this patient, which includes all pre, intra, and post visit time on the date of service.  All documented time was specific to the E/M visit and does not include any procedures that may have been performed.    Sinthu Ranjan MD  -------------------------------------------------------------------------------  Pt with hx of thyroid  cancer s/p thyroidectomy, gastric bypass, hysterectomy, corenal ulcer OD c/b endophthalmitis,   #Anisocoria, dilated right pupil  #Right retro-orbital pain #Resolving Corneal Irritation, right eye - Pt endorses sudden onset retro-orbital right eye pain with onset 1:30 AM this morning and persisting at 5/10 intensity since then without increase or decrease.  - Pt denies pain with EOMs or photophobia. Endorses blurred vision OD.  - Hx planting with fertilizer and tomatoes use on Friday 3/14 and reports getting debris in right eye.  - VA PH 20/40, IOP 18, no rAPD, color vision full - EOMs full, no ptosis  - Pupil 8mm minimally reactive OD - Anisocoria increased in bright light conditions, decreased in dark - Right pupil does not constrict to accommodate at near  - Exam: 1+ conjunctival injection, PEE temporal half of cornea, AC deep and quiet, iris round without signs of TID or trauma, fundus exam wnl   Interval update 12/12/23: Patient reports ongoing retro-orbital pain which has not improved. Feels vision is worsening. Photophobia and pain with palpation to OD.  Imaging (MRI/CTA) negative on 3/16 Exam with continued anisocoria (6 mm OD,  3 mm OS in dark  5.5 mm in OD, 2.5 mm in OS in light), 1+ injection OD, IT scar, AC with rare cell.  Pain did not improve with instillation of topical proparacaine.  Impression: given continued pain with rare cell, will treat inflammatory component to assess if this improves patient's symptoms. Discussed with patient and husband that we do not see any overt reason for retro-orbital pain given reassuring exam and imaging. Considered oral NSAIDs given pain with palpation however patient unable to take due to gastric bypass. Will rx PF and see back next week in neurooph to assess improvement, and for further evaluation of anisocoria. May consider oral steroids.   Plan: - PF QID - Continue erythromycin  - cannot trial oral NSAIDs for possible EOM myositis vs scleritis due to hx of gastric bypass (was told cannot use nsaids ever again)  RTC 3/25 Neuro CAP

## 2023-12-12 NOTE — ED Notes (Signed)
 [facility] Emergency Department Emergency Department Attestation Note    ED Attending Physician Teaching Attestation    I supervised care provided by the resident/APP. We have discussed the case, I have reviewed the note and I agree with the plan of treatment except as documented in my note.  I have personally performed a face-to-face diagnostic evaluation on this patient.    ED Attending Note    ED Triage Vitals  Enc Vitals Group     BP 12/12/23 0349 170/110     Heart Rate 12/12/23 0348 84     SpO2 Pulse --      Resp 12/12/23 0348 18     Temp 12/12/23 0348 36.9 C (98.5 F)     Temp Source 12/12/23 0348 Oral     SpO2 12/12/23 0348 100 %     Weight 12/12/23 0348 (!) 104.3 kg (230 lb)     Height --      Head Circumference --      Peak Flow --      Pain Score --      Pain Loc --      Pain Education --      Exclude from Growth Chart --      MDM  Social Drivers of Health with Concerns   Tobacco Use: Medium Risk (12/12/2023)   Patient History   . Smoking Tobacco Use: Former   . Smokeless Tobacco Use: Never   . Passive Exposure: Not on file  Transportation Needs: Unmet Transportation Needs (07/20/2023)   Received from [facility]   [facility] - Transportation   . In the past 12 months, has lack of transportation kept you from medical appointments or from getting medications?: No   . Lack of Transportation (Non-Medical): Yes  Stress: Stress Concern Present (02/07/2023)   Received from [facility] Occupational [facility] - Occupational Stress Questionnaire   . Feeling of Stress : Very much  Interpersonal Safety: Not on file  Substance Use: Not on file (08/01/2023)  Depression: At risk (05/24/2022)   PHQ-2   . PHQ-2 Score: 3  Internet Connectivity: Not on file  Health Literacy: Not on file    See chart and resident/APP documentation for details.    Additional Medical Decision Making and Disclaimers   I have reviewed the vital signs and the  nursing notes. Labs and radiology results that were available during my care of the patient were independently reviewed by me and considered in my medical decision making.   Portions of this record have been created using [employer], clinical (histocompatibility and immunogenetics). Dictation errors have been sought, but may not have been identified and corrected.

## 2023-12-12 NOTE — ED Provider Notes (Signed)
 [facility] Emergency Department Provider Note   ED Clinical Impression   Final diagnoses:  Acute right eye pain (Primary)    HPI, ED Course, Assessment and Plan   Initial Clinical Impression:  December 12, 2023 6:21 AM  Hannah Ballard is a 43 y.o. female with past medical history of prior right eye corneal ulcer presenting with right eye pain.  Patient reports 1.5 days of sharp right eye pain with associated redness and tearing as well as associated blurry vision.  She has developed nausea and nonbloody, nonbilious emesis.  She was initially seen in the emergency department on 12/09/2023 for symptoms of right eye pain and blurry vision who was evaluated by ophthalmology found to have anisocoria and thought to be secondary to corneal irritation.  With evaluation of anisocoria she underwent head imaging including CTA brain as well as MRI/MRA without any acute findings.  She was discharged home on erythromycin ointment twice daily and advised to follow-up with [facility].  Patient has been compliant with her erythromycin ointment and is continue to have persistent pain now developing nausea and vomiting.  Denies any new eye trauma or injuries.  Does not wear contacts.  BP 170/110   Pulse 84   Temp 36.9 C (98.5 F) (Oral)   Resp 18   Wt (!) 104.3 kg (230 lb)   SpO2 100%   BMI 34.97 kg/m   On exam, patient is well-appearing in no acute distress.  She has injected conjunctive of the right eye with a dilated pupil compared to the left.  There is watery tearing.  Extraocular movements are intact without associated pain.  Tetracaine  drops applied without any relief of eye pain.  Fluorescein  stain performed without any uptake with no suspicion for corneal abrasion or ulceration.  Pressures were measured at 7, 8, and 8 without any evidence of acute angle glaucoma.  Left eye exam is normal.  Patient already had significant imaging to evaluate for intracranial pathology or compressive nerve  pathology.  Differential also includes ocular migraine.  Will trial 100% oxygen for 1520 minutes and reassess.  Will also give Toradol  and Compazine  for pain and nausea control.  Further ED updates and updates to plan as per ED Course below:  ED Course:  Patient signed out to oncoming ED provider pending evaluation after oxygen and medication.  Social Drivers of Health with Concerns   Tobacco Use: Medium Risk (12/12/2023)   Patient History   . Smoking Tobacco Use: Former   . Smokeless Tobacco Use: Never   . Passive Exposure: Not on file  Transportation Needs: Unmet Transportation Needs (07/20/2023)   Received from [facility]   [facility] - Transportation   . In the past 12 months, has lack of transportation kept you from medical appointments or from getting medications?: No   . Lack of Transportation (Non-Medical): Yes  Stress: Stress Concern Present (02/07/2023)   Received from [facility] of Occupational Health - Occupational Stress Questionnaire   . Feeling of Stress : Very much  Interpersonal Safety: Not on file  Substance Use: Not on file (08/01/2023)  Depression: At risk (05/24/2022)   PHQ-2   . PHQ-2 Score: 3  Internet Connectivity: Not on file  Health Literacy: Not on file   _____________________________________________________________________  The case was discussed with the attending physician who is in agreement with the above assessment and plan  Additional Medical Decision Making   I have reviewed the vital signs and the  nursing notes. Labs and radiology results that were available during my care of the patient were independently reviewed by me and considered in my medical decision making.  I independently visualized the EKG tracing if performed I independently visualized the radiology images if performed I reviewed the patient's prior medical records if available. Additional history obtained from family if available. For specific  reads/information impacting care please refer to MDM/ED Course continued documentation  Past History   PAST MEDICAL HISTORY/PAST SURGICAL HISTORY:  Past Medical History:  Diagnosis Date  . Disease of thyroid  gland   . Hypertension     Past Surgical History:  Procedure Laterality Date  . ABDOMINAL SURGERY    . ANKLE SURGERY    . APPENDECTOMY    . BREAST BIOPSY Left 01/31/2021  . FRACTURE SURGERY    . 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 LUMBAR FUSION    . PR THYROIDECTOMY N/A 01/24/2021   Procedure: THYROIDECTOMY, TOTAL OR COMPLETE;  Surgeon: Jerilynn Debby Cramp, MD;  Location: MAIN OR [facility];  Service: Surgical Oncology  . PR UPPER GI ENDOSCOPY,DIAGNOSIS N/A 05/09/2022   Procedure: UGI ENDO, INCLUDE ESOPHAGUS, STOMACH, & DUODENUM &/OR JEJUNUM; DX W/WO COLLECTION SPECIMN, BY BRUSH OR WASH;  Surgeon: Alphonsa Lav, MD;  Location: GI PROCEDURES [facility];  Service: Gastroenterology  . SHOULDER SURGERY      MEDICATIONS:  No current facility-administered medications for this encounter.  Current Outpatient Medications:  .  ergocalciferol -1,250 mcg, 50,000 unit, (VITAMIN D2-1,250 MCG, 50,000 UNIT,) 1,250 mcg (50,000 unit) capsule, Take 1 capsule (1,250 mcg total) by mouth once a week., Disp: , Rfl:  .  erythromycin (ROMYCIN) 5 mg/gram (0.5 %) ophthalmic ointment, Apply to eye two (2) times a day for 7 days. Place a 1/2 inch ribbon of ointment into the lower eyelid., Disp: 3.5 g, Rfl: 0 .  levothyroxine  (SYNTHROID ) 137 MCG tablet, Take 1 tablet (137 mcg total) by mouth daily., Disp: 90 tablet, Rfl: 1 .  multivitamin with minerals tablet, Take 1 tablet by mouth daily., Disp: , Rfl:  .  ondansetron  (ZOFRAN ) 4 MG tablet, Take 1 tablet (4 mg total) by mouth every eight (8) hours as needed for nausea., Disp: , Rfl:  .  oxyCODONE  (ROXICODONE ) 5 MG immediate release tablet, Take 1 tablet (5 mg total) by mouth every four (4) hours as needed for pain., Disp: ,  Rfl:  .  pantoprazole  (PROTONIX ) 40 MG tablet, Take 1 tablet (40 mg total) by mouth daily., Disp: , Rfl:  .  promethazine  (PHENERGAN ) 25 MG suppository, Insert 1 suppository (25 mg total) into the rectum every six (6) hours as needed for nausea., Disp: , Rfl:   ALLERGIES:  Aspirin, Opioids - morphine  analogues, Sulfa (sulfonamide antibiotics), and Morphine   SOCIAL HISTORY:  Social History   Tobacco Use  . Smoking status: Former    Current packs/day: 0.00    Types: Cigarettes    Quit date: 11/21/2010    Years since quitting: 13.0  . Smokeless tobacco: Never  . Tobacco comments:    quit 10 years ago   Substance Use Topics  . Alcohol use: Never    FAMILY HISTORY: Family History  Problem Relation Age of Onset  . Graves' disease Brother   . Breast cancer Paternal Aunt   . Breast cancer Maternal Aunt   . Ovarian cancer Maternal Aunt       Review of Systems   A review of systems was  performed and relevant portions were as noted above in HPI   Physical Exam   VITAL SIGNS:   BP 170/110   Pulse 84   Temp 36.9 C (98.5 F) (Oral)   Resp 18   Wt (!) 104.3 kg (230 lb)   SpO2 100%   BMI 34.97 kg/m   Constitutional:  Alert and oriented.  Head:  Normocephalic and atraumatic Eyes:  As above ENT:  No notable congestion, Mucous membranes moist, External ears normal, no notable stridor Cardiovascular:  Rate as vitals above. Appears warm and well perfused Respiratory:  Normal respiratory effort. Breath sounds are normal. Gastrointestinal:  Soft, non-distended, and nontender.  Genitourinary:  Deferred Musculoskeletal:   Normal range of motion in all extremities. No tenderness or edema noted in B/L lower extremities Neurologic:  5/5 strength in both the distal and proximal uppers bilaterally, 5/5 in both the distal and proximal lowers bilaterally. Sensation grossly intact throughout.  Extraocular movements intact bilaterally.  Face symmetric and facial sensation intact  bilaterally to light touch normal masseter activation bilaterally. Normal wrinkling of forehead with symmetric smile. Intact hearing bilaterally. Tongue mild-line. Palatal elevation intact bilaterally. Skin:  Skin is warm, dry and intact.    Radiology   No orders to display    Labs   Labs Reviewed - No data to display   Pertinent labs & imaging results that were available during my care of the patient were reviewed by me and considered in my medical decision making (see chart for details).  Please note- This chart has been created using [employer]. Chart creation errors have been sought, but may not always be located and such creation errors, especially pronoun confusion, do NOT reflect on the standard of medical care.    Malvina Spanner, MD Resident 12/12/23 [phone]

## 2023-12-20 ENCOUNTER — Other Ambulatory Visit: Payer: Self-pay

## 2024-01-04 DIAGNOSIS — H16011 Central corneal ulcer, right eye: Secondary | ICD-10-CM | POA: Diagnosis not present

## 2024-01-04 DIAGNOSIS — Z79899 Other long term (current) drug therapy: Secondary | ICD-10-CM | POA: Diagnosis not present

## 2024-02-01 DIAGNOSIS — Z131 Encounter for screening for diabetes mellitus: Secondary | ICD-10-CM | POA: Diagnosis not present

## 2024-02-01 DIAGNOSIS — Z79899 Other long term (current) drug therapy: Secondary | ICD-10-CM | POA: Diagnosis not present

## 2024-02-01 DIAGNOSIS — N911 Secondary amenorrhea: Secondary | ICD-10-CM | POA: Diagnosis not present

## 2024-02-01 DIAGNOSIS — D539 Nutritional anemia, unspecified: Secondary | ICD-10-CM | POA: Diagnosis not present

## 2024-02-01 DIAGNOSIS — E7800 Pure hypercholesterolemia, unspecified: Secondary | ICD-10-CM | POA: Diagnosis not present

## 2024-02-01 DIAGNOSIS — R5383 Other fatigue: Secondary | ICD-10-CM | POA: Diagnosis not present

## 2024-02-01 DIAGNOSIS — R29818 Other symptoms and signs involving the nervous system: Secondary | ICD-10-CM | POA: Diagnosis not present

## 2024-02-01 DIAGNOSIS — M129 Arthropathy, unspecified: Secondary | ICD-10-CM | POA: Diagnosis not present

## 2024-02-01 DIAGNOSIS — E559 Vitamin D deficiency, unspecified: Secondary | ICD-10-CM | POA: Diagnosis not present

## 2024-02-01 DIAGNOSIS — E6609 Other obesity due to excess calories: Secondary | ICD-10-CM | POA: Diagnosis not present

## 2024-02-01 DIAGNOSIS — H16011 Central corneal ulcer, right eye: Secondary | ICD-10-CM | POA: Diagnosis not present

## 2024-02-02 DIAGNOSIS — Z79899 Other long term (current) drug therapy: Secondary | ICD-10-CM | POA: Diagnosis not present

## 2024-02-02 DIAGNOSIS — Z6835 Body mass index (BMI) 35.0-35.9, adult: Secondary | ICD-10-CM | POA: Diagnosis not present

## 2024-02-02 DIAGNOSIS — H16011 Central corneal ulcer, right eye: Secondary | ICD-10-CM | POA: Diagnosis not present

## 2024-02-05 ENCOUNTER — Other Ambulatory Visit (HOSPITAL_BASED_OUTPATIENT_CLINIC_OR_DEPARTMENT_OTHER): Payer: Self-pay

## 2024-02-05 DIAGNOSIS — Z79899 Other long term (current) drug therapy: Secondary | ICD-10-CM | POA: Diagnosis not present

## 2024-02-05 DIAGNOSIS — R030 Elevated blood-pressure reading, without diagnosis of hypertension: Secondary | ICD-10-CM | POA: Diagnosis not present

## 2024-02-05 DIAGNOSIS — Z6835 Body mass index (BMI) 35.0-35.9, adult: Secondary | ICD-10-CM | POA: Diagnosis not present

## 2024-02-05 DIAGNOSIS — H16011 Central corneal ulcer, right eye: Secondary | ICD-10-CM | POA: Diagnosis not present

## 2024-02-05 DIAGNOSIS — H469 Unspecified optic neuritis: Secondary | ICD-10-CM | POA: Diagnosis not present

## 2024-03-06 ENCOUNTER — Other Ambulatory Visit (HOSPITAL_BASED_OUTPATIENT_CLINIC_OR_DEPARTMENT_OTHER): Payer: Self-pay

## 2024-03-06 DIAGNOSIS — H469 Unspecified optic neuritis: Secondary | ICD-10-CM | POA: Diagnosis not present

## 2024-03-06 DIAGNOSIS — Z79899 Other long term (current) drug therapy: Secondary | ICD-10-CM | POA: Diagnosis not present

## 2024-03-06 DIAGNOSIS — H16011 Central corneal ulcer, right eye: Secondary | ICD-10-CM | POA: Diagnosis not present

## 2024-03-06 DIAGNOSIS — Z6835 Body mass index (BMI) 35.0-35.9, adult: Secondary | ICD-10-CM | POA: Diagnosis not present

## 2024-03-06 DIAGNOSIS — R030 Elevated blood-pressure reading, without diagnosis of hypertension: Secondary | ICD-10-CM | POA: Diagnosis not present

## 2024-03-06 MED ORDER — OXYCODONE-ACETAMINOPHEN 10-325 MG PO TABS
1.0000 | ORAL_TABLET | ORAL | 0 refills | Status: DC | PRN
  Filled 2024-03-06: qty 150, 25d supply, fill #0

## 2024-03-07 ENCOUNTER — Other Ambulatory Visit (HOSPITAL_BASED_OUTPATIENT_CLINIC_OR_DEPARTMENT_OTHER): Payer: Self-pay

## 2024-03-09 ENCOUNTER — Other Ambulatory Visit: Payer: Self-pay

## 2024-03-09 ENCOUNTER — Emergency Department
Admission: EM | Admit: 2024-03-09 | Discharge: 2024-03-09 | Disposition: A | Discharge status: Home / Self Care | Attending: Emergency Medicine | Admitting: Emergency Medicine

## 2024-03-09 ENCOUNTER — Emergency Department

## 2024-03-09 DIAGNOSIS — R519 Headache, unspecified: Secondary | ICD-10-CM | POA: Insufficient documentation

## 2024-03-09 DIAGNOSIS — R42 Dizziness and giddiness: Secondary | ICD-10-CM | POA: Diagnosis not present

## 2024-03-09 LAB — CBC WITH DIFFERENTIAL/PLATELET
Abs Immature Granulocytes: 0.03 10*3/uL (ref 0.00–0.07)
Basophils Absolute: 0.1 10*3/uL (ref 0.0–0.1)
Basophils Relative: 1 %
Eosinophils Absolute: 0.1 10*3/uL (ref 0.0–0.5)
Eosinophils Relative: 2 %
HCT: 37.2 % (ref 36.0–46.0)
Hemoglobin: 12 g/dL (ref 12.0–15.0)
Immature Granulocytes: 0 %
Lymphocytes Relative: 25 %
Lymphs Abs: 2 10*3/uL (ref 0.7–4.0)
MCH: 30.7 pg (ref 26.0–34.0)
MCHC: 32.3 g/dL (ref 30.0–36.0)
MCV: 95.1 fL (ref 80.0–100.0)
Monocytes Absolute: 0.5 10*3/uL (ref 0.1–1.0)
Monocytes Relative: 6 %
Neutro Abs: 5.4 10*3/uL (ref 1.7–7.7)
Neutrophils Relative %: 66 %
Platelets: 329 10*3/uL (ref 150–400)
RBC: 3.91 MIL/uL (ref 3.87–5.11)
RDW: 14 % (ref 11.5–15.5)
WBC: 8.2 10*3/uL (ref 4.0–10.5)
nRBC: 0 % (ref 0.0–0.2)

## 2024-03-09 LAB — COMPREHENSIVE METABOLIC PANEL WITH GFR
ALT: 23 U/L (ref 0–44)
AST: 20 U/L (ref 15–41)
Albumin: 4.2 g/dL (ref 3.5–5.0)
Alkaline Phosphatase: 83 U/L (ref 38–126)
Anion gap: 9 (ref 5–15)
BUN: 18 mg/dL (ref 6–20)
CO2: 26 mmol/L (ref 22–32)
Calcium: 8.2 mg/dL — ABNORMAL LOW (ref 8.9–10.3)
Chloride: 105 mmol/L (ref 98–111)
Creatinine, Ser: 0.78 mg/dL (ref 0.44–1.00)
GFR, Estimated: >60 mL/min (ref >60)
Glucose, Bld: 95 mg/dL (ref 70–99)
Potassium: 3.6 mmol/L (ref 3.5–5.1)
Sodium: 140 mmol/L (ref 135–145)
Total Bilirubin: 0.5 mg/dL (ref 0.0–1.2)
Total Protein: 7.7 g/dL (ref 6.5–8.1)

## 2024-03-09 LAB — TROPONIN I (HIGH SENSITIVITY)
Troponin I (High Sensitivity): 4 ng/L (ref <18)
Troponin I (High Sensitivity): 4 ng/L (ref <18)

## 2024-03-09 LAB — MAGNESIUM: Magnesium: 2.4 mg/dL (ref 1.7–2.4)

## 2024-03-09 MED ORDER — KETOROLAC TROMETHAMINE 30 MG/ML IJ SOLN
15.0000 mg | Freq: Once | INTRAMUSCULAR | Status: AC
  Administered 2024-03-09: 15 mg via INTRAVENOUS
  Filled 2024-03-09: qty 1

## 2024-03-09 MED ORDER — HYDROMORPHONE HCL 1 MG/ML IJ SOLN
1.0000 mg | Freq: Once | INTRAMUSCULAR | Status: AC
  Administered 2024-03-09: 1 mg via INTRAVENOUS
  Filled 2024-03-09: qty 1

## 2024-03-09 MED ORDER — LACTATED RINGERS IV BOLUS
1000.0000 mL | Freq: Once | INTRAVENOUS | Status: AC
  Administered 2024-03-09: 1000 mL via INTRAVENOUS

## 2024-03-09 MED ORDER — DIPHENHYDRAMINE HCL 50 MG/ML IJ SOLN
25.0000 mg | Freq: Once | INTRAMUSCULAR | Status: AC
  Administered 2024-03-09: 25 mg via INTRAVENOUS
  Filled 2024-03-09: qty 1

## 2024-03-09 MED ORDER — BUTALBITAL-APAP-CAFFEINE 50-325-40 MG PO TABS
2.0000 | ORAL_TABLET | Freq: Once | ORAL | Status: AC
  Administered 2024-03-09: 2 via ORAL
  Filled 2024-03-09: qty 2

## 2024-03-09 MED ORDER — DROPERIDOL 2.5 MG/ML IJ SOLN
2.5000 mg | Freq: Once | INTRAMUSCULAR | Status: AC
  Administered 2024-03-09: 2.5 mg via INTRAVENOUS
  Filled 2024-03-09: qty 2

## 2024-03-09 NOTE — ED Provider Notes (Signed)
 [facility] Provider Note    Event Date/Time   First MD Initiated Contact with Patient 03/09/24 1923     (approximate)   History   Dizziness and Weakness   HPI  Hannah Ballard is a 43 y.o. female who presents to the ED for evaluation of Dizziness and Weakness   Review of [facility] medical DC summary from last year.History of Roux-en-Y gastric bypass 2022, Graves' disease s/p thyroidectomy, anxiety.  Reviewed multiple ED visits for right eye pain, as well as a hospitalization 1 year ago at [facility] for intractable nausea vomiting and right retrobulbar pain.  MRI brain, orbit, LP, neurology evaluations without clear etiology. 2 ED visits in the [facility] system in March for right eye pain.  CTA brain, MRI/MRA brain in March without acute findings.  Patient presents to the ED via EMS from her workplace for recurrence of similar right retrobulbar pain.  Reports every morning she takes both Phenergan , Zofran  as well as 2-3 times daily oxycodone  10 mg.  She has been compliant with these medications daily without significant missed dosing.  She presents to the ED because she developed rapid onset right retrobulbar pain, dots in her vision without vision loss, generalized weakness and dizziness sensation.  Reports left ankle swelling today as well.  Reports all of the symptoms are similar to her previous episodes for which she has been thoroughly evaluated.  Denies any further novel features, recent illnesses, fevers, syncope or head trauma.   Physical Exam   Triage Vital Signs: ED Triage Vitals  Encounter Vitals Group     BP 03/09/24 1925 (!) 151/107     Girls Systolic BP Percentile --      Girls Diastolic BP Percentile --      Boys Systolic BP Percentile --      Boys Diastolic BP Percentile --      Pulse Rate 03/09/24 1925 89     Resp 03/09/24 1925 20     Temp 03/09/24 1925 97.9 F (36.6 C)     Temp Source 03/09/24 1925 Oral     SpO2 03/09/24 1925 100 %     Weight 03/09/24  1923 223 lb (101.2 kg)     Height 03/09/24 1923 5' 9 (1.753 m)     Head Circumference --      Peak Flow --      Pain Score 03/09/24 1923 8     Pain Loc --      Pain Education --      Exclude from Growth Chart --     Most recent vital signs: Vitals:   03/09/24 1925 03/09/24 2200  BP: (!) 151/107 (!) 147/102  Pulse: 89 63  Resp: 20 11  Temp: 97.9 F (36.6 C)   SpO2: 100% 97%    General: Awake, no distress.  Appears photophobic, covering her eyes. CV:  Good peripheral perfusion.  Resp:  Normal effort.  Abd:  No distention.  MSK:  No deformity noted.  Mild pitting edema to bilateral lower extremities, L>R , without erythema, bruising or other skin changes.  No tenderness. Neuro:  No focal deficits appreciated. Cranial nerves II through XII intact 5/5 strength and sensation in all 4 extremities Other:  Pupils midrange and PERRL with EOM intact   ED Results / Procedures / Treatments   Labs (all labs ordered are listed, but only abnormal results are displayed) Labs Reviewed  COMPREHENSIVE METABOLIC PANEL WITH GFR - Abnormal; Notable for the following components:  Result Value   Calcium 8.2 (*)    All other components within normal limits  CBC WITH DIFFERENTIAL/PLATELET  MAGNESIUM   URINALYSIS, ROUTINE W REFLEX MICROSCOPIC  TROPONIN I (HIGH SENSITIVITY)  TROPONIN I (HIGH SENSITIVITY)    EKG Sinus rhythm with a rate of 77 bpm.  Normal axis.  QTc 506.  No STEMI.  RADIOLOGY CT head interpreted by me without evidence of acute intracranial pathology  Official radiology report(s): CT HEAD WO CONTRAST ( ) Result Date: 03/09/2024 CLINICAL DATA:  Sudden right retro bulbar pain. EXAM: CT HEAD WITHOUT CONTRAST TECHNIQUE: Contiguous axial images were obtained from the base of the skull through the vertex without intravenous contrast. RADIATION DOSE REDUCTION: This exam was performed according to the departmental dose-optimization program which includes automated exposure  control, adjustment of the mA and/or kV according to patient size and/or use of iterative reconstruction technique. COMPARISON:  March 21, 2023 FINDINGS: Brain: No evidence of acute infarction, hemorrhage, hydrocephalus, extra-axial collection or mass lesion/mass effect. Vascular: No hyperdense vessel or unexpected calcification. Skull: Normal. Negative for fracture or focal lesion. Sinuses/Orbits: No acute finding. Other: None. IMPRESSION: No acute intracranial pathology. Electronically Signed   By: Virgle Grime M.D.   On: 03/09/2024 20:30    PROCEDURES and INTERVENTIONS:  .Ultrasound ED Peripheral IV (Provider)  Date/Time: 03/09/2024 11:14 PM  Performed by: Arline Bennett, MD Authorized by: Arline Bennett, MD   Procedure details:    Indications: multiple failed IV attempts and poor IV access     Skin Prep: chlorhexidine  gluconate     Location: right cephalic v.   Angiocath:  20 G   Bedside Ultrasound Guided: Yes     Images: not archived     Patient tolerated procedure without complications: Yes     Dressing applied: Yes     Medications  lactated ringers  bolus 1,000 mL (0 mLs Intravenous Stopped 03/09/24 2249)  droperidol  (INAPSINE ) 2.5 MG/ML injection 2.5 mg (2.5 mg Intravenous Given 03/09/24 2029)  ketorolac  (TORADOL ) 30 MG/ML injection 15 mg (15 mg Intravenous Given 03/09/24 2029)  butalbital -acetaminophen -caffeine  (FIORICET ) 50-325-40 MG per tablet 2 tablet (2 tablets Oral Given 03/09/24 2100)  diphenhydrAMINE  (BENADRYL ) injection 25 mg (25 mg Intravenous Given 03/09/24 2030)  HYDROmorphone  (DILAUDID ) injection 1 mg (1 mg Intravenous Given 03/09/24 2030)     IMPRESSION / MDM / ASSESSMENT AND PLAN / ED COURSE  I reviewed the triage vital signs and the nursing notes.  Differential diagnosis includes, but is not limited to, ICH or retrobulbar hematoma, migraine headache, ischemic stroke, migrainous headaches, chronic pain syndrome  {Patient presents with symptoms of an acute illness  or injury that is potentially life-threatening.  Patient presents with acute on chronic pain of uncertain etiology but without concerning or red flag features, ultimately suitable for outpatient management.  As documented above, extensive neurologic evaluation in the past year at multiple facilities.  Similar symptoms today.  No neurologic deficits, trauma, proptosis, pupillary changes, visual acuity changes, no field cuts.  CT head is benign and I do not believe repeat MRIs would be of much value tonight.  Normal CBC, metabolic panel, troponin.  Pain is controlled and she is suitable for outpatient management.  We discussed close return precautions  Clinical Course as of 03/09/24 2314  Sun Mar 09, 2024  2002 USIV, 20g right cephalic v [DS]  1610 Reassessed.  Pain significantly improved.  She is appreciative.  We discussed workup so far and plan of care.  They are agreeable [DS]    Clinical Course  User Index [DS] Arline Bennett, MD     FINAL CLINICAL IMPRESSION(S) / ED DIAGNOSES   Final diagnoses:  Retrobulbar headache     Rx / DC Orders   ED Discharge Orders     None        Note:  This document was prepared using Dragon voice recognition software and may include unintentional dictation errors.   Arline Bennett, MD 03/09/24 [phone]

## 2024-03-09 NOTE — ED Triage Notes (Addendum)
 Pt arrives via [facility] from work. Pt started feeling sudden weakness, dizziness, and pressure behind R eye at 1700 today. Pt states she sees black dots out of the R eye. Arrives with L leg swollen. Hospitalized last summer in the ICU for 3 months. 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 Pt also complaining of an 8/10 headache.  EMS vitals: 153/103 BP

## 2024-04-01 ENCOUNTER — Emergency Department (HOSPITAL_COMMUNITY)

## 2024-04-01 ENCOUNTER — Emergency Department (HOSPITAL_BASED_OUTPATIENT_CLINIC_OR_DEPARTMENT_OTHER)
Admission: EM | Admit: 2024-04-01 | Discharge: 2024-04-02 | Disposition: A | Discharge status: Home / Self Care | Attending: Emergency Medicine | Admitting: Emergency Medicine

## 2024-04-01 ENCOUNTER — Encounter (HOSPITAL_BASED_OUTPATIENT_CLINIC_OR_DEPARTMENT_OTHER): Payer: Self-pay

## 2024-04-01 ENCOUNTER — Other Ambulatory Visit: Payer: Self-pay

## 2024-04-01 DIAGNOSIS — Z6835 Body mass index (BMI) 35.0-35.9, adult: Secondary | ICD-10-CM | POA: Diagnosis not present

## 2024-04-01 DIAGNOSIS — R000 Tachycardia, unspecified: Secondary | ICD-10-CM | POA: Diagnosis not present

## 2024-04-01 DIAGNOSIS — R112 Nausea with vomiting, unspecified: Secondary | ICD-10-CM | POA: Diagnosis not present

## 2024-04-01 DIAGNOSIS — H5711 Ocular pain, right eye: Secondary | ICD-10-CM | POA: Insufficient documentation

## 2024-04-01 DIAGNOSIS — R519 Headache, unspecified: Secondary | ICD-10-CM | POA: Insufficient documentation

## 2024-04-01 DIAGNOSIS — R030 Elevated blood-pressure reading, without diagnosis of hypertension: Secondary | ICD-10-CM | POA: Diagnosis not present

## 2024-04-01 DIAGNOSIS — E6609 Other obesity due to excess calories: Secondary | ICD-10-CM | POA: Diagnosis not present

## 2024-04-01 DIAGNOSIS — H469 Unspecified optic neuritis: Secondary | ICD-10-CM | POA: Diagnosis not present

## 2024-04-01 DIAGNOSIS — E039 Hypothyroidism, unspecified: Secondary | ICD-10-CM | POA: Diagnosis not present

## 2024-04-01 DIAGNOSIS — G4710 Hypersomnia, unspecified: Secondary | ICD-10-CM | POA: Diagnosis not present

## 2024-04-01 DIAGNOSIS — I10 Essential (primary) hypertension: Secondary | ICD-10-CM | POA: Insufficient documentation

## 2024-04-01 DIAGNOSIS — R29818 Other symptoms and signs involving the nervous system: Secondary | ICD-10-CM | POA: Diagnosis not present

## 2024-04-01 LAB — CBC WITH DIFFERENTIAL/PLATELET
Abs Immature Granulocytes: 0.02 K/uL (ref 0.00–0.07)
Basophils Absolute: 0.1 K/uL (ref 0.0–0.1)
Basophils Relative: 1 %
Eosinophils Absolute: 0.1 K/uL (ref 0.0–0.5)
Eosinophils Relative: 2 %
HCT: 38.5 % (ref 36.0–46.0)
Hemoglobin: 12.8 g/dL (ref 12.0–15.0)
Immature Granulocytes: 0 %
Lymphocytes Relative: 28 %
Lymphs Abs: 2.1 K/uL (ref 0.7–4.0)
MCH: 31.2 pg (ref 26.0–34.0)
MCHC: 33.2 g/dL (ref 30.0–36.0)
MCV: 93.9 fL (ref 80.0–100.0)
Monocytes Absolute: 0.5 K/uL (ref 0.1–1.0)
Monocytes Relative: 7 %
Neutro Abs: 4.7 K/uL (ref 1.7–7.7)
Neutrophils Relative %: 62 %
Platelets: 317 K/uL (ref 150–400)
RBC: 4.1 MIL/uL (ref 3.87–5.11)
RDW: 13.5 % (ref 11.5–15.5)
WBC: 7.5 K/uL (ref 4.0–10.5)
nRBC: 0 % (ref 0.0–0.2)

## 2024-04-01 LAB — COMPREHENSIVE METABOLIC PANEL WITH GFR
ALT: 20 U/L (ref 0–44)
AST: 22 U/L (ref 15–41)
Albumin: 3.7 g/dL (ref 3.5–5.0)
Alkaline Phosphatase: 75 U/L (ref 38–126)
Anion gap: 9 (ref 5–15)
BUN: 13 mg/dL (ref 6–20)
CO2: 22 mmol/L (ref 22–32)
Calcium: 6.7 mg/dL — ABNORMAL LOW (ref 8.9–10.3)
Chloride: 110 mmol/L (ref 98–111)
Creatinine, Ser: 0.51 mg/dL (ref 0.44–1.00)
GFR, Estimated: >60 mL/min (ref >60)
Glucose, Bld: 83 mg/dL (ref 70–99)
Potassium: 3.4 mmol/L — ABNORMAL LOW (ref 3.5–5.1)
Sodium: 141 mmol/L (ref 135–145)
Total Bilirubin: 0.3 mg/dL (ref 0.0–1.2)
Total Protein: 5.7 g/dL — ABNORMAL LOW (ref 6.5–8.1)

## 2024-04-01 MED ORDER — TETRACAINE HCL 0.5 % OP SOLN
1.0000 [drp] | Freq: Once | OPHTHALMIC | Status: AC
  Administered 2024-04-01: 1 [drp] via OPHTHALMIC
  Filled 2024-04-01: qty 4

## 2024-04-01 MED ORDER — KETOROLAC TROMETHAMINE 15 MG/ML IJ SOLN
15.0000 mg | Freq: Once | INTRAMUSCULAR | Status: AC
  Administered 2024-04-01: 15 mg via INTRAVENOUS
  Filled 2024-04-01: qty 1

## 2024-04-01 MED ORDER — METOCLOPRAMIDE HCL 5 MG/ML IJ SOLN
10.0000 mg | Freq: Once | INTRAMUSCULAR | Status: AC
  Administered 2024-04-01: 10 mg via INTRAVENOUS
  Filled 2024-04-01: qty 2

## 2024-04-01 MED ORDER — HYDROMORPHONE HCL 1 MG/ML IJ SOLN
1.0000 mg | Freq: Once | INTRAMUSCULAR | Status: AC
  Administered 2024-04-01: 1 mg via INTRAVENOUS
  Filled 2024-04-01: qty 1

## 2024-04-01 MED ORDER — GADOBUTROL 1 MMOL/ML IV SOLN
10.0000 mL | Freq: Once | INTRAVENOUS | Status: AC | PRN
  Administered 2024-04-01: 10 mL via INTRAVENOUS

## 2024-04-01 MED ORDER — DIPHENHYDRAMINE HCL 50 MG/ML IJ SOLN
12.5000 mg | Freq: Once | INTRAMUSCULAR | Status: AC
  Administered 2024-04-01: 12.5 mg via INTRAVENOUS
  Filled 2024-04-01: qty 1

## 2024-04-01 MED ORDER — PROCHLORPERAZINE EDISYLATE 10 MG/2ML IJ SOLN
10.0000 mg | Freq: Once | INTRAMUSCULAR | Status: AC
  Administered 2024-04-01: 10 mg via INTRAVENOUS
  Filled 2024-04-01: qty 2

## 2024-04-01 NOTE — ED Notes (Signed)
 Called [facility] for Consult.  Faxed Facesheet to [phone].

## 2024-04-01 NOTE — ED Provider Notes (Signed)
[facility] Provider Note   CSN: [id number] Arrival date & time: 04/01/24  1051     Patient presents with: Eye Pain   Hannah Ballard is a 43 y.o. female.   The history is provided by the patient and medical records. No language interpreter was used.  Eye Pain This is a recurrent problem. The current episode started 1 to 2 hours ago. The problem occurs rarely. The problem has not changed since onset.Associated symptoms include headaches. Pertinent negatives include no chest pain, no abdominal pain and no shortness of breath. Nothing aggravates the symptoms. Nothing relieves the symptoms. She has tried nothing for the symptoms. The treatment provided no relief.       Prior to Admission medications   Medication Sig Start Date End Date Taking? Authorizing Provider  Cholecalciferol 1.25 MG (50000 UT) capsule Take 1 capsule by mouth once a week. 05/04/23 05/03/24  [provider]  levothyroxine  (SYNTHROID ) 150 MCG tablet Take 1 tablet (150 mcg total) by mouth daily before breakfast. 06/20/23 06/19/24  Vincente Saber, NP  ondansetron  (ZOFRAN -ODT) 8 MG disintegrating tablet Take 1 tablet (8 mg total) by mouth every 8 (eight) hours as needed for nausea or vomiting. 06/04/23   Menshew, Candida LULLA Kings, PA-C  ondansetron  (ZOFRAN -ODT) 8 MG disintegrating tablet Take 1 tablet (8 mg total) by mouth every 8 (eight) hours as needed for nausea or vomiting. 06/28/23   Vincente Saber, NP  Oxycodone  HCl 10 MG TABS Take by mouth. 07/04/23   [provider]  oxyCODONE -acetaminophen  (PERCOCET) 10-325 MG tablet Take 1 tablet by mouth every 4 (four) hours as needed for pain 03/06/24     pantoprazole  (PROTONIX ) 40 MG tablet Take 1 tablet (40 mg total) by mouth daily. 06/20/23   Kaur, Charanpreet, NP  prochlorperazine  (COMPAZINE ) 25 MG suppository Place 1 suppository (25 mg total) rectally every 12 (twelve) hours as needed for nausea 06/16/23   Kaur,  Charanpreet, NP  promethazine  (PHENERGAN ) 12.5 MG suppository Place 1 suppository (12.5 mg total) rectally every 6 (six) hours as needed. 06/25/23   Kaur, Charanpreet, NP  fluticasone  (FLONASE ) 50 MCG/ACT nasal spray Place 2 sprays into both nostrils once daily as needed for Rhinitis 03/28/23 05/02/23      Allergies: Aspirin and Morphine     Review of Systems  Constitutional:  Negative for chills, diaphoresis, fatigue and fever.  HENT:  Negative for congestion.   Eyes:  Positive for pain and visual disturbance. Negative for itching.  Respiratory:  Negative for cough, chest tightness, shortness of breath and wheezing.   Cardiovascular:  Negative for chest pain, palpitations and leg swelling.  Gastrointestinal:  Positive for nausea and vomiting. Negative for abdominal pain, constipation and diarrhea.  Genitourinary:  Negative for dysuria.  Musculoskeletal:  Negative for back pain, neck pain and neck stiffness.  Skin:  Negative for rash and wound.  Neurological:  Positive for headaches. Negative for speech difficulty, weakness, light-headedness and numbness.  Psychiatric/Behavioral:  Negative for agitation and confusion.   All other systems reviewed and are negative.   Updated Vital Signs BP (!) 161/111   Pulse 96   Temp 98.6 F (37 C)   Resp 16   Ht 5' 9 (1.753 m)   Wt 104.3 kg   SpO2 100%   BMI 33.97 kg/m   Physical Exam Vitals and nursing note reviewed.  Constitutional:      General: She is not in acute distress.    Appearance: She is well-developed. She is  not ill-appearing, toxic-appearing or diaphoretic.  HENT:     Head: Normocephalic and atraumatic.     Nose: No congestion or rhinorrhea.     Mouth/Throat:     Mouth: Mucous membranes are moist.     Pharynx: No oropharyngeal exudate or posterior oropharyngeal erythema.  Eyes:     General: No scleral icterus.    Extraocular Movements: Extraocular movements intact.     Right eye: Normal extraocular motion.     Left eye:  Normal extraocular motion.     Conjunctiva/sclera: Conjunctivae normal.     Pupils: Pupils are unequal.     Right eye: Pupil is sluggish. Pupil is round and reactive.     Left eye: Pupil is round, reactive and not sluggish.     Comments: Right pupil 8 mm, left pupil 3 mm.  Right side sluggish, left side normal reactivity.  Normal extraocular movements.  No significant erythema or tenderness around the eye.  Cardiovascular:     Rate and Rhythm: Normal rate and regular rhythm.     Heart sounds: No murmur heard. Pulmonary:     Effort: Pulmonary effort is normal. No respiratory distress.     Breath sounds: Normal breath sounds. No wheezing, rhonchi or rales.  Chest:     Chest wall: No tenderness.  Abdominal:     General: Abdomen is flat.     Palpations: Abdomen is soft.     Tenderness: There is no abdominal tenderness. There is no guarding or rebound.  Musculoskeletal:        General: No swelling or tenderness.     Cervical back: Neck supple. No tenderness.  Skin:    General: Skin is warm and dry.     Capillary Refill: Capillary refill takes less than 2 seconds.     Findings: No erythema or rash.  Neurological:     General: No focal deficit present.     Mental Status: She is alert.     Sensory: No sensory deficit.     Motor: No weakness.  Psychiatric:        Mood and Affect: Mood normal.     (all labs ordered are listed, but only abnormal results are displayed) Labs Reviewed  CBC WITH DIFFERENTIAL/PLATELET  COMPREHENSIVE METABOLIC PANEL WITH GFR  COMPREHENSIVE METABOLIC PANEL WITH GFR    EKG: EKG Interpretation Date/Time:  Tuesday April 01 2024 11:02:07 EDT Ventricular Rate:  102 PR Interval:  132 QRS Duration:  97 QT Interval:  347 QTC Calculation: 452 R Axis:   18  Text Interpretation: Sinus tachycardia Baseline wander in lead(s) V3  when compared to prior, faste rate NO STEMI Reconfirmed by Ginger Barefoot ([id number]) on 04/01/2024 11:13:50 AM  Radiology: No results  found.   Procedures   Medications Ordered in the ED  HYDROmorphone  (DILAUDID ) injection 1 mg (has no administration in time range)  prochlorperazine  (COMPAZINE ) injection 10 mg (has no administration in time range)  HYDROmorphone  (DILAUDID ) injection 1 mg (1 mg Intravenous Given 04/01/24 1136)  prochlorperazine  (COMPAZINE ) injection 10 mg (10 mg Intravenous Given 04/01/24 1135)                                    Medical Decision Making Amount and/or Complexity of Data Reviewed Labs: ordered.  Risk Prescription drug management.    Hannah Ballard is a 43 y.o. female with a past medical history significant for hypertension, hypothyroidism, Graves' disease, previous gastric  bypass, previous total thyroidectomy, anxiety, and recurrent right eye pain and dilation who presents with right eye pain and dilation.  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  She says that she was unable to make the surgery and had to be rescheduled and she reports it is scheduled for later this month either on the 23rd or 28th.  Patient says she was having no episodes over the last month or 2 and has had no eye pain or symptoms and was scheduled to go do a sleep study as recommended by her PCP and while waiting in the waiting room, started having eye pain.  She reports is the same as her severe eye pains in the past.  She denies any eye trauma or anything getting into her eye.  No exposures.  She was not in the sunlight when it began.  She reports the pain in her right eye and the dilation.  She reports some mild blurry vision that is about 75% of her normal vision in the right eye she reports.  No other facial pain, neck pain or back  pain.  No fevers, chills, cough, congestion, constipation, or diarrhea.  She reports nausea and vomiting with the pain.  Denies other complaints today and this is the same as before when she had all the negative extensive workups.  On my exam, she does have a dilated right eye.  It is about 8 mm and sluggish compared to the left which is normal.  She reports her vision is slightly blurry but she denies any visual field deficits.  Normal extract movements.  Otherwise lungs clear and chest nontender.  No carotid bruit.  Patient resting comfortably aside from the severe pain.  Patient says that pain and nausea medicine has helped her in the past we will give some pain and nausea medicine.  We had a shared Szymon conversation and as she was already scheduled to have a surgery to fix it last month and is scheduled to have it in the next few weeks, instead of doing a large diagnostic workup with imaging here, we will hold on this and get some basic labs but we will go ahead and call neuro-ophthalmology.  I spoke to the transfer line at [facility] who was going to talk to the neuro-ophthalmology team to make a plan.  Will continue to hold on extensive workup at this time and await further callback.  Anticipate follow-up on [facility] recommendations.      1:01 PM Was informed that patient actually was not seen at [facility] for this scheduled surgery and it was actually going to be done at [facility].  Will call [facility] to discuss management and transfer.  Care transferred to oncoming team to wait for callback from [facility].  Anticipate disposition based on their recommendations.     Final diagnoses:  Acute right eye pain     Clinical Impression: 1. Acute right eye pain     Disposition: Transferred to oncoming team awaiting [facility] recommendations  This note was prepared with assistance of Dragon voice recognition software. Occasional wrong-word or sound-a-like substitutions may have occurred due to the inherent limitations of voice  recognition software.       Ashleigh Arya, Lonni PARAS, MD  04/01/24 1514  

## 2024-04-01 NOTE — ED Provider Notes (Signed)
  Physical Exam  BP 133/88   Pulse 88   Temp 98.6 F (37 C)   Resp 17   Ht 5' 9 (1.753 m)   Wt 104.3 kg   SpO2 93%   BMI 33.97 kg/m   Physical Exam  Procedures  Procedures  ED Course / MDM    Medical Decision Making Amount and/or Complexity of Data Reviewed Labs: ordered.  Risk Prescription drug management.   Resents because of recurrent eye pain.  Headaches.  Presented because eye pain and dilation.  She has had numerous workups for this in the past due to the severe pain, pupil dilation.  CTs MRIs and lumbar cultures have been negative.  Follow-up with neuro-ophthalmology at [facility] where she is post have surgery but she is unclear exactly what the surgery is.  Patient was not have any episodes of the past month or 2.  Patient states that today started hurting in her right eye and she noticed the dilation again.  She states it feels exactly same as previous episodes.  She had a dilated right eye initial evaluation about 8 mm.  Sluggish compared to the left she states is normal for her whenever this happens.  Waiting for neuro-ophthalmology from [facility] to call back.   Right eye pressure measured : 17 and 15   Dr. Dar[facility] ophthalmology: Had dilated right pupil during visit in March. Want to get MRI head/orbit w/wo and MRA head/orbit w/wo contrast.. if shows compressive lesion/aneurysm then needs neurology. If does not show this then needs to see [facility] ophthalmology at their outpatient. Can be seen tomorrow or later this week. She will need to call the clinic.   Patient accepted to [facility] ED by Dr. Yolande.   Patient will be taken via private vehicle by spouse.   She remains neurologically intact.  Alert and oriented x 3 GCS 15.  Only abnormality is a dilated right pupil.  Otherwise, neurologically intact.      Simon Lavonia SAILOR, MD 04/01/24 [phone]

## 2024-04-01 NOTE — ED Provider Notes (Signed)
 11:48 PM Assumed care from Dr. Melvenia, please see their note for full history, physical and decision making until this point. In brief this is a 43 y.o. year old female who presented to the ED tonight with Eye Pain     Eye pain with pupil dilation and headache. Went to [facility] and sent here for MRI's. If negative, fu w/ optho.   Reeval and patient's symptoms improved. Pupils more similar in size. MRI's negative. Stable for d/c w/ neuro/ophtho follow up. Patient ok with plan but kind of sleepy from meds so husband also made aware.  Discharge instructions, including strict return precautions for new or worsening symptoms, given. Patient and/or family verbalized understanding and agreement with the plan as described.   Labs, studies and imaging reviewed by myself and considered in medical decision making if ordered. Imaging interpreted by radiology.  Labs Reviewed  COMPREHENSIVE METABOLIC PANEL WITH GFR - Abnormal; Notable for the following components:      Result Value   Potassium 3.4 (*)    Calcium 6.7 (*)    Total Protein 5.7 (*)    All other components within normal limits  CBC WITH DIFFERENTIAL/PLATELET    MR Brain W and Wo Contrast    (Results Pending)  MR ORBITS W WO CONTRAST    (Results Pending)  MR ANGIO HEAD WO CONTRAST    (Results Pending)    No follow-ups on file.    Lorette Mayo, MD 04/02/24 [phone]

## 2024-04-01 NOTE — ED Triage Notes (Signed)
 Pt presents with right eye pain that started this am. Denies other symptoms. Eye unequal in triage.

## 2024-04-01 NOTE — ED Provider Notes (Signed)
 Care of patient assumed in transfer from Dr. Jackquline.  This patient was seen at med center for recurrent eye pain.  She began having right eye pain today noticed dilation of pupil.  Ophthalmology was consulted who recommended MRI/MRA of head and orbits.  This is to evaluate for possible compressive lesion or aneurysm.  If negative, patient to follow-up outpatient with [facility] ophthalmology. Physical Exam  BP 118/75   Pulse 65   Temp 98 F (36.7 C) (Oral)   Resp 12   Ht 5' 9 (1.753 m)   Wt 104.3 kg   SpO2 98%   BMI 33.97 kg/m   Physical Exam Vitals and nursing note reviewed.  Constitutional:      General: She is not in acute distress.    Appearance: Normal appearance. She is well-developed. She is not ill-appearing, toxic-appearing or diaphoretic.  HENT:     Head: Normocephalic and atraumatic.     Right Ear: External ear normal.     Left Ear: External ear normal.     Nose: Nose normal.     Mouth/Throat:     Mouth: Mucous membranes are moist.  Eyes:     Conjunctiva/sclera: Conjunctivae normal.     Comments: Dilated right pupil  Cardiovascular:     Rate and Rhythm: Normal rate and regular rhythm.  Pulmonary:     Effort: Pulmonary effort is normal. No respiratory distress.  Abdominal:     General: There is no distension.     Palpations: Abdomen is soft.  Musculoskeletal:        General: No swelling. Normal range of motion.     Cervical back: Normal range of motion and neck supple.  Skin:    General: Skin is warm and dry.     Coloration: Skin is not pale.  Neurological:     General: No focal deficit present.     Mental Status: She is alert and oriented to person, place, and time.  Psychiatric:        Mood and Affect: Mood normal.        Behavior: Behavior normal.     Procedures  Procedures  ED Course / MDM    Medical Decision Making Amount and/or Complexity of Data Reviewed Labs: ordered. Radiology: ordered.  Risk Prescription drug management.   Patient  presenting for recurrence of eye pain and dilated pupil.  These have been recurrent episodes for her.  Today, she experienced pain behind her right eye and subsequently had dilated pupil.  She was seen at [facility] where patient was discussed with [facility] ophthalmology.  Ophthalmology requested MRI studies.  Patient was transferred to [facility] for MRIs.  On exam, patient is resting comfortably.  She endorses ongoing pain behind her right eye.  Her right pupil is clearly dilated.  She states that this is typical of these recurrences.  Always occurs on the right side.  MRIs were ordered.  Patient was given migraine cocktail for possible complex migraine.  On reassessment, patient reports improved pain.  She still has a dilated right pupil.  MRI results pending at time of signout.  Care of patient was signed out to oncoming ED provider.       Melvenia Motto, MD 04/01/24 [phone]

## 2024-04-01 NOTE — ED Notes (Signed)
 Pt provided instruction and paper work to arrive to [facility].  [facility] is the accepting. Pt is going for MRI.  Iv access was DC here at [facility].  Report called to ED charge rn at departure

## 2024-04-01 NOTE — ED Notes (Signed)
 TONO pen placed at bedside per provider.

## 2024-04-07 ENCOUNTER — Other Ambulatory Visit (HOSPITAL_BASED_OUTPATIENT_CLINIC_OR_DEPARTMENT_OTHER): Payer: Self-pay

## 2024-04-07 DIAGNOSIS — H16011 Central corneal ulcer, right eye: Secondary | ICD-10-CM | POA: Diagnosis not present

## 2024-04-07 DIAGNOSIS — I10 Essential (primary) hypertension: Secondary | ICD-10-CM | POA: Diagnosis not present

## 2024-04-07 DIAGNOSIS — H469 Unspecified optic neuritis: Secondary | ICD-10-CM | POA: Diagnosis not present

## 2024-04-07 DIAGNOSIS — Z79899 Other long term (current) drug therapy: Secondary | ICD-10-CM | POA: Diagnosis not present

## 2024-04-07 DIAGNOSIS — Z6835 Body mass index (BMI) 35.0-35.9, adult: Secondary | ICD-10-CM | POA: Diagnosis not present

## 2024-04-07 MED ORDER — OXYCODONE-ACETAMINOPHEN 10-325 MG PO TABS
1.0000 | ORAL_TABLET | ORAL | 0 refills | Status: DC | PRN
  Filled 2024-04-07: qty 150, 25d supply, fill #0

## 2024-05-03 ENCOUNTER — Telehealth

## 2024-05-06 ENCOUNTER — Other Ambulatory Visit (HOSPITAL_COMMUNITY): Payer: Self-pay

## 2024-05-06 ENCOUNTER — Other Ambulatory Visit (HOSPITAL_BASED_OUTPATIENT_CLINIC_OR_DEPARTMENT_OTHER): Payer: Self-pay

## 2024-05-06 MED ORDER — CEPHALEXIN 500 MG PO CAPS
500.0000 mg | ORAL_CAPSULE | Freq: Three times a day (TID) | ORAL | 0 refills | Status: DC
  Filled 2024-05-06: qty 21, 7d supply, fill #0

## 2024-05-06 MED ORDER — FLUOXETINE HCL 10 MG PO CAPS
ORAL_CAPSULE | ORAL | 0 refills | Status: DC
  Filled 2024-05-06 (×2): qty 49, 28d supply, fill #0
  Filled 2024-05-06: qty 3, 3d supply, fill #0
  Filled 2024-05-06: qty 46, 25d supply, fill #0

## 2024-05-06 MED ORDER — MUPIROCIN 2 % EX OINT
TOPICAL_OINTMENT | CUTANEOUS | 0 refills | Status: DC
  Filled 2024-05-06: qty 44, 28d supply, fill #0

## 2024-05-06 MED ORDER — OXYCODONE-ACETAMINOPHEN 10-325 MG PO TABS
1.0000 | ORAL_TABLET | ORAL | 0 refills | Status: DC | PRN
  Filled 2024-05-06: qty 150, 25d supply, fill #0

## 2024-05-06 MED ORDER — IBUPROFEN 800 MG PO TABS
800.0000 mg | ORAL_TABLET | Freq: Three times a day (TID) | ORAL | 0 refills | Status: AC | PRN
  Filled 2024-05-06: qty 90, 30d supply, fill #0

## 2024-05-07 ENCOUNTER — Other Ambulatory Visit (HOSPITAL_BASED_OUTPATIENT_CLINIC_OR_DEPARTMENT_OTHER): Payer: Self-pay

## 2024-05-11 ENCOUNTER — Other Ambulatory Visit (HOSPITAL_BASED_OUTPATIENT_CLINIC_OR_DEPARTMENT_OTHER): Payer: Self-pay

## 2024-05-11 MED ORDER — WEGOVY 0.5 MG/0.5ML ~~LOC~~ SOAJ
0.5000 mg | SUBCUTANEOUS | 0 refills | Status: AC
  Filled 2024-05-11 – 2024-07-23 (×2): qty 2, 28d supply, fill #0

## 2024-05-12 ENCOUNTER — Other Ambulatory Visit (HOSPITAL_BASED_OUTPATIENT_CLINIC_OR_DEPARTMENT_OTHER): Payer: Self-pay

## 2024-05-14 ENCOUNTER — Other Ambulatory Visit (HOSPITAL_BASED_OUTPATIENT_CLINIC_OR_DEPARTMENT_OTHER): Payer: Self-pay

## 2024-05-17 ENCOUNTER — Other Ambulatory Visit (HOSPITAL_BASED_OUTPATIENT_CLINIC_OR_DEPARTMENT_OTHER): Payer: Self-pay

## 2024-05-20 ENCOUNTER — Other Ambulatory Visit: Payer: Self-pay | Admitting: Nurse Practitioner

## 2024-05-20 DIAGNOSIS — Z1231 Encounter for screening mammogram for malignant neoplasm of breast: Secondary | ICD-10-CM

## 2024-05-22 ENCOUNTER — Other Ambulatory Visit (HOSPITAL_BASED_OUTPATIENT_CLINIC_OR_DEPARTMENT_OTHER): Payer: Self-pay

## 2024-06-03 ENCOUNTER — Ambulatory Visit

## 2024-06-04 ENCOUNTER — Other Ambulatory Visit (HOSPITAL_BASED_OUTPATIENT_CLINIC_OR_DEPARTMENT_OTHER): Payer: Self-pay

## 2024-06-06 ENCOUNTER — Other Ambulatory Visit (HOSPITAL_BASED_OUTPATIENT_CLINIC_OR_DEPARTMENT_OTHER): Payer: Self-pay

## 2024-06-06 MED ORDER — OXYCODONE-ACETAMINOPHEN 10-325 MG PO TABS
1.0000 | ORAL_TABLET | ORAL | 0 refills | Status: DC | PRN
  Filled 2024-06-06: qty 150, 25d supply, fill #0

## 2024-06-11 ENCOUNTER — Ambulatory Visit
Admission: RE | Admit: 2024-06-11 | Discharge: 2024-06-11 | Disposition: A | Discharge status: Home / Self Care | Source: Ambulatory Visit | Attending: Nurse Practitioner

## 2024-06-11 DIAGNOSIS — Z1231 Encounter for screening mammogram for malignant neoplasm of breast: Secondary | ICD-10-CM

## 2024-06-18 ENCOUNTER — Other Ambulatory Visit: Payer: Self-pay

## 2024-06-18 DIAGNOSIS — I10 Essential (primary) hypertension: Secondary | ICD-10-CM | POA: Diagnosis not present

## 2024-06-18 DIAGNOSIS — R079 Chest pain, unspecified: Secondary | ICD-10-CM | POA: Diagnosis present

## 2024-06-18 NOTE — ED Triage Notes (Signed)
 Pt reports sharp chest pain in the middle of her chest that began earlier tonight, pt also reports some sob. Pt denies cough congestion or fever.

## 2024-06-19 ENCOUNTER — Emergency Department

## 2024-06-19 ENCOUNTER — Emergency Department: Admission: EM | Admit: 2024-06-19 | Discharge: 2024-06-19 | Disposition: A | Discharge status: Home / Self Care

## 2024-06-19 DIAGNOSIS — R079 Chest pain, unspecified: Secondary | ICD-10-CM

## 2024-06-19 LAB — CBC
HCT: 38.7 % (ref 36.0–46.0)
Hemoglobin: 12.5 g/dL (ref 12.0–15.0)
MCH: 30.5 pg (ref 26.0–34.0)
MCHC: 32.3 g/dL (ref 30.0–36.0)
MCV: 94.4 fL (ref 80.0–100.0)
Platelets: 336 K/uL (ref 150–400)
RBC: 4.1 MIL/uL (ref 3.87–5.11)
RDW: 12.7 % (ref 11.5–15.5)
WBC: 6.6 K/uL (ref 4.0–10.5)
nRBC: 0 % (ref 0.0–0.2)

## 2024-06-19 LAB — BASIC METABOLIC PANEL WITH GFR
Anion gap: 11 (ref 5–15)
BUN: 15 mg/dL (ref 6–20)
CO2: 25 mmol/L (ref 22–32)
Calcium: 8.1 mg/dL — ABNORMAL LOW (ref 8.9–10.3)
Chloride: 102 mmol/L (ref 98–111)
Creatinine, Ser: 0.85 mg/dL (ref 0.44–1.00)
GFR, Estimated: >60 mL/min (ref >60)
Glucose, Bld: 80 mg/dL (ref 70–99)
Potassium: 3.8 mmol/L (ref 3.5–5.1)
Sodium: 138 mmol/L (ref 135–145)

## 2024-06-19 LAB — TROPONIN I (HIGH SENSITIVITY)
Troponin I (High Sensitivity): 3 ng/L (ref <18)
Troponin I (High Sensitivity): 3 ng/L (ref <18)

## 2024-06-19 LAB — D-DIMER, QUANTITATIVE: D-Dimer, Quant: 0.44 ug{FEU}/mL (ref 0.00–0.50)

## 2024-06-19 MED ORDER — METHOCARBAMOL 500 MG PO TABS: 500.0000 mg | ORAL_TABLET | Freq: Four times a day (QID) | ORAL | 0 refills | Status: AC | PRN

## 2024-06-19 MED ORDER — HYDROMORPHONE HCL 1 MG/ML IJ SOLN
1.0000 mg | Freq: Once | INTRAMUSCULAR | Status: AC
  Administered 2024-06-19: 1 mg via INTRAVENOUS
  Filled 2024-06-19: qty 1

## 2024-06-19 MED ORDER — FAMOTIDINE IN NACL 20-0.9 MG/50ML-% IV SOLN
20.0000 mg | Freq: Once | INTRAVENOUS | Status: AC
  Administered 2024-06-19: 20 mg via INTRAVENOUS
  Filled 2024-06-19: qty 50

## 2024-06-19 MED ORDER — METHOCARBAMOL 500 MG PO TABS
750.0000 mg | ORAL_TABLET | Freq: Once | ORAL | Status: AC
  Administered 2024-06-19: 750 mg via ORAL
  Filled 2024-06-19: qty 2

## 2024-06-19 MED ORDER — ALUM & MAG HYDROXIDE-SIMETH 200-200-20 MG/5ML PO SUSP
30.0000 mL | Freq: Once | ORAL | Status: AC
  Administered 2024-06-19: 30 mL via ORAL
  Filled 2024-06-19: qty 30

## 2024-06-19 MED ORDER — DIAZEPAM 5 MG/ML IJ SOLN
2.5000 mg | Freq: Once | INTRAMUSCULAR | Status: AC
  Administered 2024-06-19: 2.5 mg via INTRAVENOUS
  Filled 2024-06-19: qty 2
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
## 2024-06-19 NOTE — ED Notes (Signed)
Lab called for blood collect 

## 2024-06-19 NOTE — Discharge Instructions (Addendum)
 You was seen in the emergency department for chest pain and shortness of breath.  Workup today was reassuring but that does not mean that nothing is wrong but rather you are safe to continue the workup as an outpatient.  Please call your primary care physician and arrange follow-up.  I have placed cardiology referral for you.  Do light stretches stick to a bland diet and continue your regular medications.  Use heat packs and gentle massage.  Return with any acutely worsening symptoms or any other emergency. -- RETURN PRECAUTIONS & AFTERCARE: (ENGLISH) RETURN PRECAUTIONS: Return immediately to the emergency department or see/call your doctor if you feel worse, weak or have changes in speech or vision, are short of breath, have fever, vomiting, pain, bleeding or dark stool, trouble urinating or any new issues. Return here or see/call your doctor if not improving as expected for your suspected condition. FOLLOW-UP CARE: Call your doctor and/or any doctors we referred you to for more advice and to make an appointment. Do this today, tomorrow or after the weekend. Some doctors only take PPO insurance so if you have HMO insurance you may want to contact your HMO or your regular doctor for referral to a specialist within your plan. Either way tell the doctor's office that it was a referral from the emergency department so you get the soonest possible appointment.  YOUR TEST RESULTS: Take result reports of any blood or urine tests, imaging tests and EKG's to your doctor and any referral doctor. Have any abnormal tests repeated. Your doctor or a referral doctor can let you know when this should be done. Also make sure your doctor contacts this hospital to get any test results that are not currently available such as cultures or special tests for infection and final imaging reports, which are often not available at the time you leave the ER but which may list additional important findings that are not documented on the  preliminary report. BLOOD PRESSURE: If your blood pressure was greater than 120/80 have your blood pressure rechecked within 1 to 2 weeks. MEDICATION SIDE EFFECTS: Do not drive, walk, bike, take the bus, etc. if you have received or are being prescribed any sedating medications such as those for pain or anxiety or certain antihistamines like Benadryl . If you have been give one of these here get a taxi home or have a friend drive you home. Ask your pharmacist to counsel you on potential side effects of any new medication

## 2024-06-29 ENCOUNTER — Encounter: Payer: Self-pay | Admitting: Emergency Medicine

## 2024-06-29 ENCOUNTER — Emergency Department
Admission: EM | Admit: 2024-06-29 | Discharge: 2024-06-29 | Disposition: A | Discharge status: Home / Self Care | Attending: Emergency Medicine | Admitting: Emergency Medicine

## 2024-06-29 ENCOUNTER — Emergency Department

## 2024-06-29 ENCOUNTER — Other Ambulatory Visit: Payer: Self-pay

## 2024-06-29 DIAGNOSIS — Y92410 Unspecified street and highway as the place of occurrence of the external cause: Secondary | ICD-10-CM | POA: Diagnosis not present

## 2024-06-29 DIAGNOSIS — M545 Low back pain, unspecified: Secondary | ICD-10-CM | POA: Insufficient documentation

## 2024-06-29 DIAGNOSIS — M542 Cervicalgia: Secondary | ICD-10-CM | POA: Diagnosis present

## 2024-06-29 DIAGNOSIS — S161XXA Strain of muscle, fascia and tendon at neck level, initial encounter: Secondary | ICD-10-CM | POA: Insufficient documentation

## 2024-06-29 DIAGNOSIS — M5412 Radiculopathy, cervical region: Secondary | ICD-10-CM

## 2024-06-29 MED ORDER — KETOROLAC TROMETHAMINE 15 MG/ML IJ SOLN
15.0000 mg | Freq: Once | INTRAMUSCULAR | Status: AC
  Administered 2024-06-29: 15 mg via INTRAMUSCULAR
  Filled 2024-06-29: qty 1

## 2024-06-29 MED ORDER — PREDNISONE 10 MG PO TABS: ORAL_TABLET | ORAL | 0 refills | Status: AC

## 2024-06-29 MED ORDER — PREDNISONE 20 MG PO TABS
40.0000 mg | ORAL_TABLET | Freq: Once | ORAL | Status: AC
  Administered 2024-06-29: 40 mg via ORAL
  Filled 2024-06-29: qty 2

## 2024-06-29 MED ORDER — METHOCARBAMOL 500 MG PO TABS
1000.0000 mg | ORAL_TABLET | Freq: Once | ORAL | Status: AC
Start: 2024-06-29 — End: 2024-06-29
  Administered 2024-06-29: 1000 mg via ORAL
  Filled 2024-06-29: qty 2

## 2024-06-29 MED ORDER — METHOCARBAMOL 500 MG PO TABS: ORAL_TABLET | ORAL | 0 refills | Status: DC

## 2024-06-29 MED ORDER — HYDROMORPHONE HCL 1 MG/ML IJ SOLN
1.0000 mg | Freq: Once | INTRAMUSCULAR | Status: AC
  Administered 2024-06-29: 1 mg via INTRAMUSCULAR
  Filled 2024-06-29: qty 1

## 2024-06-29 NOTE — Discharge Instructions (Addendum)
 Follow-up with your primary care provider or pain management for any continued problems.  Call Monday to see if it is possible to get a another prescription for oxycodone .  A prescription for methocarbamol  was sent to the pharmacy for you to begin taking.  Discontinue taking the baclofen which is the other muscle relaxant that you have at home which does not seem to be helping at this time.  Also the prednisone that was sent to the pharmacy is to begin taking tomorrow as you had the initial dose already.  This is a medication that is to be tapered down by 1 each day so tomorrow you will take 60 mg at 1 time and taper down by 1 tablet each day.  Be aware that this medication could cause increased appetite, interfere with sleep or cause irritability which is all temporary.  Use heat or ice to your muscles as needed for discomfort.  Return to the emergency department if any severe worsening of your symptoms.
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
## 2024-07-02 ENCOUNTER — Ambulatory Visit: Admitting: Cardiology

## 2024-07-02 NOTE — Progress Notes (Deleted)
  Cardiology Office Note   Date:  07/02/2024  ID:  Hannah Ballard, DOB [birth year], MRN [id number] PCP: Vincente Saber, NP  [facility] Providers Cardiologist:  None { Click to update primary MD,subspecialty MD or APP then REFRESH:1}    History of Present Illness Hannah Ballard is a 43 y.o. female with past medical history of Graves' disease status post thyroidectomy, obesity, hypertension,  Had previously been evaluated by to cardiology in 2022 when she was in the process for workup for bariatric surgery.  She was found on a screening ECG to have possible left atrial enlargement and was referred to cardiology for further evaluation.  She had previously been followed by cardiology in [facility] [place] .  Have evaluated in the past for tachycardia with high resting heart rates and elevated heart rates with activity.  Mother reports she had a Holter monitor in the past which she said was normal and an echocardiogram.  She tried several different medications for her heart rate including metoprolol and as needed clonidine.  She states that her heart rate would go up into the 200s with activity which has limited her ability to exercise.  As part of her workup for surgery she was found to have an undetectable TSH and ultimately was admitted to [facility] with thyrotoxicosis, diagnosed with Graves' disease, now status post thyroidectomy.  She believes ultimately that some of her heart rate symptoms had improved since treatment.  She was previously on propranolol for thyroid  storm, then ultimately return to usual metoprolol.  Echo was completed in July 2022 which showed normal left ventricular systolic function with mild LVH, trivial AR, MR, PR, and TR.  No valvular stenosis.  Normal right ventricular systolic function.    ROS: ***  Studies Reviewed     2d echo 04/12/2021 NTERPRETATION ---------------------------------------------------------------    NORMAL LEFT VENTRICULAR SYSTOLIC FUNCTION WITH MILD LVH     NORMAL LA PRESSURES WITH DIASTOLIC DYSFUNCTION    NORMAL RIGHT VENTRICULAR SYSTOLIC FUNCTION    VALVULAR REGURGITATION: TRIVIAL AR, TRIVIAL MR, TRIVIAL PR, TRIVIAL TR    NO VALVULAR STENOSIS    NO PRIOR STUDY FOR COMPARISON  Risk Assessment/Calculations   No BP recorded.  {Refresh Note OR Click here to enter BP  :1}***       Physical Exam VS:  There were no vitals taken for this visit.       Wt Readings from Last 3 Encounters:  06/29/24 200 lb (90.7 kg)  06/18/24 200 lb (90.7 kg)  04/01/24 230 lb (104.3 kg)    GEN: Well nourished, well developed in no acute distress NECK: No JVD; No carotid bruits CARDIAC: ***RRR, no murmurs, rubs, gallops RESPIRATORY:  Clear to auscultation without rales, wheezing or rhonchi  ABDOMEN: Soft, non-tender, non-distended EXTREMITIES:  No edema; No deformity   ASSESSMENT AND PLAN ***    {Are you ordering a CV Procedure (e.g. stress test, cath, DCCV, TEE, etc)?   Press F2        :[id number]}  Dispo: ***  Signed, Ena Demary, NP

## 2024-07-03 ENCOUNTER — Ambulatory Visit: Admitting: Medical

## 2024-07-04 ENCOUNTER — Other Ambulatory Visit (HOSPITAL_BASED_OUTPATIENT_CLINIC_OR_DEPARTMENT_OTHER): Payer: Self-pay

## 2024-07-04 MED ORDER — OXYCODONE-ACETAMINOPHEN 10-325 MG PO TABS
1.0000 | ORAL_TABLET | ORAL | 0 refills | Status: DC | PRN
  Filled 2024-07-04: qty 150, 25d supply, fill #0
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
## 2024-07-09 ENCOUNTER — Ambulatory Visit: Admitting: Orthopedic Surgery

## 2024-07-09 ENCOUNTER — Ambulatory Visit (INDEPENDENT_AMBULATORY_CARE_PROVIDER_SITE_OTHER)

## 2024-07-09 ENCOUNTER — Encounter: Payer: Self-pay | Admitting: Orthopedic Surgery

## 2024-07-09 VITALS — BP 130/80 | Wt 239.6 lb

## 2024-07-09 DIAGNOSIS — M5412 Radiculopathy, cervical region: Secondary | ICD-10-CM

## 2024-07-09 DIAGNOSIS — M546 Pain in thoracic spine: Secondary | ICD-10-CM

## 2024-07-09 DIAGNOSIS — Z981 Arthrodesis status: Secondary | ICD-10-CM

## 2024-07-09 DIAGNOSIS — M47812 Spondylosis without myelopathy or radiculopathy, cervical region: Secondary | ICD-10-CM

## 2024-07-09 DIAGNOSIS — M4722 Other spondylosis with radiculopathy, cervical region: Secondary | ICD-10-CM

## 2024-07-09 MED ORDER — CYCLOBENZAPRINE HCL 10 MG PO TABS: 10.0000 mg | ORAL_TABLET | Freq: Three times a day (TID) | ORAL | 0 refills | Status: AC | PRN

## 2024-07-09 NOTE — Patient Instructions (Addendum)
 It was so nice to see you today. Thank you so much for coming in.    Your neck xrays from ED showed some wear and tear.   I want to get an MRI of your neck to look into things further. We will get this approved through your insurance and [facility] will call you to schedule the appointment. Ask about your patient responsibility. You do not need to pay this prior to getting MRI, they can bill you.   Make sure you are scheduled for WIDE BORE MRI.   After you have the MRI, it can take 14-28 days for me to get the results back. If I don't have them in 2 weeks, we will call to try to get the results.   Once I have the results, we will call you to schedule a follow up MyChart visit with me to review them.   I want to get xrays of your mid back on your way out. Will message you with results.   Stop the methocarbamol . I sent a prescription for cyclobenzaprine to help with muscle spasms. Use only as needed and be careful, this can make you sleepy. Let Heather know about this before you get it.   If cyclobenzaprine does not help, you will need to discuss other options with pain management.   I would follow up with PCP regarding headaches.   Please do not hesitate to call if you have any questions or concerns. You can also message me in MyChart.   Glade Boys PA-C [phone]     The physicians and staff at [facility] at [facility] are committed to providing excellent care. You may receive a survey asking for feedback about your experience at our office. We value you your feedback and appreciate you taking the time to to fill it out. The [facility] leadership team is also available to discuss your experience in person, feel free to contact us  [phone].

## 2024-07-13 ENCOUNTER — Encounter: Payer: Self-pay | Admitting: Orthopedic Surgery

## 2024-07-13 DIAGNOSIS — M5412 Radiculopathy, cervical region: Secondary | ICD-10-CM

## 2024-07-13 DIAGNOSIS — M47812 Spondylosis without myelopathy or radiculopathy, cervical region: Secondary | ICD-10-CM

## 2024-07-13 DIAGNOSIS — Z981 Arthrodesis status: Secondary | ICD-10-CM

## 2024-07-13 NOTE — Telephone Encounter (Signed)
 Thoracic xrays dated 07/09/24:  FINDINGS: There is no evidence of thoracic spine fracture. Alignment is normal. Mild degenerative disc disease is noted in the mid and lower thoracic spine.   IMPRESSION: No acute findings. Mild degenerative disc disease.     Electronically Signed   By: Norleen DELENA Kil M.D.   On: 07/13/2024 09:42

## 2024-07-15 ENCOUNTER — Other Ambulatory Visit (HOSPITAL_BASED_OUTPATIENT_CLINIC_OR_DEPARTMENT_OTHER): Payer: Self-pay

## 2024-07-15 MED ORDER — TIZANIDINE HCL 4 MG PO TABS
4.0000 mg | ORAL_TABLET | Freq: Three times a day (TID) | ORAL | 0 refills | Status: AC | PRN
  Filled 2024-07-15: qty 90, 30d supply, fill #0

## 2024-07-15 MED ORDER — PREGABALIN 75 MG PO CAPS
75.0000 mg | ORAL_CAPSULE | Freq: Two times a day (BID) | ORAL | 0 refills | Status: AC
  Filled 2024-07-15: qty 60, 30d supply, fill #0

## 2024-07-15 MED ORDER — OXYCODONE-ACETAMINOPHEN 10-325 MG PO TABS
1.0000 | ORAL_TABLET | ORAL | 0 refills | Status: AC | PRN
  Filled 2024-07-15: qty 30, 5d supply, fill #0

## 2024-07-21 ENCOUNTER — Emergency Department
Admission: EM | Admit: 2024-07-21 | Discharge: 2024-07-22 | Disposition: A | Discharge status: Home / Self Care | Attending: Emergency Medicine | Admitting: Emergency Medicine

## 2024-07-21 ENCOUNTER — Other Ambulatory Visit: Payer: Self-pay

## 2024-07-21 DIAGNOSIS — R509 Fever, unspecified: Secondary | ICD-10-CM | POA: Diagnosis not present

## 2024-07-21 DIAGNOSIS — M542 Cervicalgia: Secondary | ICD-10-CM | POA: Diagnosis present

## 2024-07-21 DIAGNOSIS — E039 Hypothyroidism, unspecified: Secondary | ICD-10-CM | POA: Diagnosis not present

## 2024-07-21 DIAGNOSIS — I10 Essential (primary) hypertension: Secondary | ICD-10-CM | POA: Insufficient documentation

## 2024-07-21 DIAGNOSIS — Z859 Personal history of malignant neoplasm, unspecified: Secondary | ICD-10-CM | POA: Diagnosis not present

## 2024-07-21 DIAGNOSIS — M62838 Other muscle spasm: Secondary | ICD-10-CM | POA: Diagnosis not present

## 2024-07-21 MED ORDER — SODIUM CHLORIDE 0.9 % IV BOLUS (SEPSIS)
1000.0000 mL | Freq: Once | INTRAVENOUS | Status: AC
Start: 2024-07-21 — End: 2024-07-22
  Administered 2024-07-22: 1000 mL via INTRAVENOUS

## 2024-07-21 MED ORDER — KETOROLAC TROMETHAMINE 30 MG/ML IJ SOLN
30.0000 mg | Freq: Once | INTRAMUSCULAR | Status: AC
Start: 2024-07-21 — End: 2024-07-26
  Administered 2024-07-22: 30 mg via INTRAVENOUS
  Filled 2024-07-21 (×2): qty 1

## 2024-07-21 MED ORDER — HYDROMORPHONE HCL 1 MG/ML IJ SOLN
1.0000 mg | Freq: Once | INTRAMUSCULAR | Status: AC
  Administered 2024-07-22: 1 mg via INTRAVENOUS
  Filled 2024-07-21 (×2): qty 1
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
## 2024-07-22 ENCOUNTER — Emergency Department

## 2024-07-22 ENCOUNTER — Ambulatory Visit: Attending: Cardiology | Admitting: Cardiology

## 2024-07-22 LAB — CBC WITH DIFFERENTIAL/PLATELET
Abs Immature Granulocytes: 0.02 K/uL (ref 0.00–0.07)
Basophils Absolute: 0.1 K/uL (ref 0.0–0.1)
Basophils Relative: 1 %
Eosinophils Absolute: 0.1 K/uL (ref 0.0–0.5)
Eosinophils Relative: 1 %
HCT: 36.6 % (ref 36.0–46.0)
Hemoglobin: 11.9 g/dL — ABNORMAL LOW (ref 12.0–15.0)
Immature Granulocytes: 0 %
Lymphocytes Relative: 34 %
Lymphs Abs: 2.9 K/uL (ref 0.7–4.0)
MCH: 30.6 pg (ref 26.0–34.0)
MCHC: 32.5 g/dL (ref 30.0–36.0)
MCV: 94.1 fL (ref 80.0–100.0)
Monocytes Absolute: 0.5 K/uL (ref 0.1–1.0)
Monocytes Relative: 6 %
Neutro Abs: 5.1 K/uL (ref 1.7–7.7)
Neutrophils Relative %: 58 %
Platelets: 295 K/uL (ref 150–400)
RBC: 3.89 MIL/uL (ref 3.87–5.11)
RDW: 13.5 % (ref 11.5–15.5)
WBC: 8.7 K/uL (ref 4.0–10.5)
nRBC: 0 % (ref 0.0–0.2)

## 2024-07-22 LAB — COMPREHENSIVE METABOLIC PANEL WITH GFR
ALT: 34 U/L (ref 0–44)
AST: 32 U/L (ref 15–41)
Albumin: 3.8 g/dL (ref 3.5–5.0)
Alkaline Phosphatase: 69 U/L (ref 38–126)
Anion gap: 9 (ref 5–15)
BUN: 14 mg/dL (ref 6–20)
CO2: 27 mmol/L (ref 22–32)
Calcium: 7.8 mg/dL — ABNORMAL LOW (ref 8.9–10.3)
Chloride: 103 mmol/L (ref 98–111)
Creatinine, Ser: 0.8 mg/dL (ref 0.44–1.00)
GFR, Estimated: >60 mL/min (ref >60)
Glucose, Bld: 87 mg/dL (ref 70–99)
Potassium: 3.9 mmol/L (ref 3.5–5.1)
Sodium: 139 mmol/L (ref 135–145)
Total Bilirubin: 0.5 mg/dL (ref 0.0–1.2)
Total Protein: 7 g/dL (ref 6.5–8.1)

## 2024-07-22 LAB — RESP PANEL BY RT-PCR (RSV, FLU A&B, COVID)  RVPGX2
Influenza A by PCR: NEGATIVE
Influenza B by PCR: NEGATIVE
Resp Syncytial Virus by PCR: NEGATIVE
SARS Coronavirus 2 by RT PCR: NEGATIVE

## 2024-07-22 LAB — SEDIMENTATION RATE: Sed Rate: 24 mm/h — ABNORMAL HIGH (ref 0–20)

## 2024-07-22 LAB — C-REACTIVE PROTEIN: CRP: <0.5 mg/dL (ref <1.0)

## 2024-07-22 MED ORDER — LIDOCAINE 5 % EX PTCH: 1.0000 | MEDICATED_PATCH | CUTANEOUS | 0 refills | Status: AC

## 2024-07-22 MED ORDER — HYDROMORPHONE HCL 1 MG/ML IJ SOLN
1.0000 mg | Freq: Once | INTRAMUSCULAR | Status: AC
Start: 2024-07-22 — End: 2024-07-22
  Administered 2024-07-22: 1 mg via INTRAMUSCULAR
  Filled 2024-07-22: qty 1

## 2024-07-22 MED ORDER — GADOBUTROL 1 MMOL/ML IV SOLN
10.0000 mL | Freq: Once | INTRAVENOUS | Status: AC | PRN
  Administered 2024-07-22: 10 mL via INTRAVENOUS

## 2024-07-22 MED ORDER — DROPERIDOL 2.5 MG/ML IJ SOLN
2.5000 mg | Freq: Once | INTRAMUSCULAR | Status: AC
Start: 2024-07-22 — End: 2024-07-22
  Administered 2024-07-22: 2.5 mg via INTRAVENOUS
  Filled 2024-07-22: qty 2

## 2024-07-22 MED ORDER — LORAZEPAM 2 MG/ML IJ SOLN
1.0000 mg | Freq: Once | INTRAMUSCULAR | Status: AC
  Administered 2024-07-22: 1 mg via INTRAVENOUS
  Filled 2024-07-22: qty 1

## 2024-07-22 MED ORDER — LORAZEPAM 2 MG/ML IJ SOLN
1.0000 mg | Freq: Once | INTRAMUSCULAR | Status: AC | PRN
  Administered 2024-07-22: 1 mg via INTRAVENOUS
  Filled 2024-07-22: qty 1

## 2024-07-22 MED ORDER — LIDOCAINE 5 % EX PTCH
1.0000 | MEDICATED_PATCH | CUTANEOUS | Status: DC
  Administered 2024-07-22: 1 via TRANSDERMAL
  Filled 2024-07-22: qty 1

## 2024-07-22 NOTE — Telephone Encounter (Signed)
 Noted. I have canceled the one that was order by [facility].

## 2024-07-22 NOTE — ED Notes (Signed)
 Just got back from MRI with this pt. Pt resting. Equal, unlabored respirations. 100% on 2L [place]. Report given to Harlene, [place]
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
## 2024-07-22 NOTE — ED Notes (Signed)
 Attempted to get pt to urinate. Pt couldn't pee yet

## 2024-07-22 NOTE — ED Notes (Signed)
 Pt placed on 2L [place] for transport to MRI due to getting several pain medications

## 2024-07-22 NOTE — ED Notes (Addendum)
 This RN attempted US  IV access without success. Vein at 1 depth on US  IV, patient then yelled at this RN stating you struck a nerve and my whole arm went numb, removed needle and IV. Depth never went past 1 on needle finder, short 1.16 in 18G IV used for attempt. Primary RN and Consulting Civil Engineer aware.

## 2024-07-22 NOTE — Telephone Encounter (Signed)
 Cervical MRI dated 07/22/24:   FINDINGS:   BONES AND ALIGNMENT: Reversal of the normal cervical lordosis. Status post ACDF at C3-C4 with completed fusion. Normal vertebral body heights. Bone marrow signal is unremarkable.   SPINAL CORD: Normal spinal cord size. No abnormal spinal cord signal. There is no abnormal enhancement.   SOFT TISSUES: No paraspinal mass.   C2-C3: No significant disc herniation. No spinal canal stenosis or neural foraminal narrowing.   C3-C4: Status post ACDF with completed fusion. The spinal canal and neural foramina are widely patent at the operative level.   C4-C5: No significant disc herniation. No spinal canal stenosis or neural foraminal narrowing.   C5-C6: Minimal posterior disc bulging. No significant spinal canal or neural foraminal stenosis.   C6-C7: No significant disc herniation. No spinal canal stenosis or neural foraminal narrowing.   C7-T1: No significant disc herniation. No spinal canal stenosis or neural foraminal narrowing.   IMPRESSION: 1. Status post ACDF at C3-4 with completed fusion. The spinal canal and neural foramina are widely patent at the operative level. 2. Minimal posterior disc bulging at C5-6 without significant spinal canal or neural foraminal stenosis.   Electronically signed by: Evalene Coho MD 07/22/2024 04:22 AM EDT RP Workstation: [id number]  I have personally reviewed the images and agree with the above interpretation.

## 2024-07-22 NOTE — ED Notes (Signed)
 This RN attempted 2x to obtain IV access unsuccessfully.

## 2024-07-23 ENCOUNTER — Other Ambulatory Visit (HOSPITAL_BASED_OUTPATIENT_CLINIC_OR_DEPARTMENT_OTHER): Payer: Self-pay

## 2024-07-23 MED ORDER — WEGOVY 0.25 MG/0.5ML ~~LOC~~ SOAJ
0.2500 mg | SUBCUTANEOUS | 0 refills | Status: AC
  Filled 2024-07-23: qty 2, 28d supply, fill #0

## 2024-07-23 NOTE — Telephone Encounter (Signed)
 Please schedule her a follow up with me at my next available.

## 2024-07-23 NOTE — Addendum Note (Signed)
 Addended byBETHA HILMA HASTINGS on: 07/23/2024 09:58 AM   Modules accepted: Orders
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
## 2024-07-28 ENCOUNTER — Encounter: Payer: Self-pay | Admitting: Cardiology

## 2024-07-29 ENCOUNTER — Encounter: Payer: Self-pay | Admitting: Emergency Medicine

## 2024-07-29 ENCOUNTER — Emergency Department
Admission: EM | Admit: 2024-07-29 | Discharge: 2024-07-29 | Disposition: A | Discharge status: Home / Self Care | Attending: Emergency Medicine | Admitting: Emergency Medicine

## 2024-07-29 ENCOUNTER — Other Ambulatory Visit: Payer: Self-pay

## 2024-07-29 DIAGNOSIS — M542 Cervicalgia: Secondary | ICD-10-CM | POA: Diagnosis present

## 2024-07-29 DIAGNOSIS — G8929 Other chronic pain: Secondary | ICD-10-CM

## 2024-07-29 DIAGNOSIS — M5412 Radiculopathy, cervical region: Secondary | ICD-10-CM | POA: Diagnosis not present

## 2024-07-29 MED ORDER — KETAMINE HCL 100 MG/ML IJ SOLN
1.5000 mg/kg | Freq: Once | INTRAMUSCULAR | Status: AC
  Administered 2024-07-29: 140 mg via INTRAMUSCULAR
  Filled 2024-07-29: qty 1.4

## 2024-07-29 MED ORDER — KETAMINE HCL 50 MG/5ML IJ SOSY: 1.5000 mg/kg | PREFILLED_SYRINGE | Freq: Once | INTRAMUSCULAR | Status: DC

## 2024-07-29 NOTE — ED Notes (Addendum)
 I was asked by Dr. Gordan to help assist patient as she had been refusing to leave. Dr. Gordan explained patients concerns but stated that patient is medically ready for discharge. When I went into room pt was resting with eyes closed but was arousable. Pt continued to state that she had neck pain but was able to rest without noted distress when not engaged by staff. I assisted patient to wheelchair and patient was able to bear weight on legs. Husband met me outside ED with car, where he was able to assist her. Pt again was able to stand and sit in car.
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
## 2024-07-29 NOTE — ED Notes (Signed)
 While attempting to discharge pt, Pt was assisted to sit up and began to scream Ow my God Damn neck and began to cry. Pt bed lowered back down. Pt remains on SpO2 and BP cuff.

## 2024-07-29 NOTE — Discharge Instructions (Addendum)
 As we discussed, we tried a medication called ketamine to get you some relief, but we are not limited in our ability to provide additional management given that there is no clear cause of your pain at this time.  We recommend you take the medications you have been prescribed previously and follow-up with your regular providers.  We also provided the names and contact information for several different other options including the [facility] pain clinic and Dr. Avanell who can also do injections to help with acute on chronic pain.

## 2024-07-29 NOTE — ED Notes (Signed)
 Writer requested to speak with pts spouse. Pts spouse request that EVS not come clean around pt because the sound of machine will make her vomit. Astronomer for the inconvenience but informs that EVS has a responsibility to complete floor cleaning. Writer informs that pt can be moved into a room once another pt is discharged out.

## 2024-07-29 NOTE — ED Notes (Signed)
 PT in no acute distress prior to discharge. Discharged instructions reviewed, all questions answered and pt verbalized understanding at this time. Pt has all belongings with them at time of discharge.
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
## 2024-07-29 NOTE — ED Notes (Signed)
 Pt's husband requesting that pt be moved to room from hallway. RN informed pt's husband that request will be brought to charge nurse.

## 2024-07-29 NOTE — ED Notes (Signed)
 [name] RN at bedside to verify medication dose and administration.

## 2024-07-30 ENCOUNTER — Ambulatory Visit: Admitting: Orthopedic Surgery

## 2024-08-01 ENCOUNTER — Other Ambulatory Visit (HOSPITAL_BASED_OUTPATIENT_CLINIC_OR_DEPARTMENT_OTHER): Payer: Self-pay

## 2024-08-01 MED ORDER — OXYCODONE-ACETAMINOPHEN 10-325 MG PO TABS
1.0000 | ORAL_TABLET | ORAL | 0 refills | Status: DC
  Filled 2024-08-01: qty 180, 30d supply, fill #0

## 2024-08-01 MED ORDER — PREGABALIN 150 MG PO CAPS: 150.0000 mg | ORAL_CAPSULE | Freq: Two times a day (BID) | ORAL | 0 refills | Status: AC

## 2024-08-07 ENCOUNTER — Ambulatory Visit: Payer: Self-pay | Admitting: Nurse Practitioner
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
## 2024-08-27 ENCOUNTER — Ambulatory Visit: Attending: Physician Assistant | Admitting: Physician Assistant

## 2024-08-28 ENCOUNTER — Encounter: Payer: Self-pay | Admitting: Physician Assistant
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
## 2024-08-30 ENCOUNTER — Other Ambulatory Visit (HOSPITAL_BASED_OUTPATIENT_CLINIC_OR_DEPARTMENT_OTHER): Payer: Self-pay

## 2024-08-30 MED ORDER — OXYCODONE-ACETAMINOPHEN 10-325 MG PO TABS
1.0000 | ORAL_TABLET | ORAL | 0 refills | Status: DC
  Filled 2024-08-30: qty 180, 30d supply, fill #0

## 2024-09-02 ENCOUNTER — Ambulatory Visit: Admitting: Orthopedic Surgery

## 2024-09-21 ENCOUNTER — Other Ambulatory Visit: Payer: Self-pay | Admitting: Nurse Practitioner

## 2024-09-22 NOTE — Telephone Encounter (Signed)
 Left message to call the office back due to Patient has not been seen in over a year so she needs an office visit with Chelsea [facility].

## 2024-09-23 NOTE — Telephone Encounter (Signed)
 Patient states she is not a Patient in this office any more.

## 2024-09-26 ENCOUNTER — Other Ambulatory Visit (HOSPITAL_BASED_OUTPATIENT_CLINIC_OR_DEPARTMENT_OTHER): Payer: Self-pay

## 2024-09-26 MED ORDER — OXYCODONE-ACETAMINOPHEN 10-325 MG PO TABS
1.0000 | ORAL_TABLET | ORAL | 0 refills | Status: DC
  Filled 2024-09-27 (×2): qty 180, 30d supply, fill #0

## 2024-09-26 MED ORDER — LEVOTHYROXINE SODIUM 150 MCG PO TABS
150.0000 ug | ORAL_TABLET | Freq: Every day | ORAL | 0 refills | Status: DC
  Filled 2024-09-26: qty 30, 30d supply, fill #0

## 2024-09-27 ENCOUNTER — Other Ambulatory Visit: Payer: Self-pay

## 2024-09-27 ENCOUNTER — Other Ambulatory Visit (HOSPITAL_BASED_OUTPATIENT_CLINIC_OR_DEPARTMENT_OTHER): Payer: Self-pay
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
## 2024-10-01 ENCOUNTER — Ambulatory Visit: Attending: Orthopedic Surgery

## 2024-10-08 ENCOUNTER — Ambulatory Visit

## 2024-10-15 ENCOUNTER — Ambulatory Visit

## 2024-10-21 ENCOUNTER — Ambulatory Visit

## 2024-10-22 ENCOUNTER — Other Ambulatory Visit (HOSPITAL_BASED_OUTPATIENT_CLINIC_OR_DEPARTMENT_OTHER): Payer: Self-pay

## 2024-10-22 MED ORDER — LEVOTHYROXINE SODIUM 150 MCG PO TABS
150.0000 ug | ORAL_TABLET | Freq: Every day | ORAL | 0 refills | Status: AC
  Filled 2024-10-22 – 2024-10-23 (×2): qty 30, 30d supply, fill #0

## 2024-10-22 MED ORDER — OXYCODONE-ACETAMINOPHEN 10-325 MG PO TABS
1.0000 | ORAL_TABLET | ORAL | 0 refills | Status: AC
  Filled 2024-10-24 (×2): qty 180, 30d supply, fill #0
  Filled ????-??-??: fill #0

## 2024-10-23 ENCOUNTER — Ambulatory Visit

## 2024-10-23 ENCOUNTER — Other Ambulatory Visit: Payer: Self-pay

## 2024-10-23 ENCOUNTER — Other Ambulatory Visit (HOSPITAL_BASED_OUTPATIENT_CLINIC_OR_DEPARTMENT_OTHER): Payer: Self-pay

## 2024-10-24 ENCOUNTER — Emergency Department

## 2024-10-24 ENCOUNTER — Other Ambulatory Visit: Payer: Self-pay

## 2024-10-24 ENCOUNTER — Other Ambulatory Visit (HOSPITAL_BASED_OUTPATIENT_CLINIC_OR_DEPARTMENT_OTHER): Payer: Self-pay

## 2024-10-24 DIAGNOSIS — R519 Headache, unspecified: Secondary | ICD-10-CM | POA: Insufficient documentation

## 2024-10-24 DIAGNOSIS — E039 Hypothyroidism, unspecified: Secondary | ICD-10-CM | POA: Insufficient documentation

## 2024-10-24 DIAGNOSIS — R079 Chest pain, unspecified: Secondary | ICD-10-CM | POA: Diagnosis present

## 2024-10-24 DIAGNOSIS — R32 Unspecified urinary incontinence: Secondary | ICD-10-CM | POA: Insufficient documentation

## 2024-10-24 DIAGNOSIS — I10 Essential (primary) hypertension: Secondary | ICD-10-CM | POA: Diagnosis not present

## 2024-10-24 DIAGNOSIS — R0789 Other chest pain: Secondary | ICD-10-CM | POA: Diagnosis not present

## 2024-10-24 LAB — CBC
HCT: 38 % (ref 36.0–46.0)
Hemoglobin: 12.1 g/dL (ref 12.0–15.0)
MCH: 31.4 pg (ref 26.0–34.0)
MCHC: 31.8 g/dL (ref 30.0–36.0)
MCV: 98.7 fL (ref 80.0–100.0)
Platelets: 334 10*3/uL (ref 150–400)
RBC: 3.85 MIL/uL — ABNORMAL LOW (ref 3.87–5.11)
RDW: 13.2 % (ref 11.5–15.5)
WBC: 9.5 10*3/uL (ref 4.0–10.5)
nRBC: 0 % (ref 0.0–0.2)

## 2024-10-24 LAB — BASIC METABOLIC PANEL WITH GFR
Anion gap: 12 (ref 5–15)
BUN: 15 mg/dL (ref 6–20)
CO2: 25 mmol/L (ref 22–32)
Calcium: 7.7 mg/dL — ABNORMAL LOW (ref 8.9–10.3)
Chloride: 105 mmol/L (ref 98–111)
Creatinine, Ser: 0.82 mg/dL (ref 0.44–1.00)
GFR, Estimated: >60 mL/min
Glucose, Bld: 87 mg/dL (ref 70–99)
Potassium: 4 mmol/L (ref 3.5–5.1)
Sodium: 141 mmol/L (ref 135–145)

## 2024-10-24 LAB — TROPONIN T, HIGH SENSITIVITY: Troponin T High Sensitivity: 10 ng/L (ref 0–19)

## 2024-10-24 NOTE — ED Triage Notes (Signed)
 Pt to ed from home via POV for CP. Pt is very anxious and unable to sit still for clear EKG. Pt is caox4 and upset in triage.

## 2024-10-25 ENCOUNTER — Emergency Department

## 2024-10-25 ENCOUNTER — Other Ambulatory Visit: Payer: Self-pay

## 2024-10-25 ENCOUNTER — Emergency Department: Admission: EM | Admit: 2024-10-25 | Discharge: 2024-10-25 | Disposition: A

## 2024-10-25 DIAGNOSIS — R0789 Other chest pain: Secondary | ICD-10-CM

## 2024-10-25 DIAGNOSIS — R32 Unspecified urinary incontinence: Secondary | ICD-10-CM

## 2024-10-25 DIAGNOSIS — R519 Headache, unspecified: Secondary | ICD-10-CM

## 2024-10-25 LAB — D-DIMER, QUANTITATIVE: D-Dimer, Quant: 0.32 ug{FEU}/mL (ref 0.00–0.50)

## 2024-10-25 LAB — TROPONIN T, HIGH SENSITIVITY: Troponin T High Sensitivity: <6 ng/L (ref 0–19)

## 2024-10-25 MED ORDER — IBUPROFEN 600 MG PO TABS
600.0000 mg | ORAL_TABLET | Freq: Once | ORAL | Status: AC
  Administered 2024-10-25: 600 mg via ORAL
  Filled 2024-10-25: qty 1

## 2024-10-25 MED ORDER — DIAZEPAM 5 MG PO TABS
5.0000 mg | ORAL_TABLET | Freq: Once | ORAL | Status: AC
  Administered 2024-10-25: 5 mg via ORAL
  Filled 2024-10-25: qty 1

## 2024-10-25 MED ORDER — SODIUM CHLORIDE 0.9 % IV SOLN
12.5000 mg | Freq: Once | INTRAVENOUS | Status: AC
  Administered 2024-10-25: 12.5 mg via INTRAVENOUS
  Filled 2024-10-25: qty 12.5

## 2024-10-25 MED ORDER — LORAZEPAM 1 MG PO TABS
1.0000 mg | ORAL_TABLET | Freq: Once | ORAL | Status: AC
  Administered 2024-10-25: 1 mg via ORAL
  Filled 2024-10-25: qty 1

## 2024-10-25 MED ORDER — FENTANYL CITRATE (PF) 50 MCG/ML IJ SOSY
50.0000 ug | PREFILLED_SYRINGE | Freq: Once | INTRAMUSCULAR | Status: AC
  Administered 2024-10-25: 50 ug via INTRAVENOUS
  Filled 2024-10-25: qty 1

## 2024-10-25 MED ORDER — DROPERIDOL 2.5 MG/ML IJ SOLN
1.2500 mg | Freq: Once | INTRAMUSCULAR | Status: AC
  Administered 2024-10-25: 1.25 mg via INTRAVENOUS
  Filled 2024-10-25: qty 2

## 2024-10-25 MED ORDER — SODIUM CHLORIDE 0.9 % IV BOLUS
1000.0000 mL | Freq: Once | INTRAVENOUS | Status: AC
  Administered 2024-10-25: 1000 mL via INTRAVENOUS

## 2024-10-25 MED ORDER — ONDANSETRON HCL 4 MG/2ML IJ SOLN
4.0000 mg | Freq: Once | INTRAMUSCULAR | Status: AC
  Administered 2024-10-25: 4 mg via INTRAVENOUS
  Filled 2024-10-25: qty 2

## 2024-10-25 MED ORDER — MECLIZINE HCL 25 MG PO TABS
25.0000 mg | ORAL_TABLET | Freq: Once | ORAL | Status: AC
  Administered 2024-10-25: 25 mg via ORAL
  Filled 2024-10-25: qty 1

## 2024-10-25 MED ORDER — ACETAMINOPHEN 325 MG PO TABS
650.0000 mg | ORAL_TABLET | Freq: Once | ORAL | Status: AC
  Administered 2024-10-25: 650 mg via ORAL
  Filled 2024-10-25: qty 2

## 2024-10-25 NOTE — ED Provider Notes (Signed)
" °  Physical Exam  BP 112/73 (BP Location: Right Arm)   Pulse 76   Temp 97.9 F (36.6 C) (Oral)   Resp 14   Ht 5' 9 (1.753 m)   SpO2 95%   BMI 30.93 kg/m   Physical Exam  Procedures  Procedures  ED Course / MDM    Medical Decision Making Amount and/or Complexity of Data Reviewed Labs: ordered. Radiology: ordered.  Risk OTC drugs. Prescription drug management.   This patient's care was signed out to me at shift change pending completion of CT imaging.  Patient initially presented for chest pain, headache, and urinary incontinence.  Her chest pain is resolved she is still complaining of a headache as well as dizziness when she lies down.  Patient is extremely anxious and very tearful. Patient did require multiple medications to be able to lie down for the CT imaging.  After treatment with droperidol  the patient was able to tolerate CT imaging which was unremarkable.  I discussed results with the patient.  She reports that she is feeling improved at this time.  She has had no further episodes of urinary incontinence and has been able to use the bathroom.  We discussed the plan for follow-up with her primary care physician.  She will return to the emergency department for any new or worsening symptoms.       Rexford Reche HERO, MD 10/25/24 1324  "
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
## 2024-10-25 NOTE — ED Notes (Signed)
 Patient to CT

## 2024-10-25 NOTE — ED Notes (Signed)
 Called CT to notify.

## 2024-10-27 ENCOUNTER — Encounter: Payer: Self-pay | Admitting: Emergency Medicine

## 2024-10-27 ENCOUNTER — Emergency Department: Admission: EM | Admit: 2024-10-27 | Discharge: 2024-10-27 | Disposition: A | Discharge status: Home / Self Care

## 2024-10-27 ENCOUNTER — Emergency Department

## 2024-10-27 ENCOUNTER — Other Ambulatory Visit: Payer: Self-pay

## 2024-10-27 DIAGNOSIS — R079 Chest pain, unspecified: Secondary | ICD-10-CM

## 2024-10-27 DIAGNOSIS — I10 Essential (primary) hypertension: Secondary | ICD-10-CM | POA: Insufficient documentation

## 2024-10-27 DIAGNOSIS — F419 Anxiety disorder, unspecified: Secondary | ICD-10-CM | POA: Insufficient documentation

## 2024-10-27 DIAGNOSIS — E039 Hypothyroidism, unspecified: Secondary | ICD-10-CM | POA: Insufficient documentation

## 2024-10-27 LAB — BASIC METABOLIC PANEL WITH GFR
Anion gap: 13 (ref 5–15)
BUN: 15 mg/dL (ref 6–20)
CO2: 24 mmol/L (ref 22–32)
Calcium: 8.2 mg/dL — ABNORMAL LOW (ref 8.9–10.3)
Chloride: 103 mmol/L (ref 98–111)
Creatinine, Ser: 1.03 mg/dL — ABNORMAL HIGH (ref 0.44–1.00)
GFR, Estimated: >60 mL/min
Glucose, Bld: 102 mg/dL — ABNORMAL HIGH (ref 70–99)
Potassium: 4.6 mmol/L (ref 3.5–5.1)
Sodium: 140 mmol/L (ref 135–145)

## 2024-10-27 LAB — CBC
HCT: 35.9 % — ABNORMAL LOW (ref 36.0–46.0)
Hemoglobin: 11.5 g/dL — ABNORMAL LOW (ref 12.0–15.0)
MCH: 31.3 pg (ref 26.0–34.0)
MCHC: 32 g/dL (ref 30.0–36.0)
MCV: 97.6 fL (ref 80.0–100.0)
Platelets: 336 10*3/uL (ref 150–400)
RBC: 3.68 MIL/uL — ABNORMAL LOW (ref 3.87–5.11)
RDW: 12.7 % (ref 11.5–15.5)
WBC: 9.3 10*3/uL (ref 4.0–10.5)
nRBC: 0 % (ref 0.0–0.2)

## 2024-10-27 LAB — TROPONIN T, HIGH SENSITIVITY
Troponin T High Sensitivity: <6 ng/L (ref 0–19)
Troponin T High Sensitivity: <6 ng/L (ref 0–19)

## 2024-10-27 MED ORDER — FENTANYL CITRATE (PF) 50 MCG/ML IJ SOSY
50.0000 ug | PREFILLED_SYRINGE | Freq: Once | INTRAMUSCULAR | Status: AC
  Administered 2024-10-27: 50 ug via INTRAVENOUS
  Filled 2024-10-27: qty 1

## 2024-10-27 MED ORDER — KETOROLAC TROMETHAMINE 30 MG/ML IJ SOLN
30.0000 mg | Freq: Once | INTRAMUSCULAR | Status: AC
  Administered 2024-10-27: 30 mg via INTRAVENOUS
  Filled 2024-10-27: qty 1

## 2024-10-27 MED ORDER — IOHEXOL 350 MG/ML SOLN
100.0000 mL | Freq: Once | INTRAVENOUS | Status: AC | PRN
  Administered 2024-10-27: 100 mL via INTRAVENOUS

## 2024-10-27 MED ORDER — ACETAMINOPHEN 325 MG PO TABS
650.0000 mg | ORAL_TABLET | Freq: Once | ORAL | Status: DC
  Filled 2024-10-27: qty 2

## 2024-10-27 MED ORDER — LISINOPRIL 10 MG PO TABS: 10.0000 mg | ORAL_TABLET | Freq: Every day | ORAL | 0 refills | Status: AC

## 2024-10-27 MED ORDER — DIPHENHYDRAMINE HCL 50 MG/ML IJ SOLN
25.0000 mg | Freq: Once | INTRAMUSCULAR | Status: AC
  Administered 2024-10-27: 25 mg via INTRAVENOUS
  Filled 2024-10-27: qty 1

## 2024-10-27 MED ORDER — HYDROXYZINE PAMOATE 25 MG PO CAPS: 25.0000 mg | ORAL_CAPSULE | Freq: Two times a day (BID) | ORAL | 0 refills | Status: AC

## 2024-10-27 MED ORDER — METOCLOPRAMIDE HCL 5 MG/ML IJ SOLN
5.0000 mg | Freq: Once | INTRAMUSCULAR | Status: AC
  Administered 2024-10-27: 5 mg via INTRAVENOUS
  Filled 2024-10-27: qty 2

## 2024-10-27 MED ORDER — ONDANSETRON HCL 4 MG/2ML IJ SOLN
4.0000 mg | Freq: Once | INTRAMUSCULAR | Status: AC
  Administered 2024-10-27: 4 mg via INTRAVENOUS
  Filled 2024-10-27: qty 2

## 2024-10-27 MED ORDER — SODIUM CHLORIDE 0.9 % IV BOLUS
1000.0000 mL | Freq: Once | INTRAVENOUS | Status: AC
  Administered 2024-10-27: 1000 mL via INTRAVENOUS
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
## 2024-10-27 NOTE — ED Notes (Signed)
 Harlene, EDT and Candace, RN both attempted to get blood from pt. Unable to obtain blood work.

## 2024-10-27 NOTE — ED Notes (Signed)
 Attempted IV x1, unsucessful

## 2024-10-27 NOTE — Discharge Instructions (Signed)
 Please take medications as prescribed.  Please be sure to check your blood pressure prior to taking lisinopril .  If your blood pressure is below 120/80.  Please do not take the medication.  Please follow-up with your primary care physician as well as with cardiology for further evaluation of your symptoms.  Return to the emergency department for new or worsening symptoms.

## 2024-10-28 ENCOUNTER — Ambulatory Visit

## 2024-10-30 ENCOUNTER — Ambulatory Visit

## 2024-11-04 ENCOUNTER — Ambulatory Visit

## 2024-11-06 ENCOUNTER — Ambulatory Visit

## 2024-11-11 ENCOUNTER — Ambulatory Visit

## 2024-11-13 ENCOUNTER — Ambulatory Visit

## 2024-11-17 ENCOUNTER — Ambulatory Visit

## 2024-11-19 ENCOUNTER — Ambulatory Visit
